# Patient Record
Sex: Male | Born: 1950 | Race: Asian | Hispanic: No | Marital: Married | Smoking: Current every day smoker
Health system: Southern US, Community
[De-identification: ages and names within clinical notes are randomized; demographics above are authoritative.]

## PROBLEM LIST (undated history)

## (undated) DIAGNOSIS — E785 Hyperlipidemia, unspecified: Secondary | ICD-10-CM

## (undated) DIAGNOSIS — I10 Essential (primary) hypertension: Secondary | ICD-10-CM

---

## 2010-02-05 ENCOUNTER — Encounter: Payer: Self-pay | Admitting: Gastroenterology

## 2010-03-01 ENCOUNTER — Encounter (INDEPENDENT_AMBULATORY_CARE_PROVIDER_SITE_OTHER): Payer: Self-pay | Admitting: *Deleted

## 2010-03-09 ENCOUNTER — Ambulatory Visit: Payer: Self-pay | Admitting: Gastroenterology

## 2010-03-19 ENCOUNTER — Ambulatory Visit: Payer: Self-pay | Admitting: Gastroenterology

## 2010-05-29 NOTE — Miscellaneous (Signed)
Summary: previsit prep/rm  Clinical Lists Changes  Medications: Added new medication of MOVIPREP 100 GM  SOLR (PEG-KCL-NACL-NASULF-NA ASC-C) As per prep instructions. - Signed Rx of MOVIPREP 100 GM  SOLR (PEG-KCL-NACL-NASULF-NA ASC-C) As per prep instructions.;  #1 x 0;  Signed;  Entered by: Sherren Kerns RN;  Authorized by: Mardella Layman MD [facility];  Method used: Electronically to [id number]*, [address] [place], [place], [place]  [address], Ph: [phone], Fax: [phone] Observations: Added new observation of ALLERGY REV: Done (03/09/2010 8:55) Added new observation of NKA: T (03/09/2010 8:55)    Prescriptions: MOVIPREP 100 GM  SOLR (PEG-KCL-NACL-NASULF-NA ASC-C) As per prep instructions.  #1 x 0   Entered by:   Sherren Kerns RN   Authorized by:   Mardella Layman MD [facility]   Signed by:   Sherren Kerns RN on 03/09/2010   Method used:   Electronically to        [id number]* (retail)       [address]       [place], [place]  [address]       Ph: [phone]       Fax: [phone]   RxID:   [id number]

## 2010-05-29 NOTE — Letter (Signed)
Summary: Moviprep Instructions  [facility]  [address]   [place], [place] [address]   Phone: [phone]  Fax: [phone]       [facility]    [birth year]    MRN: [id number]        Procedure Day /Date: Monday, 03-19-10     Arrival Time: 7:30 a.m.     Procedure Time: 8:30 a.m.     Location of Procedure:                    x   [facility] (4th Floor)                        PREPARATION FOR COLONOSCOPY WITH MOVIPREP   Starting 5 days prior to your procedure  03-14-10 do not eat nuts, seeds, popcorn, corn, beans, peas,  salads, or any raw vegetables.  Do not take any fiber supplements (e.g. Metamucil, Citrucel, and Benefiber).  THE DAY BEFORE YOUR PROCEDURE         DATE: 03-18-10   DAY: Sunday 1.  Drink clear liquids the entire day-NO SOLID FOOD  2.  Do not drink anything colored red or purple.  Avoid juices with pulp.  No orange juice.  3.  Drink at least 64 oz. (8 glasses) of fluid/clear liquids during the day to prevent dehydration and help the prep work efficiently.  CLEAR LIQUIDS INCLUDE: Water Jello Ice Popsicles Tea (sugar ok, no milk/cream) Powdered fruit flavored drinks Coffee (sugar ok, no milk/cream) Gatorade Juice: apple, white grape, white cranberry  Lemonade Clear bullion, consomm, broth Carbonated beverages (any kind) Strained chicken noodle soup Hard Candy                             4.  In the morning, mix first dose of MoviPrep solution:    Empty 1 Pouch A and 1 Pouch B into the disposable container    Add lukewarm drinking water to the top line of the container. Mix to dissolve    Refrigerate (mixed solution should be used within 24 hrs)  5.  Begin drinking the prep at 5:00 p.m. The MoviPrep container is divided by 4 marks.   Every 15 minutes drink the solution down to the next mark (approximately 8 oz) until the full liter is complete.   6.  Follow completed prep with 16 oz of clear liquid of your choice  (Nothing red or purple).  Continue to drink clear liquids until bedtime.  7.  Before going to bed, mix second dose of MoviPrep solution:    Empty 1 Pouch A and 1 Pouch B into the disposable container    Add lukewarm drinking water to the top line of the container. Mix to dissolve    Refrigerate  THE DAY OF YOUR PROCEDURE      DATE: 03-19-10  DAY: Monday  Beginning at 3:30 a.m. (5 hours before procedure):         1. Every 15 minutes, drink the solution down to the next mark (approx 8 oz) until the full liter is complete.  2. Follow completed prep with 16 oz. of clear liquid of your choice.    3. You may drink clear liquids until 6:30 a.m. (2 HOURS BEFORE PROCEDURE).   MEDICATION INSTRUCTIONS  Unless otherwise instructed, you should take regular prescription medications with a small sip of water   as early as possible the  morning of your procedure.         OTHER INSTRUCTIONS  You will need a responsible adult at least 62 years of age to accompany you and drive you home.   This person must remain in the waiting room during your procedure.  Wear loose fitting clothing that is easily removed.  Leave jewelry and other valuables at home.  However, you may wish to bring a book to read or  an iPod/MP3 player to listen to music as you wait for your procedure to start.  Remove all body piercing jewelry and leave at home.  Total time from sign-in until discharge is approximately 2-3 hours.  You should go home directly after your procedure and rest.  You can resume normal activities the  day after your procedure.  The day of your procedure you should not:   Drive   Make legal decisions   Operate machinery   Drink alcohol   Return to work  You will receive specific instructions about eating, activities and medications before you leave.    The above instructions have been reviewed and explained to me by   Sherren Kerns RN  March 09, 2010 9:15 AM    I fully  understand and can verbalize these instructions _____________________________ Date _________

## 2010-05-29 NOTE — Procedures (Signed)
Summary: Colonoscopy  Patient: [name] Note: All result statuses are Final unless otherwise noted.  Tests: (1) Colonoscopy (COL)   COL Colonoscopy           DONE     [facility]     [address]     [place], [place]  [address]           COLONOSCOPY PROCEDURE REPORT           PATIENT:  Frederick Andrews, Frederick Andrews  MR#:  [id number]     BIRTHDATE:  1951/01/07, 58 yrs. old  GENDER:  male     ENDOSCOPIST:  Vania Rea. Jarold Motto, MD, [facility]     REF. BY:  Derrek Gu, M.D.     PROCEDURE DATE:  03/19/2010     PROCEDURE:  Colonoscopy with snare polypectomy     ASA CLASS:  Class I     INDICATIONS:  Routine Risk Screening     MEDICATIONS:   Fentanyl 50 mcg, Versed 4 mg           DESCRIPTION OF PROCEDURE:   After the risks benefits and     alternatives of the procedure were thoroughly explained, informed     consent was obtained.  Digital rectal exam was performed and     revealed no abnormalities.   The LB CF-[id number] endoscope     was introduced through the anus and advanced to the cecum, which     was identified by both the appendix and ileocecal valve, without     limitations.  The quality of the prep was excellent, using     MoviPrep.  The instrument was then slowly withdrawn as the colon     was fully examined.     <<PROCEDUREIMAGES>>           FINDINGS:  A sessile polyp was found. 3 mm polyp cold snare     excised.  This was otherwise a normal examination of the colon.     Retroflexed views in the rectum revealed no abnormalities.    The     scope was then withdrawn from the patient and the procedure     completed.           COMPLICATIONS:  None     ENDOSCOPIC IMPRESSION:     1) Sessile polyp     2) Otherwise normal examination     PROBABLE ADENOMA.     RECOMMENDATIONS:     1) Repeat Colonoscopy in 5 years.     REPEAT EXAM:  No           ______________________________     Vania Rea. Jarold Motto, MD, [facility]           CC:           n.     eSIGNED:   Vania Rea. Patterson at  03/19/2010 09:02 AM           Sela Hua, [phone]  Note: An exclamation mark (!) indicates a result that was not dispersed into the flowsheet. Document Creation Date: 03/19/2010 9:02 AM _______________________________________________________________________  (1) Order result status: Final Collection or observation date-time: 03/19/2010 08:55 Requested date-time:  Receipt date-time:  Reported date-time:  Referring Physician:   Ordering Physician: Sheryn Bison [phone]) Specimen Source:  Source: Launa Grill Order Number: [id number] Lab site:   Appended Document: Colonoscopy     Procedures Next Due Date:    Colonoscopy: 02/2015

## 2010-05-29 NOTE — Letter (Signed)
Summary: Pre Visit Letter Revised  [facility]  [address] [place], [place] [address]   Phone: [phone]  Fax: [phone]        02/05/2010 MRN: [id number]  [facility] [address] [place], [place]  [address]             Procedure Date:  11-21 at 8:30am   Welcome to the Gastroenterology Division at [facility].    You are scheduled to see a nurse for your pre-procedure visit on 11-7 -11 at 1pm on the 3rd floor at [facility], [address]  We ask that you try to arrive at our office 15 minutes prior to your appointment time to allow for check-in.  Please take a minute to review the attached form.  If you answer "Yes" to one or more of the questions on the first page, we ask that you call the person listed at your earliest opportunity.  If you answer "No" to all of the questions, please complete the rest of the form and bring it to your appointment.    Your nurse visit will consist of discussing your medical and surgical history, your immediate family medical history, and your medications.   If you are unable to list all of your medications on the form, please bring the medication bottles to your appointment and we will list them.  We will need to be aware of both prescribed and over the counter drugs.  We will need to know exact dosage information as well.    Please be prepared to read and sign documents such as consent forms, a financial agreement, and acknowledgement forms.  If necessary, and with your consent, a friend or relative is welcome to sit-in on the nurse visit with you.  Please bring your insurance card so that we may make a copy of it.  If your insurance requires a referral to see a specialist, please bring your referral form from your primary care physician.  No co-pay is required for this nurse visit.     If you cannot keep your appointment, please call [phone] to cancel or reschedule prior to your appointment date.  This allows  [place] the opportunity to schedule an appointment for another patient in need of care.    Thank you for choosing [facility] for your medical needs.  We appreciate the opportunity to care for you.  Please visit [place] at our website  to learn more about our practice.  Sincerely, The Gastroenterology Division

## 2010-12-23 ENCOUNTER — Emergency Department (HOSPITAL_BASED_OUTPATIENT_CLINIC_OR_DEPARTMENT_OTHER)
Admission: EM | Admit: 2010-12-23 | Discharge: 2010-12-23 | Disposition: A | Discharge status: Home / Self Care | Payer: PRIVATE HEALTH INSURANCE | Attending: Emergency Medicine | Admitting: Emergency Medicine

## 2010-12-23 ENCOUNTER — Encounter: Payer: Self-pay | Admitting: *Deleted

## 2010-12-23 ENCOUNTER — Emergency Department (INDEPENDENT_AMBULATORY_CARE_PROVIDER_SITE_OTHER): Payer: PRIVATE HEALTH INSURANCE

## 2010-12-23 DIAGNOSIS — S91309A Unspecified open wound, unspecified foot, initial encounter: Secondary | ICD-10-CM | POA: Insufficient documentation

## 2010-12-23 DIAGNOSIS — S91311A Laceration without foreign body, right foot, initial encounter: Secondary | ICD-10-CM

## 2010-12-23 DIAGNOSIS — W268XXA Contact with other sharp object(s), not elsewhere classified, initial encounter: Secondary | ICD-10-CM | POA: Insufficient documentation

## 2010-12-23 DIAGNOSIS — Y92009 Unspecified place in unspecified non-institutional (private) residence as the place of occurrence of the external cause: Secondary | ICD-10-CM | POA: Insufficient documentation

## 2010-12-23 HISTORY — DX: Essential (primary) hypertension: I10

## 2010-12-23 MED ORDER — DOXYCYCLINE HYCLATE 100 MG PO CAPS: 100.0000 mg | ORAL_CAPSULE | Freq: Two times a day (BID) | ORAL | Status: DC

## 2010-12-23 MED ORDER — DOXYCYCLINE HYCLATE 100 MG PO CAPS: 100.0000 mg | ORAL_CAPSULE | Freq: Two times a day (BID) | ORAL | Status: AC

## 2010-12-23 MED ORDER — CEPHALEXIN 500 MG PO CAPS: 500.0000 mg | ORAL_CAPSULE | Freq: Four times a day (QID) | ORAL | Status: AC

## 2010-12-23 MED ORDER — CEPHALEXIN 250 MG PO CAPS
500.0000 mg | ORAL_CAPSULE | Freq: Once | ORAL | Status: AC
  Administered 2010-12-23: 500 mg via ORAL
  Filled 2010-12-23: qty 2

## 2010-12-23 MED ORDER — LIDOCAINE HCL 2 % IJ SOLN
INTRAMUSCULAR | Status: AC
  Administered 2010-12-23: 22:00:00
  Filled 2010-12-23: qty 1

## 2010-12-23 MED ORDER — DOXYCYCLINE HYCLATE 100 MG PO TABS
100.0000 mg | ORAL_TABLET | Freq: Once | ORAL | Status: AC
  Administered 2010-12-23: 100 mg via ORAL
  Filled 2010-12-23: qty 1

## 2010-12-23 MED ORDER — CEPHALEXIN 250 MG PO CAPS
ORAL_CAPSULE | ORAL | Status: AC
  Filled 2010-12-23: qty 1

## 2010-12-23 MED ORDER — HYDROCODONE-ACETAMINOPHEN 5-325 MG PO TABS: 1.0000 | ORAL_TABLET | ORAL | Status: AC | PRN

## 2010-12-23 MED ORDER — HYDROCODONE-ACETAMINOPHEN 5-325 MG PO TABS
1.0000 | ORAL_TABLET | Freq: Once | ORAL | Status: AC
  Administered 2010-12-23: 1 via ORAL
  Filled 2010-12-23: qty 1

## 2010-12-23 MED ORDER — CEPHALEXIN 500 MG PO CAPS: 500.0000 mg | ORAL_CAPSULE | Freq: Four times a day (QID) | ORAL | Status: DC

## 2010-12-23 NOTE — ED Notes (Signed)
Pt had glass from door to fall on foot. Has bled through several bandages and bandage was reimbursed by C. Deeann Cree RN.

## 2010-12-23 NOTE — ED Notes (Signed)
Wound not assessed at this time d/t dsg needs to remain for xr

## 2010-12-23 NOTE — ED Provider Notes (Signed)
History    Scribed for Carleene Cooper III, MD, the patient was seen in room MH11/MH11. This chart was scribed by Katha Cabal. This patient's care was started at 8:55PM.    CSN: [id number] Arrival date & time: 12/23/2010  7:08 PM  Chief Complaint  Patient presents with  . Extremity Laceration   The history is provided by a relative. A language interpreter was used.   Timonthy Hovater is a 60 y.o. male who presents to the Emergency Department complaining of right foot laceration with associated moderate bleeding.   The patient has a history of HTN.  Family adds that the patient has been nauseated and and his blood pressure was elevated when taken by an family member who is a Charity fundraiser.  Patient kicked a glass door because it was stuck and then the glass broke and fell on his foot.  Tetanus UTD.   History is provided by the family.  Daughter translated to patient in [place]    HPI ELEMENTS:  Location: right foot, dorsal overlaying the 4th and 5th metatarsal  bones  Onset: tonight Duration: bleeding persistent since onset Context:  as above  Associated symptoms: bleeding from right foot, nausea, elevated BP  PAST MEDICAL HISTORY:  Past Medical History  Diagnosis Date  . Hypertension     PAST SURGICAL HISTORY:  History reviewed. No pertinent past surgical history.  MEDICATIONS:  Previous Medications   ASPIRIN EC 81 MG TABLET    Take 81 mg by mouth daily.       ALLERGIES:  Allergies as of 12/23/2010  . (No Known Allergies)     FAMILY HISTORY:  History reviewed. No pertinent family history.  Brother died from Vitamin B1 shot.   SOCIAL HISTORY: History   Social History  . Marital Status: Legally Separated    Spouse Name: N/A    Number of Children: N/A  . Years of Education: N/A   Social History Main Topics  . Smoking status: Never Smoker   . Smokeless tobacco: None  . Alcohol Use: No  . Drug Use: No  . Sexually Active:    Other Topics Concern  . None   Social History  Narrative  . None    Review of Systems 10 Systems reviewed and are negative for acute change except as noted in the HPI.  Physical Exam  BP 171/71  Pulse 67  Temp(Src) 98.4 F (36.9 C) (Oral)  Resp 20  Ht 5\' 5"  (1.651 m)  Wt 122 lb (55.339 kg)  BMI 20.30 kg/m2  SpO2 98%  Physical Exam  Nursing note and vitals reviewed. Constitutional: He is oriented to person, place, and time. He appears well-developed and well-nourished.  HENT:  Head: Normocephalic and atraumatic.  Eyes: EOM are normal. Pupils are equal, round, and reactive to light.  Neck: Normal range of motion. Neck supple.  Cardiovascular: Normal rate and regular rhythm.   No murmur heard. Pulmonary/Chest: Effort normal and breath sounds normal. He has no wheezes.  Abdominal: Soft. Bowel sounds are normal. There is no tenderness.  Musculoskeletal: Normal range of motion.       No back deformity.  Pt is able to move toes.   4 cm angulated distally based flap that cuts through the muscles, tendons intact, venous bleeding from wound   Neurological: He is alert and oriented to person, place, and time.       No sensory of tendon loss in right foot  Skin: Skin is warm and dry.  Psychiatric: He has  a normal mood and affect. His behavior is normal.    ED Course  Procedures OTHER DATA REVIEWED: Nursing notes, vital signs, and past medical records reviewed.   DIAGNOSTIC STUDIES: Oxygen Saturation is 98% on room air, normal by my interpretation.     LABS / RADIOLOGY:  No results found for this or any previous visit.   Dg Foot Complete Right  12/23/2010  *RADIOLOGY REPORT*  Clinical Data: Laceration to right foot with glass  RIGHT FOOT COMPLETE - 3+ VIEW  Comparison: None.  Findings: There is extensive artifact due to placement of a bandage which obscures fine detail.  No radiopaque foreign body is identified.  There is suggestion of soft tissue lucency over the heel seen on the oblique view, which could indicate soft  tissue laceration.  No fracture or dislocation is identified.  IMPRESSION: Allowing for overlying bandage material, no radiopaque foreign body.  Possible laceration noted over the soft tissues of the heel.  Original Report Authenticated By: Harrel Lemon, M.D.    PROCEDURES: LACERATION REPAIR PROCEDURE NOTE The patient's identification was confirmed and consent was obtained. This procedure was performed by Carleene Cooper III, MD at 9:26 PM. Site: dorsal right foot overlaying the 4th and 5th metatarsal bones Sterile procedures observed Anesthetic used: 10 cc 2% Lidocaine  Suture type/size: 5.0 Vicryl in subcutaneous tissue and  Suture type/size 2: 11, 4.0 Prolene in the skin Length: 4 cm  # of Sutures: 4 in subcutaneous tissue # of Sutures 2: 11 in the skin Technique: Simple Interrupted  Antibx ointment applied Tetanus UTD or ordered Site anesthetized, irrigated with NS, explored without evidence of foreign body, wound well approximated, site covered with dry, sterile dressing.  Patient tolerated procedure well without complications. Instructions for care discussed verbally and patient provided with additional written instructions for homecare and f/u.     ED COURSE / COORDINATION OF CARE:  8:55PM PE complete.  Discussed laceration repair procedure with family.   10:15PM  Laceration repair complete.  Spoke with family about the need to elevate foot while resting and use of crutches while walking.  Advised family regarding wound care and importance of the wound remaining dry.  Also, told patient to decrease ASA regimen for the next couples days to prevent further bleeding.   Return in 2 days for wound recheck and dressing change.  Wrote note for work absent this week.  Family agreed.     Orders Placed This Encounter  Procedures  . DG Foot Complete Right  . Crutches    MDM:     I personally performed the services described in this documentation, which was scribed in my  presence. The recorded information has been reviewed and considered. No att. providers found  Carleene Cooper III, M.D.  Carleene Cooper III, MD 12/24/10 1225

## 2010-12-23 NOTE — ED Notes (Signed)
Wound care provided, drg. Applied.

## 2010-12-27 ENCOUNTER — Encounter (HOSPITAL_BASED_OUTPATIENT_CLINIC_OR_DEPARTMENT_OTHER): Payer: Self-pay | Admitting: *Deleted

## 2010-12-27 ENCOUNTER — Emergency Department (HOSPITAL_BASED_OUTPATIENT_CLINIC_OR_DEPARTMENT_OTHER)
Admission: EM | Admit: 2010-12-27 | Discharge: 2010-12-27 | Disposition: A | Discharge status: Home / Self Care | Payer: PRIVATE HEALTH INSURANCE | Attending: Emergency Medicine | Admitting: Emergency Medicine

## 2010-12-27 DIAGNOSIS — S91319A Laceration without foreign body, unspecified foot, initial encounter: Secondary | ICD-10-CM

## 2010-12-27 DIAGNOSIS — I10 Essential (primary) hypertension: Secondary | ICD-10-CM | POA: Insufficient documentation

## 2010-12-27 DIAGNOSIS — Z09 Encounter for follow-up examination after completed treatment for conditions other than malignant neoplasm: Secondary | ICD-10-CM | POA: Insufficient documentation

## 2010-12-27 NOTE — ED Notes (Signed)
Here for wound recheck right foot. Dsg intact on arrival from 3 days ago. Dsg removed without difficulty. Sutures intact. Foot is swollen. Some bruising noted. No complaints.

## 2010-12-27 NOTE — ED Provider Notes (Signed)
History    patient presents with his son for a wound check. The patient suffered a laceration to his right foot 5 days ago, and was seen in this ED, with suture repair at that point. He denies interval fevers, chills, nausea, vomiting, new erythema, new injuries, or any distal numbness weakness or tingling. He has not undressed the wound at home.  CSN: [id number] Arrival date & time: 12/27/2010 11:11 AM  Chief Complaint  Patient presents with  . Wound Check   HPI  Past Medical History  Diagnosis Date  . Hypertension     History reviewed. No pertinent past surgical history.  No family history on file.  History  Substance Use Topics  . Smoking status: Never Smoker   . Smokeless tobacco: Not on file  . Alcohol Use: No      Review of Systems  All other systems reviewed and are negative.    Physical Exam  BP 156/74  Pulse 63  Temp(Src) 97.7 F (36.5 C) (Oral)  Resp 20  SpO2 99%  Physical Exam  Constitutional: He appears well-developed. No distress.  HENT:  Head: Normocephalic and atraumatic.  Musculoskeletal:       The right foot has a laceration on the lateral forefoot, there are irregular contours with what appears to be a ladder and superficial sutures, mild amount of blood still extruding through the suture line, no surrounding erythema beyond what is expected for this type of injury. No pus. Pulses sensation and motor are all intact in the remainder of the extremity.    ED Course  Procedures  MDM Patient presenting for a wound check 5 days after the initial injury. The absence of fevers, chills, new erythema, pus, or new complaints his reassuring. The wound itself, is reasonably well-appearing, the sutures are not ready for removal. Patient was advised to return in 4 days for another wound check, and possible suture removal. He is also given explicit return precautions, to his son the interpreter.      Gerhard Munch, MD 12/27/10 1143

## 2010-12-27 NOTE — ED Notes (Signed)
I cleaned wounds with saline and hydrogen peroxide wipes, then I applied heavy bacitracin to wounds, then applied 4x4's and kerlix, tape to secure.

## 2012-09-28 ENCOUNTER — Ambulatory Visit: Payer: PRIVATE HEALTH INSURANCE | Admitting: Family Medicine

## 2012-11-17 ENCOUNTER — Ambulatory Visit: Payer: PRIVATE HEALTH INSURANCE | Admitting: Family Medicine

## 2012-12-29 ENCOUNTER — Ambulatory Visit: Payer: PRIVATE HEALTH INSURANCE | Admitting: Family Medicine

## 2014-10-28 ENCOUNTER — Other Ambulatory Visit: Payer: Self-pay | Admitting: Acute Care

## 2014-10-28 DIAGNOSIS — Z87891 Personal history of nicotine dependence: Secondary | ICD-10-CM

## 2014-11-07 ENCOUNTER — Telehealth: Payer: Self-pay | Admitting: Acute Care

## 2014-11-07 NOTE — Telephone Encounter (Signed)
I called and left a message reminding Chauncy PassyVivian Lee, the patient's daughter who is his interpreter, of the appointments on 11/09/14 for Lung Cancer Screening , and additionally to remember to bring the patient's Medicare Card to the appointment.

## 2014-11-09 ENCOUNTER — Encounter: Payer: Self-pay | Admitting: Acute Care

## 2014-11-09 ENCOUNTER — Ambulatory Visit (INDEPENDENT_AMBULATORY_CARE_PROVIDER_SITE_OTHER)
Admission: RE | Admit: 2014-11-09 | Discharge: 2014-11-09 | Disposition: A | Discharge status: Home / Self Care | Payer: Medicare Other | Source: Ambulatory Visit | Attending: Acute Care | Admitting: Acute Care

## 2014-11-09 ENCOUNTER — Ambulatory Visit (INDEPENDENT_AMBULATORY_CARE_PROVIDER_SITE_OTHER): Payer: Medicare Other | Admitting: Acute Care

## 2014-11-09 ENCOUNTER — Encounter (INDEPENDENT_AMBULATORY_CARE_PROVIDER_SITE_OTHER): Payer: Self-pay

## 2014-11-09 DIAGNOSIS — Z87891 Personal history of nicotine dependence: Secondary | ICD-10-CM | POA: Diagnosis not present

## 2014-11-09 IMAGING — CT CT CHEST LUNG CANCER SCREENING LOW DOSE W/O CM
2 of 6 series · 15 of 40 positions shown, 18 images · non-contrast
Comparison: None.

CLINICAL DATA: Forty pack-year smoking history.

EXAM:
CT CHEST WITHOUT CONTRAST
TECHNIQUE: Multidetector CT imaging of the chest was performed following the
standard protocol without IV contrast.

[Series 5: lungs thins for pacs · axial · 0.60mm/px · z∈[-278,+8]mm · 12 of 321 slices shown, 15 images]
[im 18/321  mediastinal]
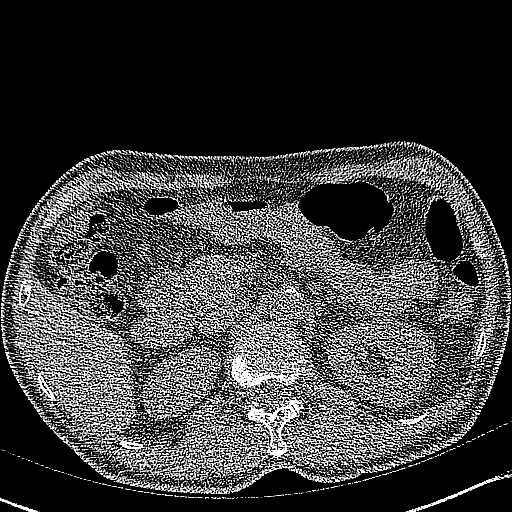
[im 18/321  lung]
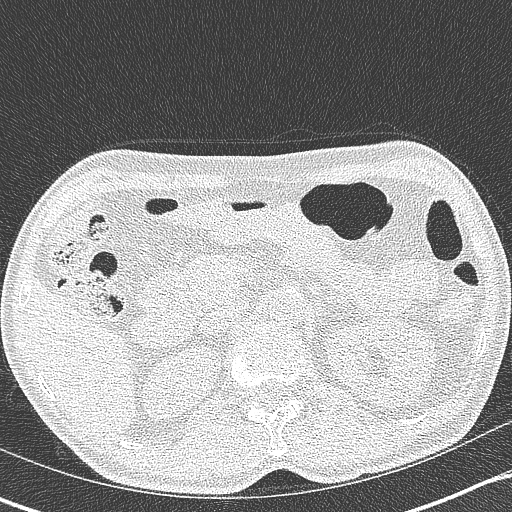
[im 54/321  lung]
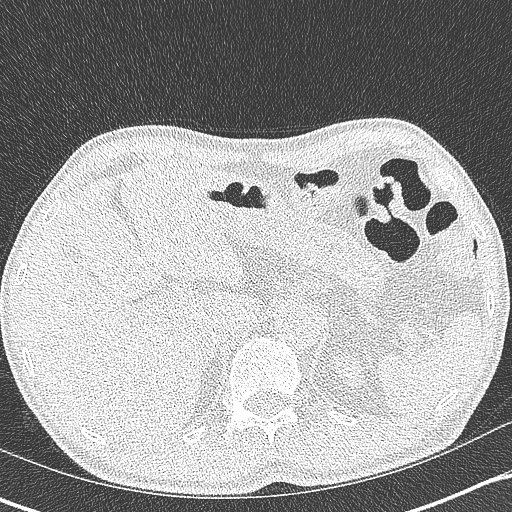
[im 72/321  lung]
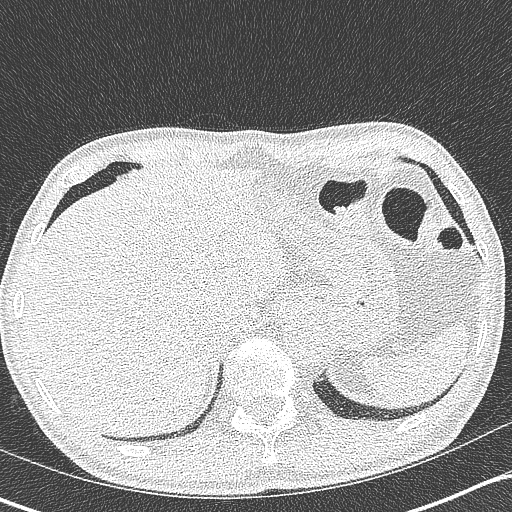
[im 89/321  lung]
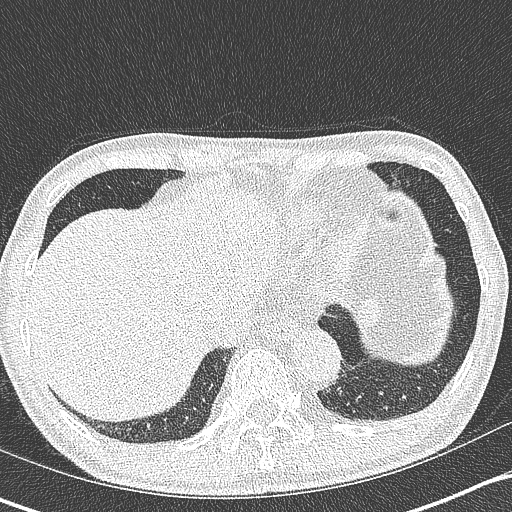
[im 125/321  mediastinal]
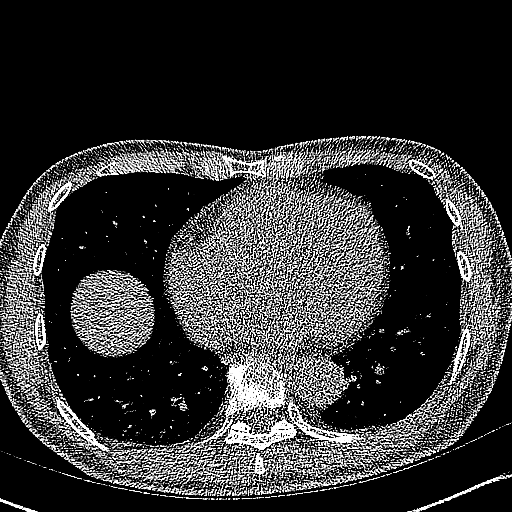
[im 125/321  lung]
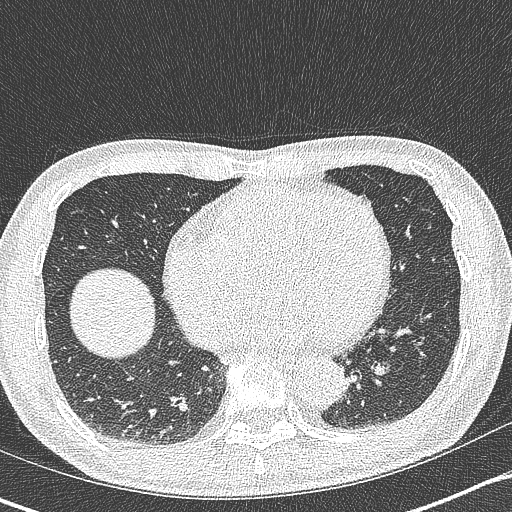
[im 143/321  lung]
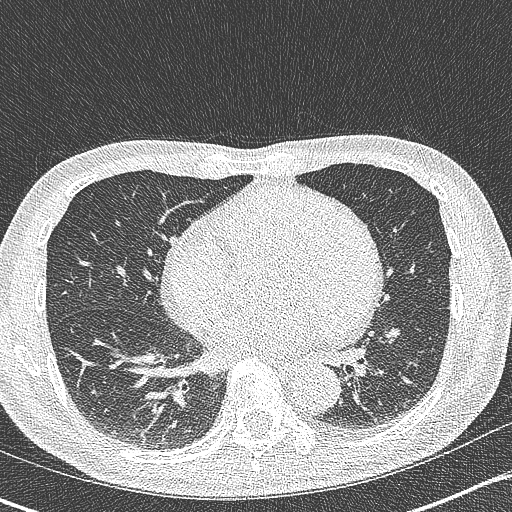
[im 178/321  lung]
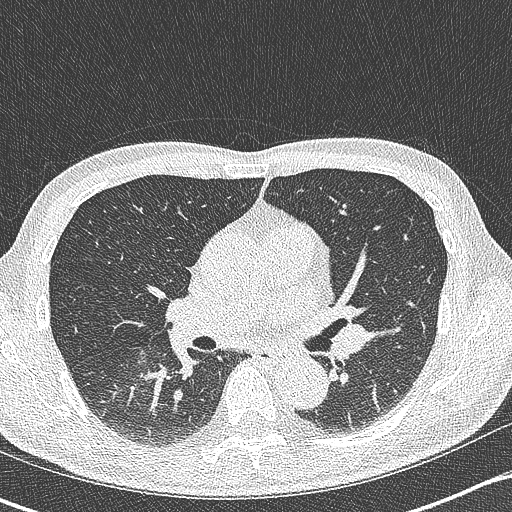
[im 196/321  lung]
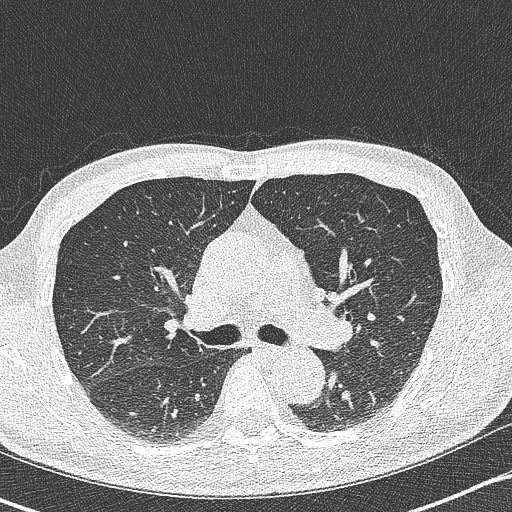
[im 232/321  mediastinal]
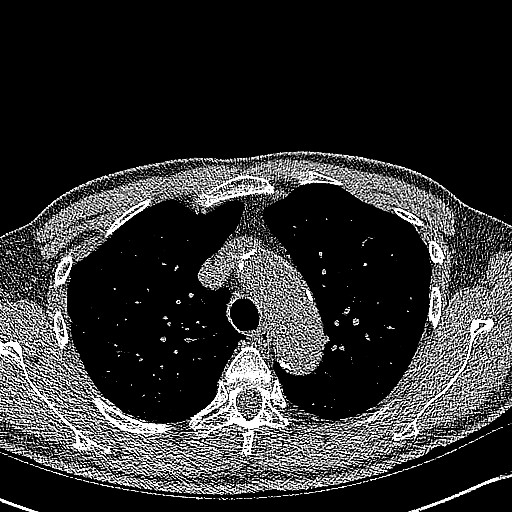
[im 232/321  lung]
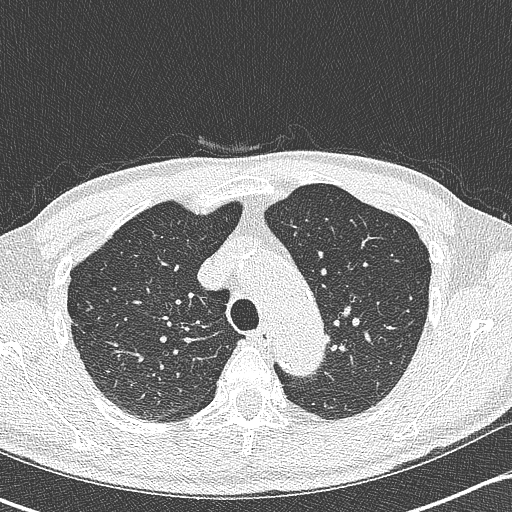
[im 249/321  lung]
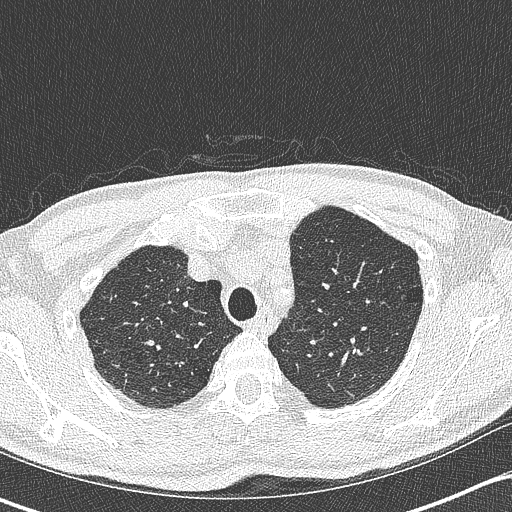
[im 267/321  lung]
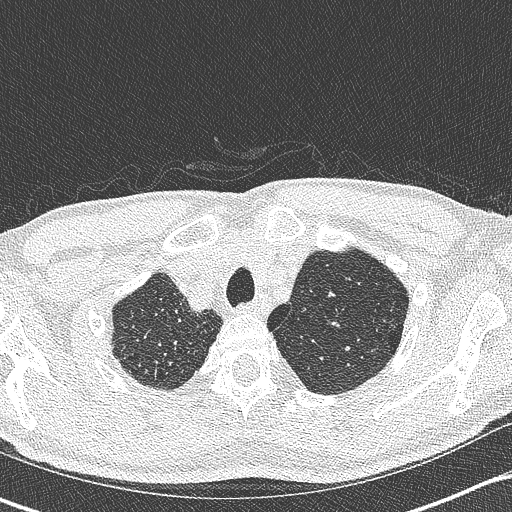
[im 303/321  lung]
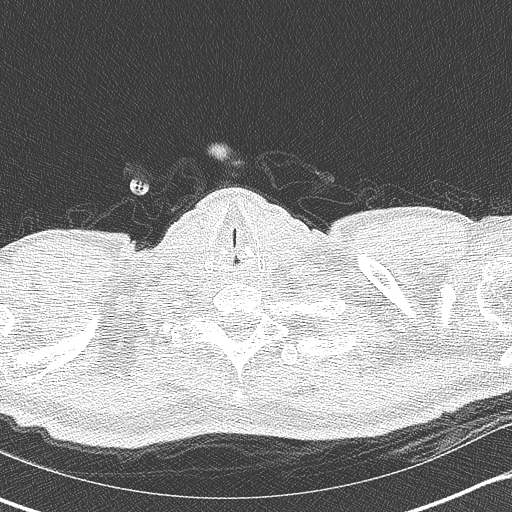

[Series 602: cor · coronal · 0.63mm/px · 3 of 109 slices shown]
[im 22/109  lung]
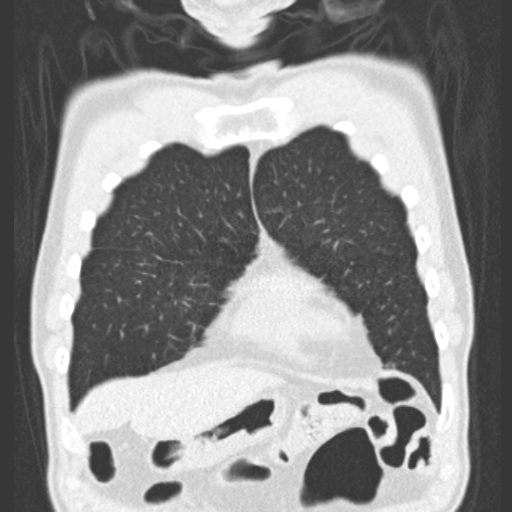
[im 44/109  lung]
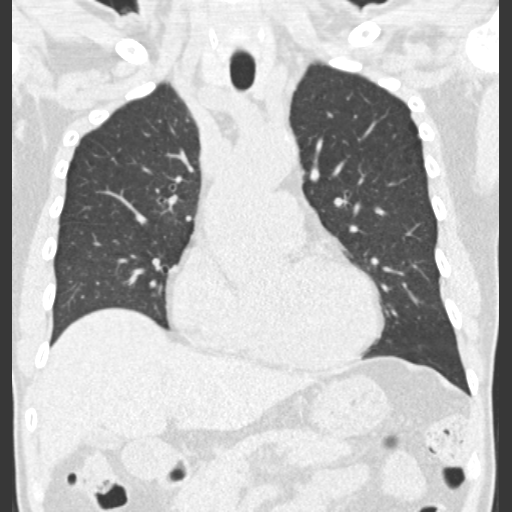
[im 65/109  lung]
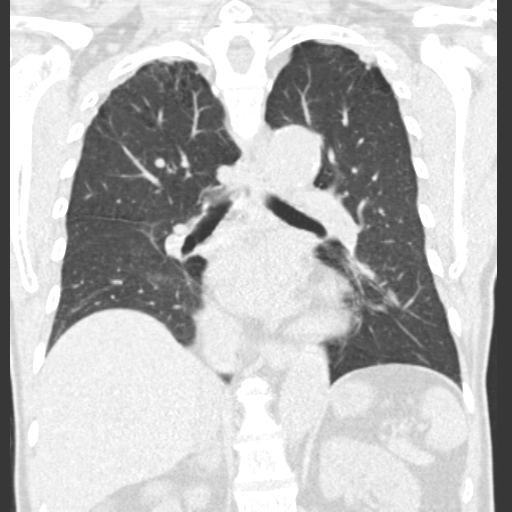

[15 of 40 positions shown; findings below may reference images not displayed]

FINDINGS: Mediastinum/Nodes: advanced aortic and branch vessel
atherosclerosis. Tortuous descending thoracic aorta. Mild
cardiomegaly. LAD coronary artery atherosclerosis. No mediastinal or
definite hilar adenopathy, given limitations of unenhanced CT.

Lungs/Pleura: No pleural fluid. Calcified granuloma in the left
upper lobe. Mild centrilobular emphysema. No suspicious pulmonary
nodule.

Upper abdomen: Normal imaged portions of the liver, spleen, stomach,
pancreas, adrenal glands, kidneys, and gallbladder.

Musculoskeletal: Mild lower thoracic spondylosis. No acute osseous
abnormality.
IMPRESSION: 1. Mild centrilobular emphysema, without suspicious pulmonary
nodule. Lung-RADS Category 1, negative. Continue annual screening
with low-dose chest CT without contrast in 12 months.
2.  Atherosclerosis, including within the coronary arteries.

## 2014-11-09 NOTE — Progress Notes (Signed)
Shared Decision Making Visit Lung Cancer Screening Program [phone]([id number])   Eligibility:  Age 89 y.o.  Pack Years Smoking History Calculation :44 pack years (# packs/per year x # years smoked)  Recent History of coughing up blood  no  Unexplained weight loss? no ( >Than 15 pounds within the last 6 months )  Prior History Lung / other cancer no (Diagnosis within the last 5 years already requiring surveillance chest CT Scans).  Smoking Status Current Smoker   Quit Date: NA  Visit Components:  Discussion included one or more decision making aids. yes  Discussion included risk/benefits of screening. yes  Discussion included potential follow up diagnostic testing for abnormal scans. yes  Discussion included meaning and risk of over diagnosis. yes  Discussion included meaning and risk of False Positives. yes  Discussion included meaning of total radiation exposure. yes  Counseling Included:  Importance of adherence to annual lung cancer LDCT screening. yes  Impact of comorbidities on ability to participate in the program. yes  Ability and willingness to under diagnostic treatment. yes  Smoking Cessation Counseling:  Current Smokers:   Discussed importance of smoking cessation. yes  Information about tobacco cessation classes and interventions provided to patient. yes  Patient provided with "ticket" for LDCT Scan. yes  Symptomatic Patient. no  Counseling  Diagnosis Code: Tobacco Use Z72.0  Asymptomatic Patient yes  Counseling (Intermediate counseling: > three minutes counseling) [id number]  Former Smokers:   Discussed the importance of maintaining cigarette abstinence. NA  Diagnosis Code: Personal History of Nicotine Dependence. U98.119Z87.891  Information about tobacco cessation classes and interventions provided to patient. Yes  Patient provided with "ticket" for LDCT Scan. yes  Written Order for Lung Cancer Screening with LDCT placed in Epic. Yes (CT Chest Lung  Cancer Screening Low Dose W/O CM) [id number] Z12.2-Screening of respiratory organs Z87.891-Personal history of nicotine dependence   I spent 15 minutes of face to face time with [name] and his daughter Frederick Andrews. She found out about us through the lung cancer screening website. This is a self referral. [name] does not speak English, so his daughter was here as an Equities traderinterpreter.We viewed a power point discussing the risks and benefits of the Lung Cancer Screening Program.We stopped after each slide to allow Frederick Andrews to interpret the information to [name]. He asked questions which Frederick Andrews then asked me, and we provided the answer to him through Frederick Andrews of my response. We addressed all risks and benefits of the program as stated above. We had dialogue, and allowed time to ask and answer questions.I told [name] through his daughter, that the single most powerful action he can take to decrease his risk of developing lung cancer was to quit smoking. He verbalized understanding to his daughter and to me. We discussed setting small realistic goals , and that decreasing the amount he smokes is a move in the right direction. He verbalized understanding. He is actively trying to quit smoking. He does not want any nicotine replacement medications. I gave him the " Be stronger than your excuses" card and reminded him to let me know if there was anything I could do to help him meet his goal of quitting. He verbalized understanding to his daughter, who communicated this to me. Frederick Andrews is taking Frederick Andrews to the [place] CT scan for his LDCT. She has the address and verbalized understanding of the time and location of the scan. I also spoke with [name] and his daughter about the importance  of having regular check ups and establishing himself with a primary care physician, as he does not see a PCP at this time. Both he and Frederick Andrews verbalized understanding , and I referred them to the [facility] group for  a PCP.         Frederick Ngo, NP

## 2014-11-10 ENCOUNTER — Telehealth: Payer: Self-pay | Admitting: Acute Care

## 2014-11-10 NOTE — Telephone Encounter (Signed)
I called and spoke with Chauncy PassyVivian Lee, Mr. Cathlyn ParsonsDoak's daughter. Her father is non-English speaking and she acts as his interpreter. I explained that her father's LDCT results resulted in a Lung RADS 1 category: No nodules and definitely benign nodules. I explained that the recommendation was for a repeat annual scan in 12 months. She verbalized understanding and is going to share this information with her father. I told her to call me back if he has any further questions.I also told her that if her father's health changes, and he has unexplained weight loss or starts to cough up blood to seek medical care immediately. She verbalized understanding.She has my contact information.

## 2014-11-19 ENCOUNTER — Emergency Department (HOSPITAL_BASED_OUTPATIENT_CLINIC_OR_DEPARTMENT_OTHER)
Admission: EM | Admit: 2014-11-19 | Discharge: 2014-11-19 | Disposition: A | Discharge status: Home / Self Care | Payer: Medicare Other | Attending: Emergency Medicine | Admitting: Emergency Medicine

## 2014-11-19 ENCOUNTER — Encounter (HOSPITAL_BASED_OUTPATIENT_CLINIC_OR_DEPARTMENT_OTHER): Payer: Self-pay | Admitting: *Deleted

## 2014-11-19 DIAGNOSIS — I10 Essential (primary) hypertension: Secondary | ICD-10-CM | POA: Insufficient documentation

## 2014-11-19 DIAGNOSIS — Z720 Tobacco use: Secondary | ICD-10-CM | POA: Diagnosis not present

## 2014-11-19 DIAGNOSIS — G4452 New daily persistent headache (NDPH): Secondary | ICD-10-CM

## 2014-11-19 DIAGNOSIS — Z7982 Long term (current) use of aspirin: Secondary | ICD-10-CM | POA: Insufficient documentation

## 2014-11-19 DIAGNOSIS — R51 Headache: Secondary | ICD-10-CM | POA: Insufficient documentation

## 2014-11-19 MED ORDER — HYDROCODONE-ACETAMINOPHEN 5-325 MG PO TABS: 1.0000 | ORAL_TABLET | Freq: Four times a day (QID) | ORAL | Status: DC | PRN

## 2014-11-19 MED ORDER — HYDROCODONE-ACETAMINOPHEN 5-325 MG PO TABS
2.0000 | ORAL_TABLET | Freq: Once | ORAL | Status: AC
  Administered 2014-11-19: 2 via ORAL
  Filled 2014-11-19: qty 2

## 2014-11-19 NOTE — ED Provider Notes (Signed)
CSN: [id number]     Arrival date & time 11/19/14  1235 History   First MD Initiated Contact with Patient 11/19/14 1342     Chief Complaint  Patient presents with  . Headache    Patient speaks little [place].  professional interpreteroffered to patient. He declined. We used his adult daughter who is fluent in [place] and [place] as interpreter instead at patient's preference (Consider location/radiation/quality/duration/timing/severity/associated sxs/prior Treatment) HPI Complains ofheadache occipital in location and nonradiating throbbing in nature gradual onset 3 days ago. He vehemently denies vision change or blurred vision. Pain is worse with lying supine improved with sitting up. No focal numbness or weakness. Seen at urgent care center prior to coming here today. Sent here for further evaluation. He's been treated with Tylenol with partial relief of pain. No other associated symptoms. No fever no head trauma no other associated symptoms Past Medical History  Diagnosis Date  . Hypertension    History reviewed. No pertinent past surgical history. History reviewed. No pertinent family history. History  Substance Use Topics  . Smoking status: Current Every Day Smoker -- 1.00 packs/day for 40 years    Types: Cigarettes  . Smokeless tobacco: Not on file     Comment: Working on decreasing the number of cigarettes per day.  . Alcohol Use: No  no illicit drug use  Review of Systems  Constitutional: Negative.   Respiratory: Negative.   Cardiovascular: Negative.   Gastrointestinal: Negative.   Musculoskeletal: Negative.   Skin: Negative.   Neurological: Positive for headaches.  Psychiatric/Behavioral: Negative.   All other systems reviewed and are negative.     Allergies  Review of patient's allergies indicates no known allergies.  Home Medications   Prior to Admission medications   Medication Sig Start Date End Date Taking? Authorizing Provider  aspirin EC 81 MG tablet  Take 81 mg by mouth daily.      Historical Provider, MD   BP 157/70 mmHg  Pulse 69  Temp(Src) 98.4 F (36.9 C) (Oral)  Resp 18  Ht 5\' 3"  (1.6 m)  Wt 123 lb (55.792 kg)  BMI 21.79 kg/m2  SpO2 100% Physical Exam  Constitutional: He is oriented to person, place, and time. He appears well-developed and well-nourished. No distress.  HENT:  Head: Normocephalic and atraumatic.  Right Ear: External ear normal.  Left Ear: External ear normal.  Bilateral tympanic membranes normal  Eyes: Conjunctivae are normal. Pupils are equal, round, and reactive to light.  Fundi benign  Neck: Neck supple. No tracheal deviation present. No thyromegaly present.  No signs of meningitis  Cardiovascular: Normal rate and regular rhythm.   No murmur heard. Pulmonary/Chest: Effort normal and breath sounds normal.  Abdominal: Soft. Bowel sounds are normal. He exhibits no distension. There is no tenderness.  Musculoskeletal: Normal range of motion. He exhibits no edema or tenderness.  Neurological: He is alert and oriented to person, place, and time. No cranial nerve deficit. Coordination normal.  DTRs symmetric bilaterally at knee jerk ankle jerk and biceps toes downgoing bilaterally gait normal Romberg normal pronator drift normal  Skin: Skin is warm and dry. No rash noted.  Psychiatric: He has a normal mood and affect.  Nursing note and vitals reviewed.   ED Course  Procedures (including critical care time) Labs Review Labs Reviewed - No data to display  Imaging Review No results found.   EKG Interpretation None    consultation for 5 minutes on smoking cessation  MDM  I don't feel that brain  imaging is needed emergently.headache is felt to be nonspecific presently Plan prescription Norco.blood pressure recheck 3 weeks. Referral [facility].  Dx #1 headache #2 tobacco abuse #3 hypertension Final diagnoses:  None        Doug Sou, MD 11/19/14 1436

## 2014-11-19 NOTE — ED Notes (Signed)
Pt c/o h/a  , blurred vision  x 17 hrs , [facility]

## 2014-11-19 NOTE — Discharge Instructions (Signed)
Headaches, Frequently Asked Questions Call the [facility] and wellness Center in 2 days to arrange to get a primary care physician. Ask your primary care physician to help you to stop smoking. He should also be followed for your blood pressure. Get your blood pressure check within the next 3 weeks. Today's was elevated at 156/70. Take the pain medicine prescribed for bad pain or Tylenol for mild pain. Don't take Tylenol together with the pain medicine prescribed as the combination can be dangerous. MIGRAINE HEADACHES Q: What is migraine? What causes it? How can I treat it? A: Generally, migraine headaches begin as a dull ache. Then they develop into a constant, throbbing, and pulsating pain. You may experience pain at the temples. You may experience pain at the front or back of one or both sides of the head. The pain is usually accompanied by a combination of:  Nausea.  Vomiting.  Sensitivity to light and noise. Some people (about 15%) experience an aura (see below) before an attack. The cause of migraine is believed to be chemical reactions in the brain. Treatment for migraine may include over-the-counter or prescription medications. It may also include self-help techniques. These include relaxation training and biofeedback.  Q: What is an aura? A: About 15% of people with migraine get an "aura". This is a sign of neurological symptoms that occur before a migraine headache. You may see wavy or jagged lines, dots, or flashing lights. You might experience tunnel vision or blind spots in one or both eyes. The aura can include visual or auditory hallucinations (something imagined). It may include disruptions in smell (such as strange odors), taste or touch. Other symptoms include:  Numbness.  A "pins and needles" sensation.  Difficulty in recalling or speaking the correct word. These neurological events may last as long as 60 minutes. These symptoms will fade as the headache begins. Q: What is a  trigger? A: Certain physical or environmental factors can lead to or "trigger" a migraine. These include:  Foods.  Hormonal changes.  Weather.  Stress. It is important to remember that triggers are different for everyone. To help prevent migraine attacks, you need to figure out which triggers affect you. Keep a headache diary. This is a good way to track triggers. The diary will help you talk to your healthcare professional about your condition. Q: Does weather affect migraines? A: Bright sunshine, hot, humid conditions, and drastic changes in barometric pressure may lead to, or "trigger," a migraine attack in some people. But studies have shown that weather does not act as a trigger for everyone with migraines. Q: What is the link between migraine and hormones? A: Hormones start and regulate many of your body's functions. Hormones keep your body in balance within a constantly changing environment. The levels of hormones in your body are unbalanced at times. Examples are during menstruation, pregnancy, or menopause. That can lead to a migraine attack. In fact, about three quarters of all women with migraine report that their attacks are related to the menstrual cycle.  Q: Is there an increased risk of stroke for migraine sufferers? A: The likelihood of a migraine attack causing a stroke is very remote. That is not to say that migraine sufferers cannot have a stroke associated with their migraines. In persons under age 12, the most common associated factor for stroke is migraine headache. But over the course of a person's normal life span, the occurrence of migraine headache may actually be associated with a reduced risk of  dying from cerebrovascular disease due to stroke.  Q: What are acute medications for migraine? A: Acute medications are used to treat the pain of the headache after it has started. Examples over-the-counter medications, NSAIDs, ergots, and triptans.  Q: What are the  triptans? A: Triptans are the newest class of abortive medications. They are specifically targeted to treat migraine. Triptans are vasoconstrictors. They moderate some chemical reactions in the brain. The triptans work on receptors in your brain. Triptans help to restore the balance of a neurotransmitter called serotonin. Fluctuations in levels of serotonin are thought to be a main cause of migraine.  Q: Are over-the-counter medications for migraine effective? A: Over-the-counter, or "OTC," medications may be effective in relieving mild to moderate pain and associated symptoms of migraine. But you should see your caregiver before beginning any treatment regimen for migraine.  Q: What are preventive medications for migraine? A: Preventive medications for migraine are sometimes referred to as "prophylactic" treatments. They are used to reduce the frequency, severity, and length of migraine attacks. Examples of preventive medications include antiepileptic medications, antidepressants, beta-blockers, calcium channel blockers, and NSAIDs (nonsteroidal anti-inflammatory drugs). Q: Why are anticonvulsants used to treat migraine? A: During the past few years, there has been an increased interest in antiepileptic drugs for the prevention of migraine. They are sometimes referred to as "anticonvulsants". Both epilepsy and migraine may be caused by similar reactions in the brain.  Q: Why are antidepressants used to treat migraine? A: Antidepressants are typically used to treat people with depression. They may reduce migraine frequency by regulating chemical levels, such as serotonin, in the brain.  Q: What alternative therapies are used to treat migraine? A: The term "alternative therapies" is often used to describe treatments considered outside the scope of conventional Western medicine. Examples of alternative therapy include acupuncture, acupressure, and yoga. Another common alternative treatment is herbal  therapy. Some herbs are believed to relieve headache pain. Always discuss alternative therapies with your caregiver before proceeding. Some herbal products contain arsenic and other toxins. TENSION HEADACHES Q: What is a tension-type headache? What causes it? How can I treat it? A: Tension-type headaches occur randomly. They are often the result of temporary stress, anxiety, fatigue, or anger. Symptoms include soreness in your temples, a tightening band-like sensation around your head (a "vice-like" ache). Symptoms can also include a pulling feeling, pressure sensations, and contracting head and neck muscles. The headache begins in your forehead, temples, or the back of your head and neck. Treatment for tension-type headache may include over-the-counter or prescription medications. Treatment may also include self-help techniques such as relaxation training and biofeedback. CLUSTER HEADACHES Q: What is a cluster headache? What causes it? How can I treat it? A: Cluster headache gets its name because the attacks come in groups. The pain arrives with little, if any, warning. It is usually on one side of the head. A tearing or bloodshot eye and a runny nose on the same side of the headache may also accompany the pain. Cluster headaches are believed to be caused by chemical reactions in the brain. They have been described as the most severe and intense of any headache type. Treatment for cluster headache includes prescription medication and oxygen. SINUS HEADACHES Q: What is a sinus headache? What causes it? How can I treat it? A: When a cavity in the bones of the face and skull (a sinus) becomes inflamed, the inflammation will cause localized pain. This condition is usually the result of an allergic reaction, a  tumor, or an infection. If your headache is caused by a sinus blockage, such as an infection, you will probably have a fever. An x-ray will confirm a sinus blockage. Your caregiver's treatment might  include antibiotics for the infection, as well as antihistamines or decongestants.  REBOUND HEADACHES Q: What is a rebound headache? What causes it? How can I treat it? A: A pattern of taking acute headache medications too often can lead to a condition known as "rebound headache." A pattern of taking too much headache medication includes taking it more than 2 days per week or in excessive amounts. That means more than the label or a caregiver advises. With rebound headaches, your medications not only stop relieving pain, they actually begin to cause headaches. Doctors treat rebound headache by tapering the medication that is being overused. Sometimes your caregiver will gradually substitute a different type of treatment or medication. Stopping may be a challenge. Regularly overusing a medication increases the potential for serious side effects. Consult a caregiver if you regularly use headache medications more than 2 days per week or more than the label advises. ADDITIONAL QUESTIONS AND ANSWERS Q: What is biofeedback? A: Biofeedback is a self-help treatment. Biofeedback uses special equipment to monitor your body's involuntary physical responses. Biofeedback monitors:  Breathing.  Pulse.  Heart rate.  Temperature.  Muscle tension.  Brain activity. Biofeedback helps you refine and perfect your relaxation exercises. You learn to control the physical responses that are related to stress. Once the technique has been mastered, you do not need the equipment any more. Q: Are headaches hereditary? A: Four out of five (80%) of people that suffer report a family history of migraine. Scientists are not sure if this is genetic or a family predisposition. Despite the uncertainty, a child has a 50% chance of having migraine if one parent suffers. The child has a 75% chance if both parents suffer.  Q: Can children get headaches? A: By the time they reach high school, most young people have experienced some  type of headache. Many safe and effective approaches or medications can prevent a headache from occurring or stop it after it has begun.  Q: What type of doctor should I see to diagnose and treat my headache? A: Start with your primary caregiver. Discuss his or her experience and approach to headaches. Discuss methods of classification, diagnosis, and treatment. Your caregiver may decide to recommend you to a headache specialist, depending upon your symptoms or other physical conditions. Having diabetes, allergies, etc., may require a more comprehensive and inclusive approach to your headache. The National Headache Foundation will provide, upon request, a list of [facility] physician members in your state. Document Released: 07/06/2003 Document Revised: 07/08/2011 Document Reviewed: 12/14/2007 [facility] Patient Information 2015 [place], [place]. This information is not intended to replace advice given to you by your health care provider. Make sure you discuss any questions you have with your health care provider.

## 2015-03-07 ENCOUNTER — Encounter: Payer: Self-pay | Admitting: Internal Medicine

## 2015-07-18 ENCOUNTER — Other Ambulatory Visit: Payer: Self-pay | Admitting: Acute Care

## 2015-07-18 DIAGNOSIS — F17210 Nicotine dependence, cigarettes, uncomplicated: Secondary | ICD-10-CM

## 2015-11-29 ENCOUNTER — Telehealth: Payer: Self-pay | Admitting: Acute Care

## 2015-11-29 NOTE — Telephone Encounter (Signed)
Frederick Andrews I have tried to contact this patient on 06/16, 06/19, 07/05, and 11/10/2015 to get the 1 year follow up CT scheduled for this patient. I even called his son Bethann Berkshire on 11/01/15 and he stated they were on vacation and to call back on 11/10/15. When I called back on 11/10/15 I had to leave a message and to this day no one has called me back to schedule the patient's CT. Do you want me to cancel the CT order?

## 2015-11-30 ENCOUNTER — Telehealth: Payer: Self-pay | Admitting: Acute Care

## 2015-11-30 NOTE — Telephone Encounter (Signed)
I called in an attempt to schedule this patient for his annual low dose CT scan. The office has made Multiple attempts ( 4 ) to contact the family regarding follow up scanning, without success. ( See notation below from [employer]. [place]). I have left a message on the voice mail of the only number we have for the patient requesting his daughter call us to schedule Frederick Andrews follow up scan. I also explained to her that if they no longer want to participate in the program, which requires annual scanning to maintain eligibility, to please call and let us know of this decision also.We will await a call from the family.m Of note this patient's scan was read as a Lung RADS 1 last July 17 th..   Documentation of Follow Up calls. Frederick Andrews I have tried to contact this patient on 06/16, 06/19, 07/05, and 11/10/2015 to get the 1 year follow up CT scheduled for this patient. I even called his son Bethann Berkshire on 11/01/15 and he stated they were on vacation and to call back on 11/10/15. When I called back on 11/10/15 I had to leave a message and to this day no one has called me back to schedule the patient's CT. Do you want me to cancel the CT order?

## 2015-12-19 ENCOUNTER — Ambulatory Visit: Payer: Medicare Other

## 2015-12-25 ENCOUNTER — Ambulatory Visit: Payer: Medicare Other

## 2015-12-27 ENCOUNTER — Ambulatory Visit: Payer: Medicare Other

## 2016-05-30 DIAGNOSIS — Z720 Tobacco use: Secondary | ICD-10-CM | POA: Diagnosis present

## 2016-05-30 DIAGNOSIS — I10 Essential (primary) hypertension: Secondary | ICD-10-CM | POA: Diagnosis present

## 2016-05-30 DIAGNOSIS — J984 Other disorders of lung: Secondary | ICD-10-CM

## 2016-05-30 DIAGNOSIS — E785 Hyperlipidemia, unspecified: Secondary | ICD-10-CM | POA: Diagnosis present

## 2019-06-21 ENCOUNTER — Ambulatory Visit: Payer: Medicare Other

## 2019-06-30 ENCOUNTER — Other Ambulatory Visit: Payer: Self-pay | Admitting: Physician Assistant

## 2019-06-30 DIAGNOSIS — Z0000 Encounter for general adult medical examination without abnormal findings: Secondary | ICD-10-CM

## 2019-06-30 DIAGNOSIS — Z136 Encounter for screening for cardiovascular disorders: Secondary | ICD-10-CM

## 2019-06-30 DIAGNOSIS — Z87891 Personal history of nicotine dependence: Secondary | ICD-10-CM

## 2019-08-30 ENCOUNTER — Encounter: Payer: Self-pay | Admitting: Physician Assistant

## 2020-06-09 LAB — COLOGUARD: COLOGUARD: NEGATIVE

## 2021-04-27 DIAGNOSIS — G8929 Other chronic pain: Secondary | ICD-10-CM | POA: Insufficient documentation

## 2022-11-22 ENCOUNTER — Other Ambulatory Visit: Payer: Self-pay

## 2022-11-22 ENCOUNTER — Emergency Department (HOSPITAL_COMMUNITY): Payer: 59

## 2022-11-22 ENCOUNTER — Emergency Department (HOSPITAL_COMMUNITY)
Admission: EM | Admit: 2022-11-22 | Discharge: 2022-11-22 | Disposition: A | Discharge status: Home / Self Care | Payer: 59 | Source: Home / Self Care | Attending: Emergency Medicine | Admitting: Emergency Medicine

## 2022-11-22 ENCOUNTER — Encounter (HOSPITAL_COMMUNITY): Payer: Self-pay | Admitting: *Deleted

## 2022-11-22 DIAGNOSIS — R000 Tachycardia, unspecified: Secondary | ICD-10-CM | POA: Insufficient documentation

## 2022-11-22 DIAGNOSIS — M25512 Pain in left shoulder: Secondary | ICD-10-CM | POA: Insufficient documentation

## 2022-11-22 DIAGNOSIS — Z1152 Encounter for screening for COVID-19: Secondary | ICD-10-CM | POA: Insufficient documentation

## 2022-11-22 DIAGNOSIS — R7881 Bacteremia: Secondary | ICD-10-CM | POA: Diagnosis not present

## 2022-11-22 DIAGNOSIS — M25511 Pain in right shoulder: Secondary | ICD-10-CM | POA: Insufficient documentation

## 2022-11-22 DIAGNOSIS — Z7982 Long term (current) use of aspirin: Secondary | ICD-10-CM | POA: Insufficient documentation

## 2022-11-22 DIAGNOSIS — I10 Essential (primary) hypertension: Secondary | ICD-10-CM | POA: Insufficient documentation

## 2022-11-22 DIAGNOSIS — A4151 Sepsis due to Escherichia coli [E. coli]: Secondary | ICD-10-CM | POA: Diagnosis not present

## 2022-11-22 HISTORY — DX: Hyperlipidemia, unspecified: E78.5

## 2022-11-22 LAB — COMPREHENSIVE METABOLIC PANEL
ALT: 32 U/L (ref 0–44)
AST: 28 U/L (ref 15–41)
Albumin: 3.3 g/dL — ABNORMAL LOW (ref 3.5–5.0)
Alkaline Phosphatase: 162 U/L — ABNORMAL HIGH (ref 38–126)
Anion gap: 16 — ABNORMAL HIGH (ref 5–15)
BUN: 36 mg/dL — ABNORMAL HIGH (ref 8–23)
CO2: 17 mmol/L — ABNORMAL LOW (ref 22–32)
Calcium: 9 mg/dL (ref 8.9–10.3)
Chloride: 102 mmol/L (ref 98–111)
Creatinine, Ser: 1.13 mg/dL (ref 0.61–1.24)
GFR, Estimated: >60 mL/min (ref >60)
Glucose, Bld: 103 mg/dL — ABNORMAL HIGH (ref 70–99)
Potassium: 4.1 mmol/L (ref 3.5–5.1)
Sodium: 135 mmol/L (ref 135–145)
Total Bilirubin: 1.8 mg/dL — ABNORMAL HIGH (ref 0.3–1.2)
Total Protein: 8.1 g/dL (ref 6.5–8.1)

## 2022-11-22 LAB — CBC WITH DIFFERENTIAL/PLATELET
Abs Immature Granulocytes: 0.2 10*3/uL — ABNORMAL HIGH (ref 0.00–0.07)
Basophils Absolute: 0.1 10*3/uL (ref 0.0–0.1)
Basophils Relative: 1 %
Eosinophils Absolute: 0 10*3/uL (ref 0.0–0.5)
Eosinophils Relative: 0 %
HCT: 41.8 % (ref 39.0–52.0)
Hemoglobin: 13.8 g/dL (ref 13.0–17.0)
Immature Granulocytes: 2 %
Lymphocytes Relative: 9 %
Lymphs Abs: 0.8 10*3/uL (ref 0.7–4.0)
MCH: 28.6 pg (ref 26.0–34.0)
MCHC: 33 g/dL (ref 30.0–36.0)
MCV: 86.7 fL (ref 80.0–100.0)
Monocytes Absolute: 0.7 10*3/uL (ref 0.1–1.0)
Monocytes Relative: 7 %
Neutro Abs: 7.9 10*3/uL — ABNORMAL HIGH (ref 1.7–7.7)
Neutrophils Relative %: 81 %
Platelets: 122 10*3/uL — ABNORMAL LOW (ref 150–400)
RBC: 4.82 MIL/uL (ref 4.22–5.81)
RDW: 13.9 % (ref 11.5–15.5)
WBC: 9.6 10*3/uL (ref 4.0–10.5)
nRBC: 0.2 % (ref 0.0–0.2)

## 2022-11-22 LAB — URINALYSIS, W/ REFLEX TO CULTURE (INFECTION SUSPECTED)
Bacteria, UA: NONE SEEN
Bilirubin Urine: NEGATIVE
Glucose, UA: NEGATIVE mg/dL
Ketones, ur: 20 mg/dL — AB
Leukocytes,Ua: NEGATIVE
Nitrite: NEGATIVE
Protein, ur: 30 mg/dL — AB
Specific Gravity, Urine: 1.02 (ref 1.005–1.030)
pH: 5 (ref 5.0–8.0)

## 2022-11-22 LAB — CULTURE, BLOOD (ROUTINE X 2): Special Requests: ADEQUATE

## 2022-11-22 LAB — I-STAT CG4 LACTIC ACID, ED
Lactic Acid, Venous: 1.8 mmol/L (ref 0.5–1.9)
Lactic Acid, Venous: 0.9 mmol/L (ref 0.5–1.9)

## 2022-11-22 LAB — SARS CORONAVIRUS 2 BY RT PCR: SARS Coronavirus 2 by RT PCR: NEGATIVE

## 2022-11-22 MED ORDER — ACETAMINOPHEN 500 MG PO TABS
1000.0000 mg | ORAL_TABLET | Freq: Once | ORAL | Status: AC
  Administered 2022-11-22: 1000 mg via ORAL
  Filled 2022-11-22: qty 2

## 2022-11-22 NOTE — ED Provider Notes (Signed)
I provided a substantive portion of the care of this patient.  I personally made/approved the management plan for this patient and take responsibility for the patient management.      72 year old patient presents with myalgias subjective fevers for 24 hours.  He has had some headache and some neck pain but denies any photophobia.  No urinary symptoms or abdominal pain.  On exam, patient alert and oriented.  Questionable neck stiffness.  Will likely need to have LP.  Will reassess after labs and x-rays result   Lorre Nick, MD 11/22/22 1329

## 2022-11-22 NOTE — Discharge Instructions (Signed)
Please follow-up with your primary care provider in the next 2 days to be reevaluated.  I have also attached a primary care provider for you if you do not have 1.  Today your labs and imaging are reassuring and you most likely have a muscle strain causing your pain.  Please continue take Tylenol every 6 hours as needed for pain and muscle relaxers that have been prescribed to you.  If symptoms change or worsen please return to ER.

## 2022-11-22 NOTE — ED Provider Notes (Signed)
[place] EMERGENCY DEPARTMENT AT [facility] Provider Note   CSN: [id number] Arrival date & time: 11/22/22  1113     History  Chief Complaint  Patient presents with   Shoulder Pain    Frederick Andrews is a 72 y.o. male history of hypertension, hyperlipidemia presented with neck pain for the past week.  History was obtained through son and daughter.  Son states that patient was lifting objects last Sunday in which he hurt his right shoulder and has been seen for this and given muscle relaxers.  Son and daughter state however over the past day patient has become more confused and in which he is not making sense.  Both state that patient fell this morning and landed on his left shoulder and now the patient's left shoulder is hurting.  Patient is stated that his neck has been hurting over the past day as well and that he has been having fevers but has not taken a temperature.  Patient and children denied chest pain, shortness of breath, LOC, blood thinners, abdominal pain, nausea vomiting, shortness of breath, change in sensation/motor skills  Home Medications Prior to Admission medications   Medication Sig Start Date End Date Taking? Authorizing Provider  aspirin EC 81 MG tablet Take 81 mg by mouth daily.      [provider]  HYDROcodone-acetaminophen (NORCO) 5-325 MG per tablet Take 1-2 tablets by mouth every 6 (six) hours as needed for severe pain. 11/19/14   Doug Sou, MD      Allergies    Patient has no known allergies.    Review of Systems   Review of Systems See HPI Physical Exam Updated Vital Signs BP 133/69   Pulse 84   Temp 97.8 F (36.6 C) (Oral)   Resp 18   Ht 5\' 3"  (1.6 m)   Wt 55.8 kg   SpO2 95%   BMI 21.79 kg/m  Physical Exam Constitutional:      Appearance: He is ill-appearing.  Neck:     Comments: Decreased range of motion Unable to touch chin to chest due to pain Positive Kernig's sign Cardiovascular:     Rate and Rhythm: Regular  rhythm. Tachycardia present.     Heart sounds: Normal heart sounds.  Pulmonary:     Effort: Pulmonary effort is normal. No respiratory distress.     Breath sounds: Normal breath sounds.  Abdominal:     Palpations: Abdomen is soft.     Tenderness: There is no abdominal tenderness. There is no guarding or rebound.  Musculoskeletal:     Comments: Full active range of motion bilateral shoulders  Skin:    General: Skin is warm and dry.     Capillary Refill: Capillary refill takes less than 2 seconds.     Findings: No rash.  Neurological:     Mental Status: He is alert and oriented to person, place, and time.     Comments: Vision grossly intact Cranial nerves III through XII intact Unable to assess gait     ED Results / Procedures / Treatments   Labs (all labs ordered are listed, but only abnormal results are displayed) Labs Reviewed  COMPREHENSIVE METABOLIC PANEL - Abnormal; Notable for the following components:      Result Value   CO2 17 (*)    Glucose, Bld 103 (*)    BUN 36 (*)    Albumin 3.3 (*)    Alkaline Phosphatase 162 (*)    Total Bilirubin 1.8 (*)  Anion gap 16 (*)    All other components within normal limits  CBC WITH DIFFERENTIAL/PLATELET - Abnormal; Notable for the following components:   Platelets 122 (*)    Neutro Abs 7.9 (*)    Abs Immature Granulocytes 0.20 (*)    All other components within normal limits  URINALYSIS, W/ REFLEX TO CULTURE (INFECTION SUSPECTED) - Abnormal; Notable for the following components:   Color, Urine AMBER (*)    APPearance HAZY (*)    Hgb urine dipstick SMALL (*)    Ketones, ur 20 (*)    Protein, ur 30 (*)    All other components within normal limits  SARS CORONAVIRUS 2 BY RT PCR  CULTURE, BLOOD (ROUTINE X 2)  CULTURE, BLOOD (ROUTINE X 2)  URINE CULTURE  I-STAT CG4 LACTIC ACID, ED  I-STAT CG4 LACTIC ACID, ED    EKG None  Radiology CT Head Wo Contrast  Result Date: 11/22/2022 CLINICAL DATA:  falls; Neck pain, chronic  falls, neck pain. EXAM: CT HEAD WITHOUT CONTRAST CT CERVICAL SPINE WITHOUT CONTRAST TECHNIQUE: Multidetector CT imaging of the head and cervical spine was performed following the standard protocol without intravenous contrast. Multiplanar CT image reconstructions of the cervical spine were also generated. RADIATION DOSE REDUCTION: This exam was performed according to the departmental dose-optimization program which includes automated exposure control, adjustment of the mA and/or kV according to patient size and/or use of iterative reconstruction technique. COMPARISON:  None Available. FINDINGS: CT HEAD FINDINGS Brain: No acute intracranial hemorrhage. Gray-white differentiation is preserved. No hydrocephalus or extra-axial collection. No mass effect or midline shift. Vascular: No hyperdense vessel or unexpected calcification. Skull: No calvarial fracture or suspicious bone lesion. Skull base is unremarkable. Sinuses/Orbits: No acute finding. Other: None. CT CERVICAL SPINE FINDINGS Alignment: 3 mm degenerative anterolisthesis of C4 on C5 with focal kyphosis at this level. No traumatic malalignment. Skull base and vertebrae: No acute fracture. Normal craniocervical junction. No suspicious bone lesions. Soft tissues and spinal canal: No prevertebral fluid or swelling. No visible canal hematoma. Disc levels: Multilevel cervical spondylosis, worst at C5-6, where there is mild spinal canal stenosis. Upper chest: Emphysema and scarring in the lung apices. Other: Atherosclerotic calcifications of the carotid bulbs. IMPRESSION: 1. No acute intracranial abnormality. 2. No acute cervical spine fracture or traumatic malalignment. 3. Multilevel cervical spondylosis, worst at C5-6, where there is mild spinal canal stenosis. Electronically Signed   By: Orvan Falconer M.D.   On: 11/22/2022 15:06   CT Cervical Spine Wo Contrast  Result Date: 11/22/2022 CLINICAL DATA:  falls; Neck pain, chronic falls, neck pain. EXAM: CT HEAD  WITHOUT CONTRAST CT CERVICAL SPINE WITHOUT CONTRAST TECHNIQUE: Multidetector CT imaging of the head and cervical spine was performed following the standard protocol without intravenous contrast. Multiplanar CT image reconstructions of the cervical spine were also generated. RADIATION DOSE REDUCTION: This exam was performed according to the departmental dose-optimization program which includes automated exposure control, adjustment of the mA and/or kV according to patient size and/or use of iterative reconstruction technique. COMPARISON:  None Available. FINDINGS: CT HEAD FINDINGS Brain: No acute intracranial hemorrhage. Gray-white differentiation is preserved. No hydrocephalus or extra-axial collection. No mass effect or midline shift. Vascular: No hyperdense vessel or unexpected calcification. Skull: No calvarial fracture or suspicious bone lesion. Skull base is unremarkable. Sinuses/Orbits: No acute finding. Other: None. CT CERVICAL SPINE FINDINGS Alignment: 3 mm degenerative anterolisthesis of C4 on C5 with focal kyphosis at this level. No traumatic malalignment. Skull base and vertebrae: No acute fracture.  Normal craniocervical junction. No suspicious bone lesions. Soft tissues and spinal canal: No prevertebral fluid or swelling. No visible canal hematoma. Disc levels: Multilevel cervical spondylosis, worst at C5-6, where there is mild spinal canal stenosis. Upper chest: Emphysema and scarring in the lung apices. Other: Atherosclerotic calcifications of the carotid bulbs. IMPRESSION: 1. No acute intracranial abnormality. 2. No acute cervical spine fracture or traumatic malalignment. 3. Multilevel cervical spondylosis, worst at C5-6, where there is mild spinal canal stenosis. Electronically Signed   By: Orvan Falconer M.D.   On: 11/22/2022 15:06   DG Chest Port 1 View  Result Date: 11/22/2022 CLINICAL DATA:  Fever. EXAM: PORTABLE CHEST 1 VIEW COMPARISON:  None Available. FINDINGS: Mild cardiomegaly. Minimal  bilateral midlung opacities are noted suggesting focal atelectasis or less likely infiltrates. Bony thorax is unremarkable. IMPRESSION: Minimal bilateral midlung opacities are noted suggesting focal atelectasis or less likely infiltrates. Electronically Signed   By: Lupita Raider M.D.   On: 11/22/2022 14:13   DG Shoulder Right  Result Date: 11/22/2022 CLINICAL DATA:  Pain.  Fall. EXAM: RIGHT SHOULDER - 2+ VIEW COMPARISON:  None Available. FINDINGS: Three views of the right shoulder. Mild acromioclavicular joint space narrowing and peripheral osteophytosis. The glenohumeral joint space is maintained. No acute fracture or dislocation. Mild-to-moderate degenerative disc changes are noted within the thoracic spine. IMPRESSION: Mild acromioclavicular osteoarthritis. Electronically Signed   By: Neita Garnet M.D.   On: 11/22/2022 13:43   DG Shoulder Left  Result Date: 11/22/2022 CLINICAL DATA:  Pain.  Fall. EXAM: LEFT SHOULDER - 2+ VIEW COMPARISON:  None Available. FINDINGS: Frontal and transscapular Y-views of the left shoulder. Mild glenohumeral joint space narrowing. Minimal inferior glenoid degenerative osteophytosis. Mild acromioclavicular subchondral cystic change and peripheral osteophytosis. No acute fracture is seen. No dislocation. Mild-to-moderate atherosclerotic calcifications within the aortic arch. IMPRESSION: Mild glenohumeral and acromioclavicular osteoarthritis. Electronically Signed   By: Neita Garnet M.D.   On: 11/22/2022 13:42    Procedures Procedures    Medications Ordered in ED Medications  acetaminophen (TYLENOL) tablet 1,000 mg (1,000 mg Oral Given 11/22/22 1322)    ED Course/ Medical Decision Making/ A&P                             Medical Decision Making Amount and/or Complexity of Data Reviewed Labs: ordered. Radiology: ordered.  Risk OTC drugs.   Dorrance [age] y.o. presented today for AMS. Working DDx that I considered at this time includes, but not limited to,  CVA, ICH, intracranial mass, critical dehydration, heptatic dysfunction, uremia, hypercarbia, intoxication, endocrine abnormality, toxidrome, meningitis, MSK.  Review of prior external notes: 11/20/2022 office visit  Unique Tests and My Interpretation:  CT Head wo Contrast: No acute changes CT cervical spine Wo contrast: No acute changes Right shoulder x-ray: Arthritis noted Left shoulder x-ray: Arthritis noted CBC: Unremarkable CMP: Unremarkable COVID: Negative UA: Unremarkable Lactic acid: 1.8, 0.9 CXR: Atelectasis EKG: Rate, rhythm, axis, intervals all examined and without medically relevant abnormality. ST segments without concerns for elevations  Discussion with Independent Historian:  Son and daughter  Discussion of Management of Tests: None  Risk: Low: based on diagnostic testing/clinical impression and treatment plan  Risk Stratification Score: None  Staffed with Freida Busman, MD  R/o DDx: CVA, ICH, intracranial mass, critical dehydration, heptatic dysfunction, uremia, hypercarbia, intoxication, endocrine abnormality, toxidrome, meningitis: These diagnoses are not consistent with patient's history/presentation/physical exam/imaging or lab findings  Plan: On exam patient was tachycardic  and does appear ill.  Triage note stated that patient was having shoulder pain however when I went in the room son and daughter stated that they were concerned about patient acting altered.  Patient was neurovascular tact in both upper extremities and did have right trapezius tenderness to palpation however patient was unwilling to move his neck and with the history of fevers at home along with acting altered according to his children there is concern for possible meningitis.  Patient did have positive Kernig sign as well.  Patient be given Tylenol as his rectal temp was 100.1 F which is up from 97.7 previously.  Patient was tachycardic at 102.  Patient's labs came back reassuring.  Patient's imaging  was also reassuring.  Pending patient's urine patient will be discharged with outpatient follow-up as his workup has been reassuring and his urine is unremarkable so we do not suspect any life-threatening diagnoses.  On recheck patient was walking around without issue and stated he felt much better after the Tylenol.  Patient's labs and urine are within the hip and reassuring at this time patient most likely has MSK pain causing his neck/shoulder pain.  Patient was encouraged to use Tylenol every 6 hours as needed for pain and to follow-up with his primary care provider and to continue using the muscle relaxers that have been prescribed to him.  Patient was given return precautions. Patient stable for discharge at this time.  Patient verbalized understanding of plan.         Final Clinical Impression(s) / ED Diagnoses Final diagnoses:  Acute pain of both shoulders    Rx / DC Orders ED Discharge Orders     None         Remi Deter 11/22/22 1607    Lorre Nick, MD 11/24/22 [phone]

## 2022-11-22 NOTE — ED Triage Notes (Signed)
Left shoulder pain, seen at UC was given muscle relaxer's. Pt continues to have pain in shoulder. More noted with movement.

## 2022-11-23 ENCOUNTER — Inpatient Hospital Stay (HOSPITAL_COMMUNITY): Payer: 59

## 2022-11-23 ENCOUNTER — Encounter (HOSPITAL_COMMUNITY): Payer: Self-pay

## 2022-11-23 ENCOUNTER — Inpatient Hospital Stay (HOSPITAL_COMMUNITY)
Admission: EM | Admit: 2022-11-23 | Discharge: 2022-11-29 | DRG: 853 | Disposition: A | Discharge status: Home Health | Payer: 59 | Attending: Internal Medicine | Admitting: Internal Medicine

## 2022-11-23 DIAGNOSIS — K5900 Constipation, unspecified: Secondary | ICD-10-CM | POA: Diagnosis not present

## 2022-11-23 DIAGNOSIS — E8721 Acute metabolic acidosis: Secondary | ICD-10-CM | POA: Diagnosis present

## 2022-11-23 DIAGNOSIS — R7881 Bacteremia: Secondary | ICD-10-CM

## 2022-11-23 DIAGNOSIS — Z8349 Family history of other endocrine, nutritional and metabolic diseases: Secondary | ICD-10-CM

## 2022-11-23 DIAGNOSIS — Z1629 Resistance to other single specified antibiotic: Secondary | ICD-10-CM | POA: Diagnosis present

## 2022-11-23 DIAGNOSIS — I2510 Atherosclerotic heart disease of native coronary artery without angina pectoris: Secondary | ICD-10-CM | POA: Diagnosis present

## 2022-11-23 DIAGNOSIS — D6959 Other secondary thrombocytopenia: Secondary | ICD-10-CM | POA: Diagnosis present

## 2022-11-23 DIAGNOSIS — B9620 Unspecified Escherichia coli [E. coli] as the cause of diseases classified elsewhere: Secondary | ICD-10-CM | POA: Diagnosis present

## 2022-11-23 DIAGNOSIS — E162 Hypoglycemia, unspecified: Secondary | ICD-10-CM | POA: Diagnosis present

## 2022-11-23 DIAGNOSIS — I088 Other rheumatic multiple valve diseases: Secondary | ICD-10-CM | POA: Diagnosis present

## 2022-11-23 DIAGNOSIS — I10 Essential (primary) hypertension: Secondary | ICD-10-CM | POA: Diagnosis present

## 2022-11-23 DIAGNOSIS — I480 Paroxysmal atrial fibrillation: Secondary | ICD-10-CM | POA: Diagnosis not present

## 2022-11-23 DIAGNOSIS — F17210 Nicotine dependence, cigarettes, uncomplicated: Secondary | ICD-10-CM | POA: Diagnosis present

## 2022-11-23 DIAGNOSIS — Z8249 Family history of ischemic heart disease and other diseases of the circulatory system: Secondary | ICD-10-CM

## 2022-11-23 DIAGNOSIS — E785 Hyperlipidemia, unspecified: Secondary | ICD-10-CM | POA: Diagnosis present

## 2022-11-23 DIAGNOSIS — M4622 Osteomyelitis of vertebra, cervical region: Secondary | ICD-10-CM | POA: Diagnosis present

## 2022-11-23 DIAGNOSIS — Z1611 Resistance to penicillins: Secondary | ICD-10-CM | POA: Diagnosis present

## 2022-11-23 DIAGNOSIS — Z1152 Encounter for screening for COVID-19: Secondary | ICD-10-CM

## 2022-11-23 DIAGNOSIS — G061 Intraspinal abscess and granuloma: Secondary | ICD-10-CM | POA: Diagnosis present

## 2022-11-23 DIAGNOSIS — W19XXXA Unspecified fall, initial encounter: Secondary | ICD-10-CM | POA: Diagnosis present

## 2022-11-23 DIAGNOSIS — N390 Urinary tract infection, site not specified: Secondary | ICD-10-CM | POA: Diagnosis present

## 2022-11-23 DIAGNOSIS — Z720 Tobacco use: Secondary | ICD-10-CM | POA: Diagnosis present

## 2022-11-23 DIAGNOSIS — Z79899 Other long term (current) drug therapy: Secondary | ICD-10-CM

## 2022-11-23 DIAGNOSIS — A4151 Sepsis due to Escherichia coli [E. coli]: Secondary | ICD-10-CM | POA: Diagnosis present

## 2022-11-23 DIAGNOSIS — I700 Atherosclerosis of aorta: Secondary | ICD-10-CM | POA: Diagnosis present

## 2022-11-23 DIAGNOSIS — S42295A Other nondisplaced fracture of upper end of left humerus, initial encounter for closed fracture: Secondary | ICD-10-CM | POA: Diagnosis present

## 2022-11-23 DIAGNOSIS — M4802 Spinal stenosis, cervical region: Secondary | ICD-10-CM | POA: Diagnosis present

## 2022-11-23 DIAGNOSIS — M19012 Primary osteoarthritis, left shoulder: Secondary | ICD-10-CM | POA: Diagnosis present

## 2022-11-23 DIAGNOSIS — G062 Extradural and subdural abscess, unspecified: Secondary | ICD-10-CM | POA: Diagnosis present

## 2022-11-23 DIAGNOSIS — M00812 Arthritis due to other bacteria, left shoulder: Secondary | ICD-10-CM | POA: Diagnosis not present

## 2022-11-23 DIAGNOSIS — Z9181 History of falling: Secondary | ICD-10-CM | POA: Diagnosis not present

## 2022-11-23 DIAGNOSIS — N139 Obstructive and reflux uropathy, unspecified: Secondary | ICD-10-CM

## 2022-11-23 DIAGNOSIS — D649 Anemia, unspecified: Secondary | ICD-10-CM | POA: Diagnosis present

## 2022-11-23 DIAGNOSIS — R6520 Severe sepsis without septic shock: Secondary | ICD-10-CM | POA: Diagnosis present

## 2022-11-23 DIAGNOSIS — J984 Other disorders of lung: Secondary | ICD-10-CM

## 2022-11-23 DIAGNOSIS — Z7982 Long term (current) use of aspirin: Secondary | ICD-10-CM

## 2022-11-23 DIAGNOSIS — M47812 Spondylosis without myelopathy or radiculopathy, cervical region: Secondary | ICD-10-CM | POA: Diagnosis present

## 2022-11-23 DIAGNOSIS — M4642 Discitis, unspecified, cervical region: Secondary | ICD-10-CM | POA: Diagnosis present

## 2022-11-23 DIAGNOSIS — L0211 Cutaneous abscess of neck: Secondary | ICD-10-CM | POA: Diagnosis not present

## 2022-11-23 DIAGNOSIS — M00811 Arthritis due to other bacteria, right shoulder: Secondary | ICD-10-CM | POA: Diagnosis not present

## 2022-11-23 LAB — PROTIME-INR
INR: 1 (ref 0.8–1.2)
Prothrombin Time: 13.8 seconds (ref 11.4–15.2)

## 2022-11-23 LAB — BASIC METABOLIC PANEL
Anion gap: 14 (ref 5–15)
BUN: 41 mg/dL — ABNORMAL HIGH (ref 8–23)
CO2: 16 mmol/L — ABNORMAL LOW (ref 22–32)
Calcium: 8 mg/dL — ABNORMAL LOW (ref 8.9–10.3)
Chloride: 104 mmol/L (ref 98–111)
Creatinine, Ser: 1.03 mg/dL (ref 0.61–1.24)
GFR, Estimated: >60 mL/min (ref >60)
Glucose, Bld: 71 mg/dL (ref 70–99)
Potassium: 4 mmol/L (ref 3.5–5.1)
Sodium: 134 mmol/L — ABNORMAL LOW (ref 135–145)

## 2022-11-23 LAB — ECHOCARDIOGRAM COMPLETE
Area-P 1/2: 3.42 cm2
Height: 63 in
P 1/2 time: 416 msec
S' Lateral: 3.2 cm
Weight: 1940.05 oz

## 2022-11-23 LAB — CBC WITH DIFFERENTIAL/PLATELET
Abs Immature Granulocytes: 0.11 10*3/uL — ABNORMAL HIGH (ref 0.00–0.07)
Basophils Absolute: 0 10*3/uL (ref 0.0–0.1)
Basophils Relative: 0 %
Eosinophils Absolute: 0.1 10*3/uL (ref 0.0–0.5)
Eosinophils Relative: 2 %
HCT: 38.6 % — ABNORMAL LOW (ref 39.0–52.0)
Hemoglobin: 12.8 g/dL — ABNORMAL LOW (ref 13.0–17.0)
Immature Granulocytes: 1 %
Lymphocytes Relative: 12 %
Lymphs Abs: 1 10*3/uL (ref 0.7–4.0)
MCH: 28.9 pg (ref 26.0–34.0)
MCHC: 33.2 g/dL (ref 30.0–36.0)
MCV: 87.1 fL (ref 80.0–100.0)
Monocytes Absolute: 0.7 10*3/uL (ref 0.1–1.0)
Monocytes Relative: 9 %
Neutro Abs: 6.7 10*3/uL (ref 1.7–7.7)
Neutrophils Relative %: 76 %
Platelets: 122 10*3/uL — ABNORMAL LOW (ref 150–400)
RBC: 4.43 MIL/uL (ref 4.22–5.81)
RDW: 13.9 % (ref 11.5–15.5)
WBC: 8.7 10*3/uL (ref 4.0–10.5)
nRBC: 0 % (ref 0.0–0.2)

## 2022-11-23 LAB — HEPATIC FUNCTION PANEL
ALT: 29 U/L (ref 0–44)
AST: 25 U/L (ref 15–41)
Albumin: 2.6 g/dL — ABNORMAL LOW (ref 3.5–5.0)
Alkaline Phosphatase: 157 U/L — ABNORMAL HIGH (ref 38–126)
Bilirubin, Direct: 0.6 mg/dL — ABNORMAL HIGH (ref 0.0–0.2)
Indirect Bilirubin: 0.9 mg/dL (ref 0.3–0.9)
Total Bilirubin: 1.5 mg/dL — ABNORMAL HIGH (ref 0.3–1.2)
Total Protein: 6.6 g/dL (ref 6.5–8.1)

## 2022-11-23 LAB — APTT: aPTT: 33 seconds (ref 24–36)

## 2022-11-23 LAB — MAGNESIUM: Magnesium: 2.3 mg/dL (ref 1.7–2.4)

## 2022-11-23 LAB — PHOSPHORUS: Phosphorus: 4.1 mg/dL (ref 2.5–4.6)

## 2022-11-23 MED ORDER — ONDANSETRON HCL 4 MG/2ML IJ SOLN: 4.0000 mg | Freq: Four times a day (QID) | INTRAMUSCULAR | Status: DC | PRN

## 2022-11-23 MED ORDER — ONDANSETRON HCL 4 MG PO TABS: 4.0000 mg | ORAL_TABLET | Freq: Four times a day (QID) | ORAL | Status: DC | PRN

## 2022-11-23 MED ORDER — OXYCODONE HCL 5 MG PO TABS: 5.0000 mg | ORAL_TABLET | Freq: Four times a day (QID) | ORAL | Status: DC | PRN

## 2022-11-23 MED ORDER — ACETAMINOPHEN 500 MG PO TABS
1000.0000 mg | ORAL_TABLET | Freq: Once | ORAL | Status: AC
  Administered 2022-11-23: 1000 mg via ORAL
  Filled 2022-11-23: qty 2

## 2022-11-23 MED ORDER — SODIUM CHLORIDE 0.9 % IV BOLUS
1000.0000 mL | Freq: Once | INTRAVENOUS | Status: AC
  Administered 2022-11-23: 1000 mL via INTRAVENOUS

## 2022-11-23 MED ORDER — LABETALOL HCL 5 MG/ML IV SOLN
10.0000 mg | INTRAVENOUS | Status: DC | PRN
  Administered 2022-11-24: 10 mg via INTRAVENOUS
  Filled 2022-11-23: qty 4

## 2022-11-23 MED ORDER — CEFTRIAXONE SODIUM 2 G IJ SOLR
2.0000 g | INTRAMUSCULAR | Status: DC
  Administered 2022-11-23 – 2022-11-29 (×7): 2 g via INTRAVENOUS
  Filled 2022-11-23 (×7): qty 20

## 2022-11-23 MED ORDER — PHENOL 1.4 % MT LIQD
1.0000 | OROMUCOSAL | Status: DC | PRN
  Administered 2022-11-23: 1 via OROMUCOSAL
  Filled 2022-11-23 (×3): qty 177

## 2022-11-23 MED ORDER — ALUM & MAG HYDROXIDE-SIMETH 200-200-20 MG/5ML PO SUSP: 30.0000 mL | ORAL | Status: DC | PRN

## 2022-11-23 MED ORDER — IPRATROPIUM-ALBUTEROL 0.5-2.5 (3) MG/3ML IN SOLN
3.0000 mL | Freq: Four times a day (QID) | RESPIRATORY_TRACT | Status: DC
  Administered 2022-11-23: 3 mL via RESPIRATORY_TRACT
  Filled 2022-11-23: qty 3

## 2022-11-23 MED ORDER — OXYCODONE HCL 5 MG PO TABS
2.5000 mg | ORAL_TABLET | ORAL | Status: DC | PRN
  Administered 2022-11-23 – 2022-11-27 (×8): 2.5 mg via ORAL
  Filled 2022-11-23 (×8): qty 1

## 2022-11-23 MED ORDER — IPRATROPIUM-ALBUTEROL 0.5-2.5 (3) MG/3ML IN SOLN
3.0000 mL | Freq: Two times a day (BID) | RESPIRATORY_TRACT | Status: DC
  Administered 2022-11-23: 3 mL via RESPIRATORY_TRACT
  Filled 2022-11-23: qty 3

## 2022-11-23 MED ORDER — SODIUM CHLORIDE 0.9 % IV SOLN: INTRAVENOUS | Status: AC

## 2022-11-23 MED ORDER — NICOTINE 14 MG/24HR TD PT24: 14.0000 mg | MEDICATED_PATCH | Freq: Every day | TRANSDERMAL | Status: DC | PRN

## 2022-11-23 MED ORDER — SODIUM CHLORIDE (PF) 0.9 % IJ SOLN
INTRAMUSCULAR | Status: AC
  Filled 2022-11-23: qty 50

## 2022-11-23 MED ORDER — LIDOCAINE VISCOUS HCL 2 % MT SOLN
15.0000 mL | OROMUCOSAL | Status: DC | PRN
  Filled 2022-11-23: qty 15

## 2022-11-23 MED ORDER — ACETAMINOPHEN 325 MG PO TABS
650.0000 mg | ORAL_TABLET | Freq: Four times a day (QID) | ORAL | Status: DC | PRN
  Administered 2022-11-23 – 2022-11-25 (×7): 650 mg via ORAL
  Filled 2022-11-23 (×9): qty 2

## 2022-11-23 MED ORDER — IOHEXOL 350 MG/ML SOLN
100.0000 mL | Freq: Once | INTRAVENOUS | Status: AC | PRN
  Administered 2022-11-23: 80 mL via INTRAVENOUS

## 2022-11-23 MED ORDER — LOSARTAN POTASSIUM 50 MG PO TABS
50.0000 mg | ORAL_TABLET | Freq: Every day | ORAL | Status: DC
  Administered 2022-11-23 – 2022-11-29 (×7): 50 mg via ORAL
  Filled 2022-11-23 (×7): qty 1

## 2022-11-23 MED ORDER — ACETAMINOPHEN 650 MG RE SUPP: 650.0000 mg | Freq: Four times a day (QID) | RECTAL | Status: DC | PRN

## 2022-11-23 MED ORDER — SODIUM CHLORIDE 0.9 % IV SOLN: 2.0000 g | Freq: Once | INTRAVENOUS | Status: DC

## 2022-11-23 MED ORDER — ENOXAPARIN SODIUM 40 MG/0.4ML IJ SOSY
40.0000 mg | PREFILLED_SYRINGE | INTRAMUSCULAR | Status: DC
  Administered 2022-11-23: 40 mg via SUBCUTANEOUS
  Filled 2022-11-23: qty 0.4

## 2022-11-23 MED ORDER — ASPIRIN 81 MG PO TBEC
81.0000 mg | DELAYED_RELEASE_TABLET | Freq: Every day | ORAL | Status: DC
  Administered 2022-11-23 – 2022-11-24 (×2): 81 mg via ORAL
  Filled 2022-11-23 (×2): qty 1

## 2022-11-23 MED ORDER — KETOROLAC TROMETHAMINE 15 MG/ML IJ SOLN
15.0000 mg | Freq: Once | INTRAMUSCULAR | Status: AC
  Administered 2022-11-23: 15 mg via INTRAVENOUS
  Filled 2022-11-23: qty 1

## 2022-11-23 NOTE — H&P (Signed)
History and Physical    Patient: Frederick Andrews MVH:[id number] DOB: [birth year] DOA: 11/23/2022 DOS: the patient was seen and examined on 11/23/2022 PCP: Patient, No Pcp Per  Patient coming from: Home  Chief Complaint:  Chief Complaint  Patient presents with   positive blood cultures   HPI: Frederick Andrews is a 72 y.o. male with medical history significant of hyperlipidemia, hypertension, tobacco use, chronic lung disease, osteoarthritis of the knee who is returning to the emergency department due to positive blood cultures.  He was seen yesterday due to left shoulder pain after having a fall and landing on it.  No prodromal symptoms prior to fall.  He has also been having right shoulder pain and was seen the urgent care and given muscle relaxants.  Pain radiates to his neck as well.  They have also noticed that his appetite was decreased, he has felt weak, malaised and has been running low-grade temperatures. No abdominal pain, nausea, emesis, diarrhea, constipation, melena or hematochezia.  No flank pain, dysuria, frequency or hematuria. He has been having sore throat, but denied fever, chills, rhinorrhea, cough, wheezing or hemoptysis.  No chest pain, palpitations, diaphoresis, PND, orthopnea or pitting edema of the lower extremities. No polyuria, polydipsia, polyphagia or blurred vision.   Lab work: Urine analysis was Hospital doctor with small hemoglobin, ketones 20 and protein 30 mg/dL.  Urinalysis otherwise unremarkable.  CBC showed a white count of 9.6 with 81% neutrophils, Monnin 13.8 g/dL platelets 841.  CMP showed a CO2 of 17 mmol/L with an anion gap of 16.  The rest of the electrolytes were normal.  Glucose 103, BUN 36, creatinine 1.13 and total bilirubin 1.9 mg/dL.  Total protein 3.3 g/dL and alkaline phosphatase 162 units/L and albumin 3.3 g/dL.  Imaging: CT head without contrast with no acute intracranial normality.  CT cervical spine with multiuse level cervical spondylosis, worse at C5-6 where there is  mild spinal canal stenosis.  There is no acute cervical spine fracture or traumatic malalignment.  ED Course: Initial vital signs were temperature 98.5 F, pulse 75, respirations 16, BP 142/76 mmHg and O2 sat 97% on room air.  Patient was given acetaminophen 1000 mg p.o. x 1 and was started on ceftriaxone 2 g IVPB daily.  I added Toradol 15 mg IVP and NS bolus 1000 mL x 1.   Review of Systems: As mentioned in the history of present illness. All other systems reviewed and are negative. Past Medical History:  Diagnosis Date   Hyperlipemia    Hypertension    History reviewed. No pertinent surgical history. Social History:  reports that he has been smoking cigarettes. He has a 40 pack-year smoking history. He does not have any smokeless tobacco history on file. He reports that he does not drink alcohol and does not use drugs.  No Known Allergies  History reviewed. No pertinent family history.  Prior to Admission medications   Medication Sig Start Date End Date Taking? Authorizing Provider  aspirin EC 81 MG tablet Take 81 mg by mouth daily.      [provider]  HYDROcodone-acetaminophen (NORCO) 5-325 MG per tablet Take 1-2 tablets by mouth every 6 (six) hours as needed for severe pain. 11/19/14   Doug Sou, MD    Physical Exam: Vitals:   11/23/22 0858 11/23/22 0910  BP: (!) 142/76   Pulse: (!) 105   Resp: 16   Temp: 98.5 F (36.9 C)   TempSrc: Oral   SpO2: 97%   Weight:  55 kg  Height:  5\' 3"  (1.6 m)   Physical Exam Vitals and nursing note reviewed.  Constitutional:      General: He is awake. He is not in acute distress.    Appearance: Normal appearance. He is ill-appearing. He is not toxic-appearing.  HENT:     Head: Normocephalic.     Nose: No rhinorrhea.  Eyes:     General: No scleral icterus.    Pupils: Pupils are equal, round, and reactive to light.  Neck:     Vascular: No JVD.  Cardiovascular:     Rate and Rhythm: Normal rate and regular rhythm.      Heart sounds: S1 normal and S2 normal.  Pulmonary:     Effort: Pulmonary effort is normal.     Breath sounds: Wheezing present.  Abdominal:     General: Bowel sounds are normal. There is no distension.     Palpations: Abdomen is soft.     Tenderness: There is no abdominal tenderness.  Musculoskeletal:     Cervical back: Neck supple.     Right lower leg: No edema.     Left lower leg: No edema.  Skin:    General: Skin is warm and dry.  Neurological:     General: No focal deficit present.     Mental Status: He is alert and oriented to person, place, and time.  Psychiatric:        Mood and Affect: Mood normal.        Behavior: Behavior normal. Behavior is cooperative.    Data Reviewed:  Results are pending, will review when available.  Assessment and Plan: Principal Problem:   Bacteremia due to Escherichia coli Admit to telemetry/inpatient. Continue IV fluids. Continue ceftriaxone 2 g IVPB daily. Follow-up blood culture and sensitivity. Follow-up CBC and chemistry in the morning. Given bacteremia/smoking history will obtain CT abdomen  Active Problems:   Coronary artery calcification Check echocardiogram.    Normocytic anemia  Monitor hematocrit and hemoglobin. Check anemia panel.    Hyperlipidemia   Aortic atherosclerosis (HCC) Hold atorvastatin given abnormal LFTs.    Essential hypertension Continue losartan 100 mg p.o. twice daily. Labetalol 10 mg every 2 hours as needed.    Chronic lung disease With acute dyspnea. Will obtain CTA of the chest    Tobacco use Tobacco cessation. Nicotine replacement therapy as needed.     Advance Care Planning:   Code Status: Full Code   Consults:   Family Communication: His son and daughter were at bedside.  Severity of Illness: The appropriate patient status for this patient is INPATIENT. Inpatient status is judged to be reasonable and necessary in order to provide the required intensity of service to ensure the  patient's safety. The patient's presenting symptoms, physical exam findings, and initial radiographic and laboratory data in the context of their chronic comorbidities is felt to place them at high risk for further clinical deterioration. Furthermore, it is not anticipated that the patient will be medically stable for discharge from the hospital within 2 midnights of admission.   * I certify that at the point of admission it is my clinical judgment that the patient will require inpatient hospital care spanning beyond 2 midnights from the point of admission due to high intensity of service, high risk for further deterioration and high frequency of surveillance required.*  Author: Bobette Mo, MD 11/23/2022 10:37 AM  For on call review [email or web address].   This document was prepared using Conservation officer, historic buildings and may contain  some unintended transcription errors.

## 2022-11-23 NOTE — ED Triage Notes (Signed)
Pt arrived POV with son and daughter at bedside who reports they received a call this morning from the hospital to come back for positive blood cultures. Per daughter  states she is concerned as he is not eating and drinking since yesterday. Patent was seen yesterday and evaluated/discharged. Denies any cp,sob,dizziness or any other symptoms. Endorses "throat pain" and dry mouth. Patient airway

## 2022-11-23 NOTE — ED Notes (Signed)
ED TO INPATIENT HANDOFF REPORT  Name/Age/Gender Frederick Andrews [age] y.o. male  Code Status    Code Status Orders  (From admission, onward)           Start     Ordered   11/23/22 1216  Full code  Continuous       Question:  By:  Answer:  Consent: discussion documented in EHR   11/23/22 1216           Code Status History     This patient has a current code status but no historical code status.       Home/SNF/Other Home  Chief Complaint Bacteremia due to Escherichia coli [R78.81, B96.20]  Level of Care/Admitting Diagnosis ED Disposition     ED Disposition  Admit   Condition  --   Comment  Hospital Area: [facility] [[id number]]  Level of Care: Telemetry [5]  Admit to tele based on following criteria: Monitor QTC interval  Admit to tele based on following criteria: Monitor for Ischemic changes  May admit patient to [facility] or [facility] if equivalent level of care is available:: No  Covid Evaluation: Asymptomatic - no recent exposure (last 10 days) testing not required  Diagnosis: Bacteremia due to Escherichia coli [960454]  Admitting Physician: Bobette Mo [[id number]]  Attending Physician: Bobette Mo [[id number]]  Certification:: I certify this patient will need inpatient services for at least 2 midnights  Estimated Length of Stay: 2          Medical History Past Medical History:  Diagnosis Date   Hyperlipemia    Hypertension     Allergies No Known Allergies  IV Location/Drains/Wounds Patient Lines/Drains/Airways Status     Active Line/Drains/Airways     Name Placement date Placement time Site Days   Peripheral IV 11/23/22 22 G 1" Posterior;Right Hand 11/23/22  1042  Hand  less than 1            Labs/Imaging Results for orders placed or performed during the hospital encounter of 11/22/22 (from the past 48 hour(s))  Culture, blood (routine x 2)     Status: None (Preliminary result)   Collection Time:  11/22/22  1:08 PM   Specimen: BLOOD RIGHT FOREARM  Result Value Ref Range   Specimen Description      BLOOD RIGHT FOREARM Performed at [facility], [address] [place], [place] [address]    Special Requests      BOTTLES DRAWN AEROBIC AND ANAEROBIC Blood Culture adequate volume Performed at [facility], [address] [place], [place] [address]    Culture  Setup Time      GRAM NEGATIVE RODS AEROBIC BOTTLE ONLY CRITICAL RESULT CALLED TO, READ BACK BY AND VERIFIED WITH: RN Rush Farmer ON [id number] @0723  BY SM Performed at [facility], [address], [place], [place] [address]    Culture GRAM NEGATIVE RODS    Report Status PENDING   Blood Culture ID Panel (Reflexed)     Status: Abnormal   Collection Time: 11/22/22  1:08 PM  Result Value Ref Range   Enterococcus faecalis NOT DETECTED NOT DETECTED   Enterococcus Faecium NOT DETECTED NOT DETECTED   Listeria monocytogenes NOT DETECTED NOT DETECTED   Staphylococcus species NOT DETECTED NOT DETECTED   Staphylococcus aureus (BCID) NOT DETECTED NOT DETECTED   Staphylococcus epidermidis NOT DETECTED NOT DETECTED   Staphylococcus lugdunensis NOT DETECTED NOT DETECTED   Streptococcus species NOT DETECTED NOT DETECTED  Streptococcus agalactiae NOT DETECTED NOT DETECTED   Streptococcus pneumoniae NOT DETECTED NOT DETECTED   Streptococcus pyogenes NOT DETECTED NOT DETECTED   A.calcoaceticus-baumannii NOT DETECTED NOT DETECTED   Bacteroides fragilis NOT DETECTED NOT DETECTED   Enterobacterales DETECTED (A) NOT DETECTED    Comment: Enterobacterales represent a large order of gram negative bacteria, not a single organism. CRITICAL RESULT CALLED TO, READ BACK BY AND VERIFIED WITH: RN Rush Farmer ON [id number] @0723  BY SM    Enterobacter cloacae complex NOT DETECTED NOT DETECTED   Escherichia coli DETECTED (A) NOT DETECTED    Comment: CRITICAL RESULT CALLED TO, READ BACK BY AND VERIFIED WITH: RN Rush Farmer ON [phone]  @0723  BY SM    Klebsiella aerogenes NOT DETECTED NOT DETECTED   Klebsiella oxytoca NOT DETECTED NOT DETECTED   Klebsiella pneumoniae NOT DETECTED NOT DETECTED   Proteus species NOT DETECTED NOT DETECTED   Salmonella species NOT DETECTED NOT DETECTED   Serratia marcescens NOT DETECTED NOT DETECTED   Haemophilus influenzae NOT DETECTED NOT DETECTED   Neisseria meningitidis NOT DETECTED NOT DETECTED   Pseudomonas aeruginosa NOT DETECTED NOT DETECTED   Stenotrophomonas maltophilia NOT DETECTED NOT DETECTED   Candida albicans NOT DETECTED NOT DETECTED   Candida auris NOT DETECTED NOT DETECTED   Candida glabrata NOT DETECTED NOT DETECTED   Candida krusei NOT DETECTED NOT DETECTED   Candida parapsilosis NOT DETECTED NOT DETECTED   Candida tropicalis NOT DETECTED NOT DETECTED   Cryptococcus neoformans/gattii NOT DETECTED NOT DETECTED   CTX-M ESBL NOT DETECTED NOT DETECTED   Carbapenem resistance IMP NOT DETECTED NOT DETECTED   Carbapenem resistance KPC NOT DETECTED NOT DETECTED   Carbapenem resistance NDM NOT DETECTED NOT DETECTED   Carbapenem resist OXA 48 LIKE NOT DETECTED NOT DETECTED   Carbapenem resistance VIM NOT DETECTED NOT DETECTED    Comment: Performed at [facility], [address], [place], [place] [address]  Comprehensive metabolic panel     Status: Abnormal   Collection Time: 11/22/22  1:12 PM  Result Value Ref Range   Sodium 135 135 - 145 mmol/L   Potassium 4.1 3.5 - 5.1 mmol/L   Chloride 102 98 - 111 mmol/L   CO2 17 (L) 22 - 32 mmol/L   Glucose, Bld 103 (H) 70 - 99 mg/dL    Comment: Glucose reference range applies only to samples taken after fasting for at least 8 hours.   BUN 36 (H) 8 - 23 mg/dL   Creatinine, Ser 9.81 0.61 - 1.24 mg/dL   Calcium 9.0 8.9 - 19.1 mg/dL   Total Protein 8.1 6.5 - 8.1 g/dL   Albumin 3.3 (L) 3.5 - 5.0 g/dL   AST 28 15 - 41 U/L   ALT 32 0 - 44 U/L   Alkaline Phosphatase 162 (H) 38 - 126 U/L   Total Bilirubin 1.8 (H) 0.3 - 1.2 mg/dL    GFR, Estimated >47 >82 mL/min    Comment: (NOTE) Calculated using the CKD-EPI Creatinine Equation (2021)    Anion gap 16 (H) 5 - 15    Comment: Performed at [facility], [address], [place], [place] [address]  CBC with Differential     Status: Abnormal   Collection Time: 11/22/22  1:12 PM  Result Value Ref Range   WBC 9.6 4.0 - 10.5 K/uL   RBC 4.82 4.22 - 5.81 MIL/uL   Hemoglobin 13.8 13.0 - 17.0 g/dL   HCT 30.8 65.7 - 84.6 %   MCV 86.7  80.0 - 100.0 fL   MCH 28.6 26.0 - 34.0 pg   MCHC 33.0 30.0 - 36.0 g/dL   RDW 33.2 95.1 - 88.4 %   Platelets 122 (L) 150 - 400 K/uL   nRBC 0.2 0.0 - 0.2 %   Neutrophils Relative % 81 %   Neutro Abs 7.9 (H) 1.7 - 7.7 K/uL   Lymphocytes Relative 9 %   Lymphs Abs 0.8 0.7 - 4.0 K/uL   Monocytes Relative 7 %   Monocytes Absolute 0.7 0.1 - 1.0 K/uL   Eosinophils Relative 0 %   Eosinophils Absolute 0.0 0.0 - 0.5 K/uL   Basophils Relative 1 %   Basophils Absolute 0.1 0.0 - 0.1 K/uL   Immature Granulocytes 2 %   Abs Immature Granulocytes 0.20 (H) 0.00 - 0.07 K/uL    Comment: Performed at [facility] [address] [place], [place] [address]  Urinalysis, w/ Reflex to Culture (Infection Suspected) -Urine, Clean Catch     Status: Abnormal   Collection Time: 11/22/22  1:26 PM  Result Value Ref Range   Specimen Source URINE, CLEAN CATCH    Color, Urine AMBER (A) YELLOW    Comment: BIOCHEMICALS MAY BE AFFECTED BY COLOR   APPearance HAZY (A) CLEAR   Specific Gravity, Urine 1.020 1.005 - 1.030   pH 5.0 5.0 - 8.0   Glucose, UA NEGATIVE NEGATIVE mg/dL   Hgb urine dipstick SMALL (A) NEGATIVE   Bilirubin Urine NEGATIVE NEGATIVE   Ketones, ur 20 (A) NEGATIVE mg/dL   Protein, ur 30 (A) NEGATIVE mg/dL   Nitrite NEGATIVE NEGATIVE   Leukocytes,Ua NEGATIVE NEGATIVE   RBC / HPF 11-20 0 - 5 RBC/hpf   WBC, UA 11-20 0 - 5 WBC/hpf    Comment:        Reflex urine culture not performed if WBC <=10, OR if Squamous  epithelial cells >5. If Squamous epithelial cells >5 suggest recollection.    Bacteria, UA NONE SEEN NONE SEEN   Squamous Epithelial / HPF 0-5 0 - 5 /HPF   Mucus PRESENT    Hyaline Casts, UA PRESENT     Comment: Performed at [facility], [address], [place], [place] [address]  I-Stat Lactic Acid     Status: None   Collection Time: 11/22/22  1:35 PM  Result Value Ref Range   Lactic Acid, Venous 1.8 0.5 - 1.9 mmol/L  SARS Coronavirus 2 by RT PCR (hospital order, performed in [facility] hospital lab) *cepheid single result test* Anterior Nasal Swab     Status: None   Collection Time: 11/22/22  1:40 PM   Specimen: Anterior Nasal Swab  Result Value Ref Range   SARS Coronavirus 2 by RT PCR NEGATIVE NEGATIVE    Comment: (NOTE) SARS-CoV-2 target nucleic acids are NOT DETECTED.  The SARS-CoV-2 RNA is generally detectable in upper and lower respiratory specimens during the acute phase of infection. The lowest concentration of SARS-CoV-2 viral copies this assay can detect is 250 copies / mL. A negative result does not preclude SARS-CoV-2 infection and should not be used as the sole basis for treatment or other patient management decisions.  A negative result may occur with improper specimen collection / handling, submission of specimen other than nasopharyngeal swab, presence of viral mutation(s) within the areas targeted by this assay, and inadequate number of viral copies (<250 copies / mL). A negative result must be combined with clinical observations, patient history, and epidemiological information.  Fact Sheet for  Patients:   RoadLapTop.co.za  Fact Sheet for Healthcare Providers: http://kim-miller.com/  This test is not yet approved or  cleared by the [place] FDA and has been authorized for detection and/or diagnosis of SARS-CoV-2 by FDA under an Emergency Use Authorization (EUA).  This EUA will remain in  effect (meaning this test can be used) for the duration of the COVID-19 declaration under Section 564(b)(1) of the Act, 21 U.S.C. section 360bbb-3(b)(1), unless the authorization is terminated or revoked sooner.  Performed at [facility], [address] [place], [place] [address]   I-Stat Lactic Acid     Status: None   Collection Time: 11/22/22  3:04 PM  Result Value Ref Range   Lactic Acid, Venous 0.9 0.5 - 1.9 mmol/L   CT Head Wo Contrast  Result Date: 11/22/2022 CLINICAL DATA:  falls; Neck pain, chronic falls, neck pain. EXAM: CT HEAD WITHOUT CONTRAST CT CERVICAL SPINE WITHOUT CONTRAST TECHNIQUE: Multidetector CT imaging of the head and cervical spine was performed following the standard protocol without intravenous contrast. Multiplanar CT image reconstructions of the cervical spine were also generated. RADIATION DOSE REDUCTION: This exam was performed according to the departmental dose-optimization program which includes automated exposure control, adjustment of the mA and/or kV according to patient size and/or use of iterative reconstruction technique. COMPARISON:  None Available. FINDINGS: CT HEAD FINDINGS Brain: No acute intracranial hemorrhage. Gray-white differentiation is preserved. No hydrocephalus or extra-axial collection. No mass effect or midline shift. Vascular: No hyperdense vessel or unexpected calcification. Skull: No calvarial fracture or suspicious bone lesion. Skull base is unremarkable. Sinuses/Orbits: No acute finding. Other: None. CT CERVICAL SPINE FINDINGS Alignment: 3 mm degenerative anterolisthesis of C4 on C5 with focal kyphosis at this level. No traumatic malalignment. Skull base and vertebrae: No acute fracture. Normal craniocervical junction. No suspicious bone lesions. Soft tissues and spinal canal: No prevertebral fluid or swelling. No visible canal hematoma. Disc levels: Multilevel cervical spondylosis, worst at C5-6, where there is mild spinal  canal stenosis. Upper chest: Emphysema and scarring in the lung apices. Other: Atherosclerotic calcifications of the carotid bulbs. IMPRESSION: 1. No acute intracranial abnormality. 2. No acute cervical spine fracture or traumatic malalignment. 3. Multilevel cervical spondylosis, worst at C5-6, where there is mild spinal canal stenosis. Electronically Signed   By: Orvan Falconer M.D.   On: 11/22/2022 15:06   CT Cervical Spine Wo Contrast  Result Date: 11/22/2022 CLINICAL DATA:  falls; Neck pain, chronic falls, neck pain. EXAM: CT HEAD WITHOUT CONTRAST CT CERVICAL SPINE WITHOUT CONTRAST TECHNIQUE: Multidetector CT imaging of the head and cervical spine was performed following the standard protocol without intravenous contrast. Multiplanar CT image reconstructions of the cervical spine were also generated. RADIATION DOSE REDUCTION: This exam was performed according to the departmental dose-optimization program which includes automated exposure control, adjustment of the mA and/or kV according to patient size and/or use of iterative reconstruction technique. COMPARISON:  None Available. FINDINGS: CT HEAD FINDINGS Brain: No acute intracranial hemorrhage. Gray-white differentiation is preserved. No hydrocephalus or extra-axial collection. No mass effect or midline shift. Vascular: No hyperdense vessel or unexpected calcification. Skull: No calvarial fracture or suspicious bone lesion. Skull base is unremarkable. Sinuses/Orbits: No acute finding. Other: None. CT CERVICAL SPINE FINDINGS Alignment: 3 mm degenerative anterolisthesis of C4 on C5 with focal kyphosis at this level. No traumatic malalignment. Skull base and vertebrae: No acute fracture. Normal craniocervical junction. No suspicious bone lesions. Soft tissues and spinal canal: No prevertebral fluid or swelling. No visible canal  hematoma. Disc levels: Multilevel cervical spondylosis, worst at C5-6, where there is mild spinal canal stenosis. Upper chest:  Emphysema and scarring in the lung apices. Other: Atherosclerotic calcifications of the carotid bulbs. IMPRESSION: 1. No acute intracranial abnormality. 2. No acute cervical spine fracture or traumatic malalignment. 3. Multilevel cervical spondylosis, worst at C5-6, where there is mild spinal canal stenosis. Electronically Signed   By: Orvan Falconer M.D.   On: 11/22/2022 15:06   DG Chest Port 1 View  Result Date: 11/22/2022 CLINICAL DATA:  Fever. EXAM: PORTABLE CHEST 1 VIEW COMPARISON:  None Available. FINDINGS: Mild cardiomegaly. Minimal bilateral midlung opacities are noted suggesting focal atelectasis or less likely infiltrates. Bony thorax is unremarkable. IMPRESSION: Minimal bilateral midlung opacities are noted suggesting focal atelectasis or less likely infiltrates. Electronically Signed   By: Lupita Raider M.D.   On: 11/22/2022 14:13   DG Shoulder Right  Result Date: 11/22/2022 CLINICAL DATA:  Pain.  Fall. EXAM: RIGHT SHOULDER - 2+ VIEW COMPARISON:  None Available. FINDINGS: Three views of the right shoulder. Mild acromioclavicular joint space narrowing and peripheral osteophytosis. The glenohumeral joint space is maintained. No acute fracture or dislocation. Mild-to-moderate degenerative disc changes are noted within the thoracic spine. IMPRESSION: Mild acromioclavicular osteoarthritis. Electronically Signed   By: Neita Garnet M.D.   On: 11/22/2022 13:43   DG Shoulder Left  Result Date: 11/22/2022 CLINICAL DATA:  Pain.  Fall. EXAM: LEFT SHOULDER - 2+ VIEW COMPARISON:  None Available. FINDINGS: Frontal and transscapular Y-views of the left shoulder. Mild glenohumeral joint space narrowing. Minimal inferior glenoid degenerative osteophytosis. Mild acromioclavicular subchondral cystic change and peripheral osteophytosis. No acute fracture is seen. No dislocation. Mild-to-moderate atherosclerotic calcifications within the aortic arch. IMPRESSION: Mild glenohumeral and acromioclavicular  osteoarthritis. Electronically Signed   By: Neita Garnet M.D.   On: 11/22/2022 13:42    Pending Labs Unresulted Labs (From admission, onward)     Start     Ordered   11/24/22 0500  CBC  Tomorrow morning,   R        11/23/22 1216   11/24/22 0500  Comprehensive metabolic panel  Tomorrow morning,   R        11/23/22 1216   11/23/22 1217  Hepatic function panel  Once,   R        11/23/22 1216   11/23/22 1217  Basic metabolic panel  ONCE - STAT,   STAT        11/23/22 1216   11/23/22 1217  APTT  Once,   R        11/23/22 1216   11/23/22 1216  Protime-INR  Once,   R        11/23/22 1216   11/23/22 1216  CBC with Differential/Platelet  Once,   R        11/23/22 1216   11/23/22 1216  Magnesium  Once,   R        11/23/22 1216   11/23/22 1216  Phosphorus  Once,   R        11/23/22 1216            Vitals/Pain Today's Vitals   11/23/22 1130 11/23/22 1145 11/23/22 1304 11/23/22 1325  BP: (!) 157/79     Pulse: 73 76    Resp: (!) 28 (!) 23    Temp:   97.7 F (36.5 C)   TempSrc:   Oral   SpO2: 98% 95%    Weight:  Height:      PainSc:    10-Worst pain ever    Isolation Precautions No active isolations  Medications Medications  cefTRIAXone (ROCEPHIN) 2 g in sodium chloride 0.9 % 100 mL IVPB (0 g Intravenous Stopped 11/23/22 1112)  enoxaparin (LOVENOX) injection 40 mg (has no administration in time range)  acetaminophen (TYLENOL) tablet 650 mg (has no administration in time range)    Or  acetaminophen (TYLENOL) suppository 650 mg (has no administration in time range)  0.9 %  sodium chloride infusion ( Intravenous New Bag/Given 11/23/22 1338)  ondansetron (ZOFRAN) tablet 4 mg (has no administration in time range)    Or  ondansetron (ZOFRAN) injection 4 mg (has no administration in time range)  oxyCODONE (Oxy IR/ROXICODONE) immediate release tablet 2.5 mg (has no administration in time range)  ipratropium-albuterol (DUONEB) 0.5-2.5 (3) MG/3ML nebulizer solution 3 mL (has no  administration in time range)  acetaminophen (TYLENOL) tablet 1,000 mg (1,000 mg Oral Given 11/23/22 1043)  sodium chloride 0.9 % bolus 1,000 mL (1,000 mLs Intravenous New Bag/Given 11/23/22 1338)  ketorolac (TORADOL) 15 MG/ML injection 15 mg (15 mg Intravenous Given 11/23/22 1337)    Mobility walks with person assist

## 2022-11-23 NOTE — ED Provider Notes (Signed)
[place] EMERGENCY DEPARTMENT AT [facility] Provider Note   CSN: [id number] Arrival date & time: 11/23/22  4098     History  Chief Complaint  Patient presents with   positive blood cultures    Frederick Andrews is a 72 y.o. male history of hypertension, hyperlipidemia presented for positive blood culture.  Patient was seen yesterday by this provider for musculoskeletal neck pain with reassuring workup but did report feeling warm at home and according to son daughter acting altered and so blood culture was ordered.  Since discharge patient states that his neck pain is gone better after taking Tylenol at home but received a call this morning they need to come to the ER for positive blood culture.  Son and daughter are present to assist in history.  Patient, family members deny chest pain, shortness of breath, nausea/vomiting, rashes  Home Medications Prior to Admission medications   Medication Sig Start Date End Date Taking? Authorizing Provider  aspirin EC 81 MG tablet Take 81 mg by mouth daily.      [provider]  HYDROcodone-acetaminophen (NORCO) 5-325 MG per tablet Take 1-2 tablets by mouth every 6 (six) hours as needed for severe pain. 11/19/14   Doug Sou, MD      Allergies    Patient has no known allergies.    Review of Systems   Review of Systems See HPI Physical Exam Updated Vital Signs BP (!) 158/88 (BP Location: Right Arm)   Pulse 87   Temp 98.5 F (36.9 C) (Oral)   Resp 16   Ht 5\' 3"  (1.6 m)   Wt 55 kg   SpO2 96%   BMI 21.48 kg/m  Physical Exam Vitals reviewed.  Constitutional:      General: He is not in acute distress. HENT:     Head: Normocephalic and atraumatic.     Mouth/Throat:     Mouth: Mucous membranes are dry.  Eyes:     Extraocular Movements: Extraocular movements intact.     Conjunctiva/sclera: Conjunctivae normal.     Pupils: Pupils are equal, round, and reactive to light.  Cardiovascular:     Rate and Rhythm:  Regular rhythm. Tachycardia present.     Pulses: Normal pulses.     Heart sounds: Normal heart sounds.     Comments: 2+ bilateral radial/dorsalis pedis pulses with increased rate Pulmonary:     Effort: Pulmonary effort is normal. No respiratory distress.     Breath sounds: Normal breath sounds.  Abdominal:     Palpations: Abdomen is soft.     Tenderness: There is no abdominal tenderness. There is no guarding or rebound.  Musculoskeletal:        General: Normal range of motion.     Cervical back: Normal range of motion and neck supple.     Comments: 5 out of 5 bilateral grip/leg extension strength  Skin:    General: Skin is warm and dry.     Capillary Refill: Capillary refill takes less than 2 seconds.  Neurological:     General: No focal deficit present.     Mental Status: He is alert and oriented to person, place, and time.     Comments: Sensation intact in all 4 limbs  Psychiatric:        Mood and Affect: Mood normal.     ED Results / Procedures / Treatments   Labs (all labs ordered are listed, but only abnormal results are displayed) Labs Reviewed - No data to display  EKG None  Radiology CT Head Wo Contrast  Result Date: 11/22/2022 CLINICAL DATA:  falls; Neck pain, chronic falls, neck pain. EXAM: CT HEAD WITHOUT CONTRAST CT CERVICAL SPINE WITHOUT CONTRAST TECHNIQUE: Multidetector CT imaging of the head and cervical spine was performed following the standard protocol without intravenous contrast. Multiplanar CT image reconstructions of the cervical spine were also generated. RADIATION DOSE REDUCTION: This exam was performed according to the departmental dose-optimization program which includes automated exposure control, adjustment of the mA and/or kV according to patient size and/or use of iterative reconstruction technique. COMPARISON:  None Available. FINDINGS: CT HEAD FINDINGS Brain: No acute intracranial hemorrhage. Gray-white differentiation is preserved. No  hydrocephalus or extra-axial collection. No mass effect or midline shift. Vascular: No hyperdense vessel or unexpected calcification. Skull: No calvarial fracture or suspicious bone lesion. Skull base is unremarkable. Sinuses/Orbits: No acute finding. Other: None. CT CERVICAL SPINE FINDINGS Alignment: 3 mm degenerative anterolisthesis of C4 on C5 with focal kyphosis at this level. No traumatic malalignment. Skull base and vertebrae: No acute fracture. Normal craniocervical junction. No suspicious bone lesions. Soft tissues and spinal canal: No prevertebral fluid or swelling. No visible canal hematoma. Disc levels: Multilevel cervical spondylosis, worst at C5-6, where there is mild spinal canal stenosis. Upper chest: Emphysema and scarring in the lung apices. Other: Atherosclerotic calcifications of the carotid bulbs. IMPRESSION: 1. No acute intracranial abnormality. 2. No acute cervical spine fracture or traumatic malalignment. 3. Multilevel cervical spondylosis, worst at C5-6, where there is mild spinal canal stenosis. Electronically Signed   By: Orvan Falconer M.D.   On: 11/22/2022 15:06   CT Cervical Spine Wo Contrast  Result Date: 11/22/2022 CLINICAL DATA:  falls; Neck pain, chronic falls, neck pain. EXAM: CT HEAD WITHOUT CONTRAST CT CERVICAL SPINE WITHOUT CONTRAST TECHNIQUE: Multidetector CT imaging of the head and cervical spine was performed following the standard protocol without intravenous contrast. Multiplanar CT image reconstructions of the cervical spine were also generated. RADIATION DOSE REDUCTION: This exam was performed according to the departmental dose-optimization program which includes automated exposure control, adjustment of the mA and/or kV according to patient size and/or use of iterative reconstruction technique. COMPARISON:  None Available. FINDINGS: CT HEAD FINDINGS Brain: No acute intracranial hemorrhage. Gray-white differentiation is preserved. No hydrocephalus or extra-axial  collection. No mass effect or midline shift. Vascular: No hyperdense vessel or unexpected calcification. Skull: No calvarial fracture or suspicious bone lesion. Skull base is unremarkable. Sinuses/Orbits: No acute finding. Other: None. CT CERVICAL SPINE FINDINGS Alignment: 3 mm degenerative anterolisthesis of C4 on C5 with focal kyphosis at this level. No traumatic malalignment. Skull base and vertebrae: No acute fracture. Normal craniocervical junction. No suspicious bone lesions. Soft tissues and spinal canal: No prevertebral fluid or swelling. No visible canal hematoma. Disc levels: Multilevel cervical spondylosis, worst at C5-6, where there is mild spinal canal stenosis. Upper chest: Emphysema and scarring in the lung apices. Other: Atherosclerotic calcifications of the carotid bulbs. IMPRESSION: 1. No acute intracranial abnormality. 2. No acute cervical spine fracture or traumatic malalignment. 3. Multilevel cervical spondylosis, worst at C5-6, where there is mild spinal canal stenosis. Electronically Signed   By: Orvan Falconer M.D.   On: 11/22/2022 15:06   DG Chest Port 1 View  Result Date: 11/22/2022 CLINICAL DATA:  Fever. EXAM: PORTABLE CHEST 1 VIEW COMPARISON:  None Available. FINDINGS: Mild cardiomegaly. Minimal bilateral midlung opacities are noted suggesting focal atelectasis or less likely infiltrates. Bony thorax is unremarkable. IMPRESSION: Minimal bilateral midlung opacities are noted suggesting focal  atelectasis or less likely infiltrates. Electronically Signed   By: Lupita Raider M.D.   On: 11/22/2022 14:13   DG Shoulder Right  Result Date: 11/22/2022 CLINICAL DATA:  Pain.  Fall. EXAM: RIGHT SHOULDER - 2+ VIEW COMPARISON:  None Available. FINDINGS: Three views of the right shoulder. Mild acromioclavicular joint space narrowing and peripheral osteophytosis. The glenohumeral joint space is maintained. No acute fracture or dislocation. Mild-to-moderate degenerative disc changes are noted  within the thoracic spine. IMPRESSION: Mild acromioclavicular osteoarthritis. Electronically Signed   By: Neita Garnet M.D.   On: 11/22/2022 13:43   DG Shoulder Left  Result Date: 11/22/2022 CLINICAL DATA:  Pain.  Fall. EXAM: LEFT SHOULDER - 2+ VIEW COMPARISON:  None Available. FINDINGS: Frontal and transscapular Y-views of the left shoulder. Mild glenohumeral joint space narrowing. Minimal inferior glenoid degenerative osteophytosis. Mild acromioclavicular subchondral cystic change and peripheral osteophytosis. No acute fracture is seen. No dislocation. Mild-to-moderate atherosclerotic calcifications within the aortic arch. IMPRESSION: Mild glenohumeral and acromioclavicular osteoarthritis. Electronically Signed   By: Neita Garnet M.D.   On: 11/22/2022 13:42    Procedures Procedures    Medications Ordered in ED Medications  cefTRIAXone (ROCEPHIN) 2 g in sodium chloride 0.9 % 100 mL IVPB (2 g Intravenous New Bag/Given 11/23/22 1042)  acetaminophen (TYLENOL) tablet 1,000 mg (1,000 mg Oral Given 11/23/22 1043)    ED Course/ Medical Decision Making/ A&P                             Medical Decision Making Risk OTC drugs. Decision regarding hospitalization.   Damonte [age] y.o. presented today for positive blood cultures. Working DDx that I considered at this time includes, but not limited to, bacteremia, sepsis.  R/o DDx: Sepsis: These are considered less likely due to history of present illness and physical exam findings  Review of prior external notes: 11/22/2022 ED provider  Unique Tests and My Interpretation:  Blood cultures: E. coli positive  Discussion with Independent Historian:  Son and daughter  Discussion of Management of Tests:  Robb Matar, MD hospitalist  Risk: High: hospitalization or escalation of hospital-level care  Risk Stratification Score: None  Plan: On exam patient was in no acute distress but was tachycardic 105.  Patient's exam shows dry mouth but family  states he has not been drinking water recently explain his symptoms.  Patient's blood cultures do show E. coli and after speaking with the pharmacist 2 g Rocephin was ordered via IV.  Anticipate admission for bacteremia and so hospitalist was consulted.  Patient stable for admission.  Spoke to the hospitalist and patient was accepted for admission.  Patient stable for admission at this time.         Final Clinical Impression(s) / ED Diagnoses Final diagnoses:  Bacteremia    Rx / DC Orders ED Discharge Orders     None         Remi Deter 11/23/22 1058    Bethann Berkshire, MD 11/25/22 1201

## 2022-11-23 NOTE — ED Notes (Addendum)
Lab called and said patient has a positive blood culture. Gram negative rod. E coli. 1 bottle positive. Dr. Estell Harpin in ED notified. Recommended to call patient back and tell them to come back to ED for evaluation and further testing. Patient called. No answer. Voicemail left. Got in contact with patients son, instructed him to please take patient to ED per Dr's orders. Son stated he understands and will take patient to ED.

## 2022-11-24 ENCOUNTER — Inpatient Hospital Stay (HOSPITAL_COMMUNITY): Payer: 59

## 2022-11-24 DIAGNOSIS — B9620 Unspecified Escherichia coli [E. coli] as the cause of diseases classified elsewhere: Secondary | ICD-10-CM | POA: Diagnosis not present

## 2022-11-24 DIAGNOSIS — R7881 Bacteremia: Secondary | ICD-10-CM

## 2022-11-24 LAB — COMPREHENSIVE METABOLIC PANEL WITH GFR
ALT: 28 U/L (ref 0–44)
AST: 27 U/L (ref 15–41)
Albumin: 2.4 g/dL — ABNORMAL LOW (ref 3.5–5.0)
Alkaline Phosphatase: 184 U/L — ABNORMAL HIGH (ref 38–126)
Anion gap: 14 (ref 5–15)
BUN: 31 mg/dL — ABNORMAL HIGH (ref 8–23)
CO2: 14 mmol/L — ABNORMAL LOW (ref 22–32)
Calcium: 7.8 mg/dL — ABNORMAL LOW (ref 8.9–10.3)
Chloride: 109 mmol/L (ref 98–111)
Creatinine, Ser: 0.95 mg/dL (ref 0.61–1.24)
GFR, Estimated: >60 mL/min (ref >60)
Glucose, Bld: 69 mg/dL — ABNORMAL LOW (ref 70–99)
Potassium: 3.8 mmol/L (ref 3.5–5.1)
Sodium: 137 mmol/L (ref 135–145)
Total Bilirubin: 1.6 mg/dL — ABNORMAL HIGH (ref 0.3–1.2)
Total Protein: 6 g/dL — ABNORMAL LOW (ref 6.5–8.1)

## 2022-11-24 LAB — IRON AND TIBC
Iron: 18 ug/dL — ABNORMAL LOW (ref 45–182)
Saturation Ratios: 8 % — ABNORMAL LOW (ref 17.9–39.5)
TIBC: 227 ug/dL — ABNORMAL LOW (ref 250–450)
UIBC: 209 ug/dL

## 2022-11-24 LAB — CBC
HCT: 34.8 % — ABNORMAL LOW (ref 39.0–52.0)
Hemoglobin: 11.4 g/dL — ABNORMAL LOW (ref 13.0–17.0)
MCH: 28.7 pg (ref 26.0–34.0)
MCHC: 32.8 g/dL (ref 30.0–36.0)
MCV: 87.7 fL (ref 80.0–100.0)
Platelets: 122 10*3/uL — ABNORMAL LOW (ref 150–400)
RBC: 3.97 MIL/uL — ABNORMAL LOW (ref 4.22–5.81)
RDW: 14.2 % (ref 11.5–15.5)
WBC: 9.4 10*3/uL (ref 4.0–10.5)
nRBC: 0 % (ref 0.0–0.2)

## 2022-11-24 LAB — FOLATE: Folate: 6.8 ng/mL (ref >5.9)

## 2022-11-24 LAB — RETICULOCYTES
Immature Retic Fract: 9.2 % (ref 2.3–15.9)
RBC.: 3.97 MIL/uL — ABNORMAL LOW (ref 4.22–5.81)
Retic Count, Absolute: 16.8 10*3/uL — ABNORMAL LOW (ref 19.0–186.0)
Retic Ct Pct: 0.4 % (ref 0.4–3.1)

## 2022-11-24 LAB — VITAMIN B12: Vitamin B-12: 1366 pg/mL — ABNORMAL HIGH (ref 180–914)

## 2022-11-24 LAB — FERRITIN: Ferritin: 407 ng/mL — ABNORMAL HIGH (ref 24–336)

## 2022-11-24 MED ORDER — LIDOCAINE 5 % EX PTCH
1.0000 | MEDICATED_PATCH | CUTANEOUS | Status: DC
  Administered 2022-11-24 – 2022-11-29 (×6): 1 via TRANSDERMAL
  Filled 2022-11-24 (×6): qty 1

## 2022-11-24 MED ORDER — VANCOMYCIN HCL 1250 MG/250ML IV SOLN
1250.0000 mg | Freq: Once | INTRAVENOUS | Status: AC
  Administered 2022-11-24: 1250 mg via INTRAVENOUS
  Filled 2022-11-24: qty 250

## 2022-11-24 MED ORDER — SODIUM BICARBONATE 8.4 % IV SOLN
INTRAVENOUS | Status: DC
  Filled 2022-11-24 (×2): qty 150

## 2022-11-24 MED ORDER — GADOBUTROL 1 MMOL/ML IV SOLN
5.0000 mL | Freq: Once | INTRAVENOUS | Status: AC | PRN
  Administered 2022-11-24: 5 mL via INTRAVENOUS

## 2022-11-24 MED ORDER — IPRATROPIUM-ALBUTEROL 0.5-2.5 (3) MG/3ML IN SOLN: 3.0000 mL | Freq: Four times a day (QID) | RESPIRATORY_TRACT | Status: DC | PRN

## 2022-11-24 MED ORDER — VANCOMYCIN HCL IN DEXTROSE 1-5 GM/200ML-% IV SOLN: 1000.0000 mg | INTRAVENOUS | Status: DC

## 2022-11-24 MED ORDER — METHOCARBAMOL 500 MG PO TABS
500.0000 mg | ORAL_TABLET | Freq: Four times a day (QID) | ORAL | Status: DC | PRN
  Administered 2022-11-24 – 2022-11-29 (×11): 500 mg via ORAL
  Filled 2022-11-24 (×11): qty 1

## 2022-11-24 MED ORDER — HYDRALAZINE HCL 25 MG PO TABS
25.0000 mg | ORAL_TABLET | Freq: Four times a day (QID) | ORAL | Status: DC | PRN
  Filled 2022-11-24: qty 1

## 2022-11-24 MED ORDER — KETOROLAC TROMETHAMINE 15 MG/ML IJ SOLN
15.0000 mg | Freq: Four times a day (QID) | INTRAMUSCULAR | Status: AC | PRN
  Administered 2022-11-24 – 2022-11-29 (×13): 15 mg via INTRAVENOUS
  Filled 2022-11-24 (×13): qty 1

## 2022-11-24 MED ORDER — SODIUM CHLORIDE 0.9 % IV SOLN: INTRAVENOUS | Status: DC

## 2022-11-24 NOTE — Consult Note (Signed)
History: 72 y.o. male with medical history significant of hyperlipidemia, hypertension, tobacco use presented with positive blood cultures.  Patient had presented to the ED 1 day prior to presentation after left shoulder pain after having a fall.  Blood cultures were obtained because of complaints of low-grade temperatures.  Subsequently, blood cultures turned positive for gram-negative rods and he was recalled to the ED.  On presentation, WBC of 9.6.  CTA chest showed no PE.  CT abdomen and pelvis with contrast showed mild urinary bladder distention.  On 11/22/2022, patient had CT of the head without contrast which showed no acute intracranial abnormity.  CT cervical spine showed no acute cervical spine fracture or traumatic malalignment but showed multilevel cervical spondylosis, worse at C5-C6 with mild spinal canal stenosis.  He was started on IV antibiotics and fluids.  MRI scans of the left and right shoulders demonstrated significant rotator cuff tears with glenohumeral arthritis.  There were small effusions and given the history of anemia question of septic joint was raised.  As a result Ortho consultation was requested.   Patient's son is available at bedside for translation.  Review of systems: Chronic bilateral shoulder pain significant increase in pain status post recent fall.  He denies any loss of consciousness, nausea, vomiting, loss of appetite.  Past Medical History:  Diagnosis Date   Hyperlipemia    Hypertension     No Known Allergies  No current facility-administered medications on file prior to encounter.   Current Outpatient Medications on File Prior to Encounter  Medication Sig Dispense Refill   albuterol (VENTOLIN HFA) 108 (90 Base) MCG/ACT inhaler Inhale 2 puffs into the lungs every 4 (four) hours as needed for wheezing.     aspirin EC 81 MG tablet Take 81 mg by mouth daily.       atorvastatin (LIPITOR) 20 MG tablet Take 20 mg by mouth daily.     cyclobenzaprine  (FEXMID) 7.5 MG tablet Take 7.5 mg by mouth 3 (three) times daily as needed for muscle spasms.     losartan (COZAAR) 50 MG tablet Take 50 mg by mouth daily.     HYDROcodone-acetaminophen (NORCO) 5-325 MG per tablet Take 1-2 tablets by mouth every 6 (six) hours as needed for severe pain. (Patient not taking: Reported on 11/23/2022) 10 tablet 0    Physical Exam: Vitals:   11/24/22 0424 11/24/22 1357  BP: (!) 186/79 (!) 177/76  Pulse: 90 78  Resp: 20 17  Temp: 98.6 F (37 C) 98.2 F (36.8 C)  SpO2: 98% 95%   Body mass index is 21.48 kg/m. He is alert and oriented x 3. No shortness of breath or chest pain at present. Abdomen is soft and nontender.  No incontinence of bowel or bladder, no rebound tenderness. Intact peripheral pulses bilaterally.  Compartments are soft and nontender. Neuro exam: 5/5 motor strength in the upper and lower extremity bilaterally.  Intact sensation light touch throughout the upper and lower extremity.  Negative Babinski test, Hoffman test, and no clonus. Musculoskeletal exam: Mild pain with both active and passive range of motion of the shoulders.  No effusion or subluxation/instability on exam.  Significant posterior axial neck pain with palpation and range of motion.  Patient states the neck pain is more severe than his chronic shoulder pain.  He is able to push himself in in bed using his wrist.  No elbow or wrist pain.  No ecchymosis or bruising noted over the shoulders.   Image: MR CERVICAL  SPINE W WO CONTRAST  Result Date: 11/24/2022 CLINICAL DATA:  Cervical radiculopathy. Infection suspected. Severe neck and shoulder pain. Bacteremia. EXAM: MRI CERVICAL SPINE WITHOUT AND WITH CONTRAST TECHNIQUE: Multiplanar and multiecho pulse sequences of the cervical spine, to include the craniocervical junction and cervicothoracic junction, were obtained without and with intravenous contrast. CONTRAST:  5mL GADAVIST GADOBUTROL 1 MMOL/ML IV SOLN COMPARISON:  CT of the  cervical spine 11/22/2022 FINDINGS: Alignment: 3 mm anterolisthesis is present at C3-4. 2 mm anterolisthesis is present at C4-5. Straightening of the normal cervical lordosis is present. Vertebrae: Signal and enhancement is present diffusely in the C4 and C5 vertebral bodies. Abnormal enhancement extends into the posterior elements both levels. Cord: Cord is compressed the C4-5 level less than 6 mm. Increased T2 signal is present within the cord at C4 and C5. Extensive dural enhancement extends C2 through T1-2 posteriorly. This is most prominent from C2-3 through C7. A right ventral epidural abscess measures 6 x 2 x 12 mm posterior to the C5 vertebral body. A left ventral epidural abscess measures 4 x 2 x 10 mm posterior to the C5 vertebral body. Cord size and signal is normal above the C3 vertebral level and below the C6 vertebral level. Posterior Fossa, vertebral arteries, paraspinal tissues: Prevertebral edema and enhancement extends from the C1-2 level through C6. No abnormal fluid or enhancement extends into the mediastinum. The craniocervical junction is normal. Vertebral arteries are patent bilaterally. Disc levels: C2-3: Abnormal dural enhancement is present without focal stenosis. C3-4: More prominent circumferential dural enhancement is present. Uncovertebral spurring contributes to mild right foraminal stenosis. C4-5: Extensive epidural enhancement compresses the spinal cord to less than 6 mm. Enhancing soft tissue extends into the foramina bilaterally. Severe left foraminal stenosis is secondary to uncovertebral and facet hypertrophy. C5-6: Circumferential epidural enhancement is present. Moderate foraminal stenosis is present bilaterally. C6-7: Abnormal dural and epidural enhancement is present without significant central canal stenosis. Abnormal enhancement extends into the foramina. C7-T1: Mild posterior dural enhancement is present. No focal disc disease or stenosis is present. IMPRESSION: 1.  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 2. Extensive dural enhancement extends from the C1-2 level through T1-2 posteriorly. This is most prominent from C2-3 through C7. 3. Increased T2 signal is present within the cervical spinal cord at C4 and C5 consistent with spinal cord edema. 4. Prevertebral edema and enhancement extends from the C1-2 level through C6. No extension into the mediastinum. 5. Severe left foraminal stenosis at C4-5. 6. Moderate foraminal stenosis bilaterally at C5-6. 7. Mild right foraminal stenosis at C3-4. 8. Mild posterior dural enhancement at C7-T1 without significant central canal stenosis. 9. Abnormal enhancement extends into the foramina bilaterally at C4-5, C5-6, and C6-7. Critical Value/emergent results were called by telephone at the time of interpretation on 11/24/2022 at 2:36 pm to provider [facility] , who verbally acknowledged these results. Electronically Signed   By: Marin Roberts M.D.   On: 11/24/2022 14:49   MR SHOULDER RIGHT W WO CONTRAST  Result Date: 11/24/2022 CLINICAL DATA:  Right shoulder pain.  Bacteremia EXAM: MRI OF THE RIGHT SHOULDER WITHOUT AND WITH CONTRAST TECHNIQUE: Multiplanar, multisequence MR imaging of the right shoulder was performed before and after the administration of intravenous contrast. CONTRAST:  5mL GADAVIST GADOBUTROL 1 MMOL/ML IV SOLN COMPARISON:  X-ray 11/22/2022 FINDINGS: Technical Note: Despite efforts by the technologist  and patient, motion artifact is present on today's exam and could not be eliminated. This reduces exam sensitivity and specificity. Rotator cuff: Full thickness, near-complete tear of the distal supraspinatus tendon with retraction to the level of the humeral head apex. Partial-thickness tearing involving the cranial aspect of the distal subscapularis tendon. Intact infraspinatus and teres minor tendons.  Muscles: Preserved bulk and signal intensity of the rotator cuff musculature without edema, atrophy, or fatty infiltration. Biceps long head: Intra-articular biceps tendinosis. Biceps tendon is appropriately positioned within the bicipital groove. Acromioclavicular Joint: Minimal degenerative changes of the AC joint. Small volume subacromial-subdeltoid bursal fluid communicating with the glenohumeral joint. Glenohumeral Joint: Small glenohumeral joint effusion with mild enhancing synovitis. Mild diffuse chondral thinning of the glenohumeral joint. Labrum:  Appears degenerated, although suboptimally assessed. Bones: No acute fracture. No dislocation. Reactive subcortical cystic changes within the posterior aspect of the greater tuberosity. No erosion. No marrow replacing bone lesion. Other: None. IMPRESSION: RIGHT SHOULDER: 1. Small glenohumeral joint effusion with relatively mild synovitis. This is favored to be reactive although septic arthritis is not entirely excluded in the setting of bacteremia. 2. Full-thickness, near-complete tear of the distal supraspinatus tendon with retraction to the level of the humeral head apex. 3. Partial-thickness tearing of the distal subscapularis tendon. 4. Intra-articular biceps tendinosis. Electronically Signed   By: Duanne Guess D.O.   On: 11/24/2022 14:06   MR Shoulder Left W Wo Contrast  Result Date: 11/24/2022 CLINICAL DATA:  Shoulder pain, chronic, no prior imaging. Bacteremia EXAM: MRI OF THE LEFT SHOULDER WITHOUT AND WITH CONTRAST TECHNIQUE: Multiplanar, multisequence MR imaging of the left shoulder was performed before and after the administration of intravenous contrast. CONTRAST:  5mL GADAVIST GADOBUTROL 1 MMOL/ML IV SOLN COMPARISON:  X-ray 11/22/2022 FINDINGS: Technical Note: Despite efforts by the technologist and patient, motion artifact is present on today's exam and could not be eliminated. This reduces exam sensitivity and specificity. Rotator cuff:  Full-thickness, near complete tear of the subscapularis tendon with minimal retraction. Severe supraspinatus and infraspinatus tendinosis. High-grade partial-thickness tear along the anterior leading edge of the distal supraspinatus tendon. Intact teres minor. Muscles: Preserved bulk and signal intensity of the rotator cuff musculature without edema, atrophy, or fatty infiltration. Biceps long head: Long head biceps tendon is medially dislocated out of the bicipital groove. Acromioclavicular Joint: Moderate degenerative changes of the AC joint. Trace subacromial-subdeltoid bursal fluid. Glenohumeral Joint: Small glenohumeral joint effusion with relatively mild synovitis. No cartilage defect. Labrum:  Suboptimally assessed. Bones: No acute fracture. No dislocation. Degenerative subchondral marrow edema centered at the [facility] joint with small subchondral cystic changes. No erosion. No marrow replacing bone lesion. Other: None. IMPRESSION: LEFT SHOULDER: 1. Small glenohumeral joint effusion with relatively mild synovitis. This is favored to be reactive although septic arthritis is not entirely excluded in the setting of bacteremia. 2. Full-thickness, near-complete tear of the subscapularis tendon with minimal retraction. 3. Severe supraspinatus and infraspinatus tendinosis with high-grade partial-thickness tear along the anterior leading edge of the distal supraspinatus tendon. 4. Medial dislocation of the long head biceps tendon out of the bicipital groove. 5. Moderate AC joint osteoarthritis. Electronically Signed   By: Duanne Guess D.O.   On: 11/24/2022 14:01   ECHOCARDIOGRAM COMPLETE  Result Date: 11/23/2022    ECHOCARDIOGRAM REPORT   Patient Name:   [facility]  Date of Exam: 11/23/2022 Medical Rec #:  [id number]  Height:       63.0 in Accession #:    [id number] Weight:  121.3 lb Date of Birth:  [birth year]  BSA:          1.563 m Patient Age:    30 years   BP:           157/69 mmHg Patient Gender: M           HR:           82 bpm. Exam Location:  Inpatient Procedure: 2D Echo, Color Doppler and Cardiac Doppler Indications:    Bacteremia  History:        Patient has no prior history of Echocardiogram examinations.                 Risk Factors:Hypertension and Dyslipidemia.  Sonographer:    Irving Burton Senior RDCS Referring Phys: [phone] DAVID MANUEL ORTIZ  Sonographer Comments: Suboptimal apical window due to lung interference. IMPRESSIONS  1. Left ventricular ejection fraction, by estimation, is 55 to 60%. The left ventricle has normal function. The left ventricle has no regional wall motion abnormalities. Left ventricular diastolic parameters were normal.  2. Right ventricular systolic function is normal. The right ventricular size is normal. There is mildly elevated pulmonary artery systolic pressure. The estimated right ventricular systolic pressure is 38.7 mmHg.  3. Left atrial size was mildly dilated.  4. The mitral valve is normal in structure. Mild mitral valve regurgitation. No evidence of mitral stenosis.  5. The aortic valve is tricuspid. There is mild thickening of the aortic valve. Aortic valve regurgitation is moderate. No aortic stenosis is present.  6. 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  7. The inferior vena cava is normal in size with <50% respiratory variability, suggesting right atrial pressure of 8 mmHg. Comparison(s): Regurgitant valve lesions raise concern for endocarditis in setting of bacteremia, consider TEE if clinically indicated. FINDINGS  Left Ventricle: Left ventricular ejection fraction, by estimation, is 55 to 60%. The left ventricle has normal function. The left ventricle has no regional wall motion abnormalities. The left ventricular internal cavity size was normal in size. There is  borderline left ventricular hypertrophy. Left ventricular diastolic parameters were normal. Right Ventricle: The right ventricular size is normal. No increase in right ventricular wall thickness. Right  ventricular systolic function is normal. There is mildly elevated pulmonary artery systolic pressure. The tricuspid regurgitant velocity is 2.77  m/s, and with an assumed right atrial pressure of 8 mmHg, the estimated right ventricular systolic pressure is 38.7 mmHg. Left Atrium: Left atrial size was mildly dilated. Right Atrium: Right atrial size was normal in size. Pericardium: There is no evidence of pericardial effusion. Mitral Valve: The mitral valve is normal in structure. Mild mitral valve regurgitation. No evidence of mitral valve stenosis. Tricuspid Valve: The tricuspid valve is normal in structure. Tricuspid valve regurgitation is mild . No evidence of tricuspid stenosis. Aortic Valve: The aortic valve is tricuspid. There is mild thickening of the aortic valve. Aortic valve regurgitation is moderate. Aortic regurgitation PHT measures 416 msec. No aortic stenosis is present. Pulmonic Valve: The pulmonic valve was not well visualized. 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 No evidence of pulmonic stenosis. Aorta: The aortic root and ascending aorta are structurally normal, with no evidence of dilitation. Venous: The inferior vena cava is normal in size with less than 50% respiratory variability, suggesting right atrial pressure of 8 mmHg. IAS/Shunts: The atrial septum is grossly normal.  LEFT VENTRICLE PLAX 2D LVIDd:         4.90 cm   Diastology LVIDs:  3.20 cm   LV e' medial:    10.40 cm/s LV PW:         1.00 cm   LV E/e' medial:  8.0 LV IVS:        1.00 cm   LV e' lateral:   11.70 cm/s LVOT diam:     2.10 cm   LV E/e' lateral: 7.1 LV SV:         73 LV SV Index:   47 LVOT Area:     3.46 cm  RIGHT VENTRICLE RV S prime:     9.90 cm/s TAPSE (M-mode): 2.1 cm LEFT ATRIUM             Index        RIGHT ATRIUM           Index LA diam:        3.70 cm 2.37 cm/m   RA Area:     16.10 cm LA Vol (A2C):   57.8 ml 36.98 ml/m  RA Volume:   35.40 ml  22.65 ml/m LA Vol (A4C):   48.4 ml 30.96 ml/m LA Biplane  Vol: 53.9 ml 34.48 ml/m  AORTIC VALVE LVOT Vmax:   97.30 cm/s LVOT Vmean:  72.600 cm/s LVOT VTI:    0.212 m AI PHT:      416 msec  AORTA Ao Root diam: 3.60 cm Ao Asc diam:  3.60 cm MITRAL VALVE               TRICUSPID VALVE MV Area (PHT): 3.42 cm    TR Peak grad:   30.7 mmHg MV Decel Time: 222 msec    TR Vmax:        277.00 cm/s MV E velocity: 83.60 cm/s MV A velocity: 78.00 cm/s  SHUNTS MV E/A ratio:  1.07        Systemic VTI:  0.21 m                            Systemic Diam: 2.10 cm Weston Brass MD Electronically signed by Weston Brass MD Signature Date/Time: 11/23/2022/5:24:19 PM    Final    CT ABDOMEN PELVIS W CONTRAST  Result Date: 11/23/2022 CLINICAL DATA:  Suspected pulmonary embolism. EXAM: CT ANGIOGRAPHY CHEST CT ABDOMEN AND PELVIS WITH CONTRAST TECHNIQUE: Multidetector CT imaging of the chest was performed using the standard protocol during bolus administration of intravenous contrast. Multiplanar CT image reconstructions and MIPs were obtained to evaluate the vascular anatomy. Multidetector CT imaging of the abdomen and pelvis was performed using the standard protocol during bolus administration of intravenous contrast. RADIATION DOSE REDUCTION: This exam was performed according to the departmental dose-optimization program which includes automated exposure control, adjustment of the mA and/or kV according to patient size and/or use of iterative reconstruction technique. CONTRAST:  80mL OMNIPAQUE IOHEXOL 350 MG/ML SOLN COMPARISON:  November 09, 2014. FINDINGS: CTA CHEST FINDINGS Cardiovascular: Satisfactory opacification of the pulmonary arteries to the segmental level. No evidence of pulmonary embolism. Normal heart size. No pericardial effusion. Coronary artery calcifications are noted. Mediastinum/Nodes: No enlarged mediastinal, hilar, or axillary lymph nodes. Thyroid gland, trachea, and esophagus demonstrate no significant findings. Lungs/Pleura: No pneumothorax or pleural effusion is noted.  Mild emphysematous disease is noted. Minimal right posterior basilar subsegmental atelectasis is noted. Musculoskeletal: No chest wall abnormality. No acute or significant osseous findings. Review of the MIP images confirms the above findings. CT ABDOMEN and PELVIS FINDINGS Hepatobiliary: No focal liver abnormality  is seen. No gallstones, gallbladder wall thickening, or biliary dilatation. Pancreas: Unremarkable. No pancreatic ductal dilatation or surrounding inflammatory changes. Spleen: Normal in size without focal abnormality. Adrenals/Urinary Tract: Adrenal glands appear normal. Left renal cysts are noted for which no further follow-up is required. No hydronephrosis or renal obstruction is noted. Mild urinary bladder distention is noted. Stomach/Bowel: Stomach is within normal limits. Appendix appears normal. No evidence of bowel wall thickening, distention, or inflammatory changes. Vascular/Lymphatic: Aortic atherosclerosis. No enlarged abdominal or pelvic lymph nodes. Reproductive: Prostate is unremarkable. Other: No abdominal wall hernia or abnormality. No abdominopelvic ascites. Musculoskeletal: No acute or significant osseous findings. Review of the MIP images confirms the above findings. IMPRESSION: No definite evidence of pulmonary embolus. Mild urinary bladder distention is noted. Coronary artery calcifications are noted. Aortic Atherosclerosis (ICD10-I70.0) and Emphysema (ICD10-J43.9). Electronically Signed   By: Lupita Raider M.D.   On: 11/23/2022 15:47   CT Angio Chest Pulmonary Embolism (PE) W or WO Contrast  Result Date: 11/23/2022 CLINICAL DATA:  Suspected pulmonary embolism. EXAM: CT ANGIOGRAPHY CHEST CT ABDOMEN AND PELVIS WITH CONTRAST TECHNIQUE: Multidetector CT imaging of the chest was performed using the standard protocol during bolus administration of intravenous contrast. Multiplanar CT image reconstructions and MIPs were obtained to evaluate the vascular anatomy. Multidetector CT  imaging of the abdomen and pelvis was performed using the standard protocol during bolus administration of intravenous contrast. RADIATION DOSE REDUCTION: This exam was performed according to the departmental dose-optimization program which includes automated exposure control, adjustment of the mA and/or kV according to patient size and/or use of iterative reconstruction technique. CONTRAST:  80mL OMNIPAQUE IOHEXOL 350 MG/ML SOLN COMPARISON:  November 09, 2014. FINDINGS: CTA CHEST FINDINGS Cardiovascular: Satisfactory opacification of the pulmonary arteries to the segmental level. No evidence of pulmonary embolism. Normal heart size. No pericardial effusion. Coronary artery calcifications are noted. Mediastinum/Nodes: No enlarged mediastinal, hilar, or axillary lymph nodes. Thyroid gland, trachea, and esophagus demonstrate no significant findings. Lungs/Pleura: No pneumothorax or pleural effusion is noted. Mild emphysematous disease is noted. Minimal right posterior basilar subsegmental atelectasis is noted. Musculoskeletal: No chest wall abnormality. No acute or significant osseous findings. Review of the MIP images confirms the above findings. CT ABDOMEN and PELVIS FINDINGS Hepatobiliary: No focal liver abnormality is seen. No gallstones, gallbladder wall thickening, or biliary dilatation. Pancreas: Unremarkable. No pancreatic ductal dilatation or surrounding inflammatory changes. Spleen: Normal in size without focal abnormality. Adrenals/Urinary Tract: Adrenal glands appear normal. Left renal cysts are noted for which no further follow-up is required. No hydronephrosis or renal obstruction is noted. Mild urinary bladder distention is noted. Stomach/Bowel: Stomach is within normal limits. Appendix appears normal. No evidence of bowel wall thickening, distention, or inflammatory changes. Vascular/Lymphatic: Aortic atherosclerosis. No enlarged abdominal or pelvic lymph nodes. Reproductive: Prostate is unremarkable.  Other: No abdominal wall hernia or abnormality. No abdominopelvic ascites. Musculoskeletal: No acute or significant osseous findings. Review of the MIP images confirms the above findings. IMPRESSION: No definite evidence of pulmonary embolus. Mild urinary bladder distention is noted. Coronary artery calcifications are noted. Aortic Atherosclerosis (ICD10-I70.0) and Emphysema (ICD10-J43.9). Electronically Signed   By: Lupita Raider M.D.   On: 11/23/2022 15:47   CT Head Wo Contrast  Result Date: 11/22/2022 CLINICAL DATA:  falls; Neck pain, chronic falls, neck pain. EXAM: CT HEAD WITHOUT CONTRAST CT CERVICAL SPINE WITHOUT CONTRAST TECHNIQUE: Multidetector CT imaging of the head and cervical spine was performed following the standard protocol without intravenous contrast. Multiplanar CT image reconstructions of the  cervical spine were also generated. RADIATION DOSE REDUCTION: This exam was performed according to the departmental dose-optimization program which includes automated exposure control, adjustment of the mA and/or kV according to patient size and/or use of iterative reconstruction technique. COMPARISON:  None Available. FINDINGS: CT HEAD FINDINGS Brain: No acute intracranial hemorrhage. Gray-white differentiation is preserved. No hydrocephalus or extra-axial collection. No mass effect or midline shift. Vascular: No hyperdense vessel or unexpected calcification. Skull: No calvarial fracture or suspicious bone lesion. Skull base is unremarkable. Sinuses/Orbits: No acute finding. Other: None. CT CERVICAL SPINE FINDINGS Alignment: 3 mm degenerative anterolisthesis of C4 on C5 with focal kyphosis at this level. No traumatic malalignment. Skull base and vertebrae: No acute fracture. Normal craniocervical junction. No suspicious bone lesions. Soft tissues and spinal canal: No prevertebral fluid or swelling. No visible canal hematoma. Disc levels: Multilevel cervical spondylosis, worst at C5-6, where there is  mild spinal canal stenosis. Upper chest: Emphysema and scarring in the lung apices. Other: Atherosclerotic calcifications of the carotid bulbs. IMPRESSION: 1. No acute intracranial abnormality. 2. No acute cervical spine fracture or traumatic malalignment. 3. Multilevel cervical spondylosis, worst at C5-6, where there is mild spinal canal stenosis. Electronically Signed   By: Orvan Falconer M.D.   On: 11/22/2022 15:06   CT Cervical Spine Wo Contrast  Result Date: 11/22/2022 CLINICAL DATA:  falls; Neck pain, chronic falls, neck pain. EXAM: CT HEAD WITHOUT CONTRAST CT CERVICAL SPINE WITHOUT CONTRAST TECHNIQUE: Multidetector CT imaging of the head and cervical spine was performed following the standard protocol without intravenous contrast. Multiplanar CT image reconstructions of the cervical spine were also generated. RADIATION DOSE REDUCTION: This exam was performed according to the departmental dose-optimization program which includes automated exposure control, adjustment of the mA and/or kV according to patient size and/or use of iterative reconstruction technique. COMPARISON:  None Available. FINDINGS: CT HEAD FINDINGS Brain: No acute intracranial hemorrhage. Gray-white differentiation is preserved. No hydrocephalus or extra-axial collection. No mass effect or midline shift. Vascular: No hyperdense vessel or unexpected calcification. Skull: No calvarial fracture or suspicious bone lesion. Skull base is unremarkable. Sinuses/Orbits: No acute finding. Other: None. CT CERVICAL SPINE FINDINGS Alignment: 3 mm degenerative anterolisthesis of C4 on C5 with focal kyphosis at this level. No traumatic malalignment. Skull base and vertebrae: No acute fracture. Normal craniocervical junction. No suspicious bone lesions. Soft tissues and spinal canal: No prevertebral fluid or swelling. No visible canal hematoma. Disc levels: Multilevel cervical spondylosis, worst at C5-6, where there is mild spinal canal stenosis. Upper  chest: Emphysema and scarring in the lung apices. Other: Atherosclerotic calcifications of the carotid bulbs. IMPRESSION: 1. No acute intracranial abnormality. 2. No acute cervical spine fracture or traumatic malalignment. 3. Multilevel cervical spondylosis, worst at C5-6, where there is mild spinal canal stenosis. Electronically Signed   By: Orvan Falconer M.D.   On: 11/22/2022 15:06   DG Chest Port 1 View  Result Date: 11/22/2022 CLINICAL DATA:  Fever. EXAM: PORTABLE CHEST 1 VIEW COMPARISON:  None Available. FINDINGS: Mild cardiomegaly. Minimal bilateral midlung opacities are noted suggesting focal atelectasis or less likely infiltrates. Bony thorax is unremarkable. IMPRESSION: Minimal bilateral midlung opacities are noted suggesting focal atelectasis or less likely infiltrates. Electronically Signed   By: Lupita Raider M.D.   On: 11/22/2022 14:13   DG Shoulder Right  Result Date: 11/22/2022 CLINICAL DATA:  Pain.  Fall. EXAM: RIGHT SHOULDER - 2+ VIEW COMPARISON:  None Available. FINDINGS: Three views of the right shoulder. Mild acromioclavicular joint space narrowing  and peripheral osteophytosis. The glenohumeral joint space is maintained. No acute fracture or dislocation. Mild-to-moderate degenerative disc changes are noted within the thoracic spine. IMPRESSION: Mild acromioclavicular osteoarthritis. Electronically Signed   By: Neita Garnet M.D.   On: 11/22/2022 13:43   DG Shoulder Left  Result Date: 11/22/2022 CLINICAL DATA:  Pain.  Fall. EXAM: LEFT SHOULDER - 2+ VIEW COMPARISON:  None Available. FINDINGS: Frontal and transscapular Y-views of the left shoulder. Mild glenohumeral joint space narrowing. Minimal inferior glenoid degenerative osteophytosis. Mild acromioclavicular subchondral cystic change and peripheral osteophytosis. No acute fracture is seen. No dislocation. Mild-to-moderate atherosclerotic calcifications within the aortic arch. IMPRESSION: Mild glenohumeral and acromioclavicular  osteoarthritis. Electronically Signed   By: Neita Garnet M.D.   On: 11/22/2022 13:42    A/P:  Ather is a very pleasant 72 year old gentleman who does not speak [place] who presents with E. coli bacteremia and complaints of severe neck pain and mild bilateral shoulder pain right worse than the left.  His clinical exam demonstrates no signs or symptoms of septic arthritis of the shoulder.  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  There is small joint effusions and mild synovitis.  No signs of abscess or osteomyelitis.  Given the fact the patient is currently on Lovenox and his clinical exam or imaging studies are not  consistent with a septic process I do not think aspiration is required.  Will defer management of the epidural abscess to the neurosurgical team.  Will reevaluate his clinical exam.  If this changes and recommendations may alter.  If there is any questions or concerns please not hesitate to contact me.

## 2022-11-24 NOTE — Progress Notes (Addendum)
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  I also reviewed the MRI of shoulders which could not rule out septic arthritis along with other abnormal findings.  I have called and spoken with Dr. Elsner/neurosurgery who will review the films and get back to me.  I have made the patient n.p.o. for now.  I have added vancomycin: Patient is already on Rocephin.  I will order repeat blood cultures.  I have also requested ID consultation and have sent a secure chat message to Dr. Renold Don.  I have also spoken to Dr. Lita Mains: Requested consult.  He recommended that epidural abscess be addressed first by neurosurgery; orthopedics will see the patient in consultation.  I have called Dr. Gary Fleet office and given the patient information to the office.  I have called and spoken to son/Johnny on phone and notified him of the results of the MRI and plan for now.  2D echo had shown valvular regurgitation.  I discussed with Dr. Skains/cardiology who reviewed the echo and suggested that TEE might be helpful.  Cardiology was trying to schedule the patient for TEE.  I have notified cardiology that we need to probably hold off on the plans for TEE till we get clearance from neurosurgery.  I have subsequently spoken to Dr. Danielle Dess again on phone who recommended that patient be transferred to [facility].  He will see the patient once patient gets there.  I have discontinued subcutaneous Lovenox for DVT prophylaxis and will also discontinue aspirin 81 mg.

## 2022-11-24 NOTE — Plan of Care (Signed)

## 2022-11-24 NOTE — Consult Note (Signed)
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 male.  HPI: Patient is a 72 year old [place] male who apparently has had progressive neck and shoulder and arm pain for several days time.  A few days ago he underwent a CT of the cervical spine which did not reveal any abnormality however because the patient had continued pain cultures were done at that time and he was called back because the cultures did turn positive he was on some oral antibiotics is positive for E. coli he has been started on broad-spectrum antibiotics and because he is complaining of more shoulder pain MRI of the cervical spine was performed.  This reveals the presence of a severe epidural abscess with cord compression to 6 mm and some intrinsic cord signal change the patient has generally been feeling weak and he is now to be transferred from [facility] to [facility] for continued treatment and possible surgical intervention.  Past Medical History:  Diagnosis Date   Hyperlipemia    Hypertension     History reviewed. No pertinent surgical history.  Family History  Problem Relation Age of Onset   Heart disease Mother    Hypertension Mother    Hyperlipidemia Mother    Hyperlipidemia Other     Social History:  reports that he has been smoking cigarettes. He has a 40 pack-year smoking history. He does not have any smokeless tobacco history on file. He reports that he does not drink alcohol and does not use drugs.  Allergies: No Known Allergies  Medications: I have not reviewed the patient's current medications  Results for orders placed or performed during the hospital encounter of 11/23/22 (from the past 48 hour(s))  Protime-INR     Status: None   Collection Time: 11/23/22  2:00 PM  Result Value Ref Range   Prothrombin Time 13.8 11.4 - 15.2 seconds   INR 1.0 0.8 - 1.2    Comment: (NOTE) INR  goal varies based on device and disease states. Performed at [facility], [address] [place], [place] [address]   CBC with Differential/Platelet     Status: Abnormal   Collection Time: 11/23/22  2:00 PM  Result Value Ref Range   WBC 8.7 4.0 - 10.5 K/uL   RBC 4.43 4.22 - 5.81 MIL/uL   Hemoglobin 12.8 (L) 13.0 - 17.0 g/dL   HCT 95.6 (L) 21.3 - 08.6 %   MCV 87.1 80.0 - 100.0 fL   MCH 28.9 26.0 - 34.0 pg   MCHC 33.2 30.0 - 36.0 g/dL   RDW 57.8 46.9 - 62.9 %   Platelets 122 (L) 150 - 400 K/uL   nRBC 0.0 0.0 - 0.2 %   Neutrophils Relative % 76 %   Neutro Abs 6.7 1.7 - 7.7 K/uL   Lymphocytes Relative 12 %   Lymphs Abs 1.0 0.7 - 4.0 K/uL   Monocytes Relative 9 %   Monocytes Absolute 0.7 0.1 - 1.0 K/uL   Eosinophils Relative 2 %   Eosinophils Absolute 0.1 0.0 - 0.5 K/uL   Basophils Relative 0 %   Basophils Absolute 0.0 0.0 - 0.1 K/uL   Immature Granulocytes 1 %   Abs Immature Granulocytes 0.11 (H) 0.00 - 0.07 K/uL    Comment: Performed at [facility], [address] [place], [place] [address]  Magnesium     Status: None   Collection  Time: 11/23/22  2:00 PM  Result Value Ref Range   Magnesium 2.3 1.7 - 2.4 mg/dL    Comment: Performed at [facility], [address], [place], [place] [address]  Phosphorus     Status: None   Collection Time: 11/23/22  2:00 PM  Result Value Ref Range   Phosphorus 4.1 2.5 - 4.6 mg/dL    Comment: Performed at [facility], [address] [place], [place] [address]  Hepatic function panel     Status: Abnormal   Collection Time: 11/23/22  2:00 PM  Result Value Ref Range   Total Protein 6.6 6.5 - 8.1 g/dL   Albumin 2.6 (L) 3.5 - 5.0 g/dL   AST 25 15 - 41 U/L   ALT 29 0 - 44 U/L   Alkaline Phosphatase 157 (H) 38 - 126 U/L   Total Bilirubin 1.5 (H) 0.3 - 1.2 mg/dL   Bilirubin, Direct 0.6 (H) 0.0 - 0.2 mg/dL   Indirect Bilirubin 0.9 0.3 - 0.9 mg/dL    Comment: Performed  at [facility], [address] [place], [place] [address]  Basic metabolic panel     Status: Abnormal   Collection Time: 11/23/22  2:00 PM  Result Value Ref Range   Sodium 134 (L) 135 - 145 mmol/L   Potassium 4.0 3.5 - 5.1 mmol/L   Chloride 104 98 - 111 mmol/L   CO2 16 (L) 22 - 32 mmol/L   Glucose, Bld 71 70 - 99 mg/dL    Comment: Glucose reference range applies only to samples taken after fasting for at least 8 hours.   BUN 41 (H) 8 - 23 mg/dL   Creatinine, Ser 9.14 0.61 - 1.24 mg/dL   Calcium 8.0 (L) 8.9 - 10.3 mg/dL   GFR, Estimated >78 >29 mL/min    Comment: (NOTE) Calculated using the CKD-EPI Creatinine Equation (2021)    Anion gap 14 5 - 15    Comment: Performed at [facility], [address], [place], [place] [address]  APTT     Status: None   Collection Time: 11/23/22  2:00 PM  Result Value Ref Range   aPTT 33 24 - 36 seconds    Comment: Performed at [facility], [address] [place], [place] [address]  CBC     Status: Abnormal   Collection Time: 11/24/22  6:37 AM  Result Value Ref Range   WBC 9.4 4.0 - 10.5 K/uL   RBC 3.97 (L) 4.22 - 5.81 MIL/uL   Hemoglobin 11.4 (L) 13.0 - 17.0 g/dL   HCT 84.6 (L) 96.2 - 95.2 %   MCV 87.7 80.0 - 100.0 fL   MCH 28.7 26.0 - 34.0 pg   MCHC 32.8 30.0 - 36.0 g/dL   RDW 84.1 32.4 - 40.1 %   Platelets 122 (L) 150 - 400 K/uL   nRBC 0.0 0.0 - 0.2 %    Comment: Performed at [facility], [address], [place], [place] [address]  Comprehensive metabolic panel     Status: Abnormal   Collection Time: 11/24/22  6:37 AM  Result Value Ref Range   Sodium 137 135 - 145 mmol/L   Potassium 3.8 3.5 - 5.1 mmol/L   Chloride 109 98 - 111 mmol/L   CO2 14 (L) 22 - 32 mmol/L   Glucose, Bld 69 (L) 70 - 99 mg/dL    Comment: Glucose reference range applies only to samples taken after fasting  for at least 8 hours.   BUN 31 (H) 8 - 23 mg/dL   Creatinine, Ser 6.21 0.61 -  1.24 mg/dL   Calcium 7.8 (L) 8.9 - 10.3 mg/dL   Total Protein 6.0 (L) 6.5 - 8.1 g/dL   Albumin 2.4 (L) 3.5 - 5.0 g/dL   AST 27 15 - 41 U/L   ALT 28 0 - 44 U/L   Alkaline Phosphatase 184 (H) 38 - 126 U/L   Total Bilirubin 1.6 (H) 0.3 - 1.2 mg/dL   GFR, Estimated >30 >86 mL/min    Comment: (NOTE) Calculated using the CKD-EPI Creatinine Equation (2021)    Anion gap 14 5 - 15    Comment: Performed at [facility], [address], [place], [place] [address]  Vitamin B12     Status: Abnormal   Collection Time: 11/24/22  6:37 AM  Result Value Ref Range   Vitamin B-12 1,366 (H) 180 - 914 pg/mL    Comment: (NOTE) This assay is not validated for testing neonatal or myeloproliferative syndrome specimens for Vitamin B12 levels. Performed at [facility], [address], [place], [place] [address]   Folate     Status: None   Collection Time: 11/24/22  6:37 AM  Result Value Ref Range   Folate 6.8 >5.9 ng/mL    Comment: Performed at [facility], [address] [place], [place] [address]  Iron and TIBC     Status: Abnormal   Collection Time: 11/24/22  6:37 AM  Result Value Ref Range   Iron 18 (L) 45 - 182 ug/dL   TIBC 244 (L) 010 - 272 ug/dL   Saturation Ratios 8 (L) 17.9 - 39.5 %   UIBC 209 ug/dL    Comment: Performed at [facility], [address], [place], [place] [address]  Ferritin     Status: Abnormal   Collection Time: 11/24/22  6:37 AM  Result Value Ref Range   Ferritin 407 (H) 24 - 336 ng/mL    Comment: Performed at [facility], [address], [place], [place] [address]  Reticulocytes     Status: Abnormal   Collection Time: 11/24/22  6:37 AM  Result Value Ref Range   Retic Ct Pct 0.4 0.4 - 3.1 %   RBC. 3.97 (L) 4.22 - 5.81 MIL/uL   Retic Count, Absolute 16.8 (L) 19.0 - 186.0 K/uL   Immature Retic Fract 9.2 2.3 - 15.9 %    Comment: Performed at [facility],  [address] [place], [place] [address]    MR CERVICAL SPINE W WO CONTRAST  Result Date: 11/24/2022 CLINICAL DATA:  Cervical radiculopathy. Infection suspected. Severe neck and shoulder pain. Bacteremia. EXAM: MRI CERVICAL SPINE WITHOUT AND WITH CONTRAST TECHNIQUE: Multiplanar and multiecho pulse sequences of the cervical spine, to include the craniocervical junction and cervicothoracic junction, were obtained without and with intravenous contrast. CONTRAST:  5mL GADAVIST GADOBUTROL 1 MMOL/ML IV SOLN COMPARISON:  CT of the cervical spine 11/22/2022 FINDINGS: Alignment: 3 mm anterolisthesis is present at C3-4. 2 mm anterolisthesis is present at C4-5. Straightening of the normal cervical lordosis is present. Vertebrae: Signal and enhancement is present diffusely in the C4 and C5 vertebral bodies. Abnormal enhancement extends into the posterior elements both levels. Cord: Cord is compressed the C4-5 level less than 6 mm. Increased T2 signal is present within the cord at C4 and C5. Extensive dural enhancement extends C2 through T1-2 posteriorly. This is most  prominent from C2-3 through C7. A right ventral epidural abscess measures 6 x 2 x 12 mm posterior to the C5 vertebral body. A left ventral epidural abscess measures 4 x 2 x 10 mm posterior to the C5 vertebral body. Cord size and signal is normal above the C3 vertebral level and below the C6 vertebral level. Posterior Fossa, vertebral arteries, paraspinal tissues: Prevertebral edema and enhancement extends from the C1-2 level through C6. No abnormal fluid or enhancement extends into the mediastinum. The craniocervical junction is normal. Vertebral arteries are patent bilaterally. Disc levels: C2-3: Abnormal dural enhancement is present without focal stenosis. C3-4: More prominent circumferential dural enhancement is present. Uncovertebral spurring contributes to mild right foraminal stenosis. C4-5: Extensive epidural enhancement compresses the spinal cord to  less than 6 mm. Enhancing soft tissue extends into the foramina bilaterally. Severe left foraminal stenosis is secondary to uncovertebral and facet hypertrophy. C5-6: Circumferential epidural enhancement is present. Moderate foraminal stenosis is present bilaterally. C6-7: Abnormal dural and epidural enhancement is present without significant central canal stenosis. Abnormal enhancement extends into the foramina. C7-T1: Mild posterior dural enhancement is present. No focal disc disease or stenosis is present. IMPRESSION: 1. 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 2. Extensive dural enhancement extends from the C1-2 level through T1-2 posteriorly. This is most prominent from C2-3 through C7. 3. Increased T2 signal is present within the cervical spinal cord at C4 and C5 consistent with spinal cord edema. 4. Prevertebral edema and enhancement extends from the C1-2 level through C6. No extension into the mediastinum. 5. Severe left foraminal stenosis at C4-5. 6. Moderate foraminal stenosis bilaterally at C5-6. 7. Mild right foraminal stenosis at C3-4. 8. Mild posterior dural enhancement at C7-T1 without significant central canal stenosis. 9. Abnormal enhancement extends into the foramina bilaterally at C4-5, C5-6, and C6-7. Critical Value/emergent results were called by telephone at the time of interpretation on 11/24/2022 at 2:36 pm to provider [facility] , who verbally acknowledged these results. Electronically Signed   By: Marin Roberts M.D.   On: 11/24/2022 14:49   MR SHOULDER RIGHT W WO CONTRAST  Result Date: 11/24/2022 CLINICAL DATA:  Right shoulder pain.  Bacteremia EXAM: MRI OF THE RIGHT SHOULDER WITHOUT AND WITH CONTRAST TECHNIQUE: Multiplanar, multisequence MR imaging of the right shoulder was performed before and after the administration of intravenous contrast. CONTRAST:  5mL GADAVIST  GADOBUTROL 1 MMOL/ML IV SOLN COMPARISON:  X-ray 11/22/2022 FINDINGS: Technical Note: Despite efforts by the technologist and patient, motion artifact is present on today's exam and could not be eliminated. This reduces exam sensitivity and specificity. Rotator cuff: Full thickness, near-complete tear of the distal supraspinatus tendon with retraction to the level of the humeral head apex. Partial-thickness tearing involving the cranial aspect of the distal subscapularis tendon. Intact infraspinatus and teres minor tendons. Muscles: Preserved bulk and signal intensity of the rotator cuff musculature without edema, atrophy, or fatty infiltration. Biceps long head: Intra-articular biceps tendinosis. Biceps tendon is appropriately positioned within the bicipital groove. Acromioclavicular Joint: Minimal degenerative changes of the AC joint. Small volume subacromial-subdeltoid bursal fluid communicating with the glenohumeral joint. Glenohumeral Joint: Small glenohumeral joint effusion with mild enhancing synovitis. Mild diffuse chondral thinning of the glenohumeral joint. Labrum:  Appears degenerated, although suboptimally assessed. Bones: No acute fracture. No dislocation. Reactive subcortical cystic changes within the posterior aspect of the greater tuberosity.  No erosion. No marrow replacing bone lesion. Other: None. IMPRESSION: RIGHT SHOULDER: 1. Small glenohumeral joint effusion with relatively mild synovitis. This is favored to be reactive although septic arthritis is not entirely excluded in the setting of bacteremia. 2. Full-thickness, near-complete tear of the distal supraspinatus tendon with retraction to the level of the humeral head apex. 3. Partial-thickness tearing of the distal subscapularis tendon. 4. Intra-articular biceps tendinosis. Electronically Signed   By: Duanne Guess D.O.   On: 11/24/2022 14:06   MR Shoulder Left W Wo Contrast  Result Date: 11/24/2022 CLINICAL DATA:  Shoulder pain,  chronic, no prior imaging. Bacteremia EXAM: MRI OF THE LEFT SHOULDER WITHOUT AND WITH CONTRAST TECHNIQUE: Multiplanar, multisequence MR imaging of the left shoulder was performed before and after the administration of intravenous contrast. CONTRAST:  5mL GADAVIST GADOBUTROL 1 MMOL/ML IV SOLN COMPARISON:  X-ray 11/22/2022 FINDINGS: Technical Note: Despite efforts by the technologist and patient, motion artifact is present on today's exam and could not be eliminated. This reduces exam sensitivity and specificity. Rotator cuff: Full-thickness, near complete tear of the subscapularis tendon with minimal retraction. Severe supraspinatus and infraspinatus tendinosis. High-grade partial-thickness tear along the anterior leading edge of the distal supraspinatus tendon. Intact teres minor. Muscles: Preserved bulk and signal intensity of the rotator cuff musculature without edema, atrophy, or fatty infiltration. Biceps long head: Long head biceps tendon is medially dislocated out of the bicipital groove. Acromioclavicular Joint: Moderate degenerative changes of the AC joint. Trace subacromial-subdeltoid bursal fluid. Glenohumeral Joint: Small glenohumeral joint effusion with relatively mild synovitis. No cartilage defect. Labrum:  Suboptimally assessed. Bones: No acute fracture. No dislocation. Degenerative subchondral marrow edema centered at the [facility] joint with small subchondral cystic changes. No erosion. No marrow replacing bone lesion. Other: None. IMPRESSION: LEFT SHOULDER: 1. Small glenohumeral joint effusion with relatively mild synovitis. This is favored to be reactive although septic arthritis is not entirely excluded in the setting of bacteremia. 2. Full-thickness, near-complete tear of the subscapularis tendon with minimal retraction. 3. Severe supraspinatus and infraspinatus tendinosis with high-grade partial-thickness tear along the anterior leading edge of the distal supraspinatus tendon. 4. Medial dislocation of  the long head biceps tendon out of the bicipital groove. 5. Moderate AC joint osteoarthritis. Electronically Signed   By: Duanne Guess D.O.   On: 11/24/2022 14:01   ECHOCARDIOGRAM COMPLETE  Result Date: 11/23/2022    ECHOCARDIOGRAM REPORT   Patient Name:   [facility]  Date of Exam: 11/23/2022 Medical Rec #:  [id number]  Height:       63.0 in Accession #:    [id number] Weight:       121.3 lb Date of Birth:  12-Jan-1951  BSA:          1.563 m Patient Age:    71 years   BP:           157/69 mmHg Patient Gender: M          HR:           82 bpm. Exam Location:  Inpatient Procedure: 2D Echo, Color Doppler and Cardiac Doppler Indications:    Bacteremia  History:        Patient has no prior history of Echocardiogram examinations.                 Risk Factors:Hypertension and Dyslipidemia.  Sonographer:    Irving Burton Senior RDCS Referring Phys: [phone] DAVID MANUEL ORTIZ  Sonographer Comments: Suboptimal apical window due to lung interference. IMPRESSIONS  1. Left  ventricular ejection fraction, by estimation, is 55 to 60%. The left ventricle has normal function. The left ventricle has no regional wall motion abnormalities. Left ventricular diastolic parameters were normal.  2. Right ventricular systolic function is normal. The right ventricular size is normal. There is mildly elevated pulmonary artery systolic pressure. The estimated right ventricular systolic pressure is 38.7 mmHg.  3. Left atrial size was mildly dilated.  4. The mitral valve is normal in structure. Mild mitral valve regurgitation. No evidence of mitral stenosis.  5. The aortic valve is tricuspid. There is mild thickening of the aortic valve. Aortic valve regurgitation is moderate. No aortic stenosis is present.  6. 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  7. The inferior vena cava is normal in size with <50% respiratory variability, suggesting right atrial pressure of 8 mmHg. Comparison(s): Regurgitant valve lesions raise concern for endocarditis in setting  of bacteremia, consider TEE if clinically indicated. FINDINGS  Left Ventricle: Left ventricular ejection fraction, by estimation, is 55 to 60%. The left ventricle has normal function. The left ventricle has no regional wall motion abnormalities. The left ventricular internal cavity size was normal in size. There is  borderline left ventricular hypertrophy. Left ventricular diastolic parameters were normal. Right Ventricle: The right ventricular size is normal. No increase in right ventricular wall thickness. Right ventricular systolic function is normal. There is mildly elevated pulmonary artery systolic pressure. The tricuspid regurgitant velocity is 2.77  m/s, and with an assumed right atrial pressure of 8 mmHg, the estimated right ventricular systolic pressure is 38.7 mmHg. Left Atrium: Left atrial size was mildly dilated. Right Atrium: Right atrial size was normal in size. Pericardium: There is no evidence of pericardial effusion. Mitral Valve: The mitral valve is normal in structure. Mild mitral valve regurgitation. No evidence of mitral valve stenosis. Tricuspid Valve: The tricuspid valve is normal in structure. Tricuspid valve regurgitation is mild . No evidence of tricuspid stenosis. Aortic Valve: The aortic valve is tricuspid. There is mild thickening of the aortic valve. Aortic valve regurgitation is moderate. Aortic regurgitation PHT measures 416 msec. No aortic stenosis is present. Pulmonic Valve: The pulmonic valve was not well visualized. 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 No evidence of pulmonic stenosis. Aorta: The aortic root and ascending aorta are structurally normal, with no evidence of dilitation. Venous: The inferior vena cava is normal in size with less than 50% respiratory variability, suggesting right atrial pressure of 8 mmHg. IAS/Shunts: The atrial septum is grossly normal.  LEFT VENTRICLE PLAX 2D LVIDd:         4.90 cm   Diastology LVIDs:         3.20 cm   LV e' medial:    10.40 cm/s  LV PW:         1.00 cm   LV E/e' medial:  8.0 LV IVS:        1.00 cm   LV e' lateral:   11.70 cm/s LVOT diam:     2.10 cm   LV E/e' lateral: 7.1 LV SV:         73 LV SV Index:   47 LVOT Area:     3.46 cm  RIGHT VENTRICLE RV S prime:     9.90 cm/s TAPSE (M-mode): 2.1 cm LEFT ATRIUM             Index        RIGHT ATRIUM           Index LA diam:  3.70 cm 2.37 cm/m   RA Area:     16.10 cm LA Vol (A2C):   57.8 ml 36.98 ml/m  RA Volume:   35.40 ml  22.65 ml/m LA Vol (A4C):   48.4 ml 30.96 ml/m LA Biplane Vol: 53.9 ml 34.48 ml/m  AORTIC VALVE LVOT Vmax:   97.30 cm/s LVOT Vmean:  72.600 cm/s LVOT VTI:    0.212 m AI PHT:      416 msec  AORTA Ao Root diam: 3.60 cm Ao Asc diam:  3.60 cm MITRAL VALVE               TRICUSPID VALVE MV Area (PHT): 3.42 cm    TR Peak grad:   30.7 mmHg MV Decel Time: 222 msec    TR Vmax:        277.00 cm/s MV E velocity: 83.60 cm/s MV A velocity: 78.00 cm/s  SHUNTS MV E/A ratio:  1.07        Systemic VTI:  0.21 m                            Systemic Diam: 2.10 cm Weston Brass MD Electronically signed by Weston Brass MD Signature Date/Time: 11/23/2022/5:24:19 PM    Final    CT ABDOMEN PELVIS W CONTRAST  Result Date: 11/23/2022 CLINICAL DATA:  Suspected pulmonary embolism. EXAM: CT ANGIOGRAPHY CHEST CT ABDOMEN AND PELVIS WITH CONTRAST TECHNIQUE: Multidetector CT imaging of the chest was performed using the standard protocol during bolus administration of intravenous contrast. Multiplanar CT image reconstructions and MIPs were obtained to evaluate the vascular anatomy. Multidetector CT imaging of the abdomen and pelvis was performed using the standard protocol during bolus administration of intravenous contrast. RADIATION DOSE REDUCTION: This exam was performed according to the departmental dose-optimization program which includes automated exposure control, adjustment of the mA and/or kV according to patient size and/or use of iterative reconstruction technique. CONTRAST:  80mL  OMNIPAQUE IOHEXOL 350 MG/ML SOLN COMPARISON:  November 09, 2014. FINDINGS: CTA CHEST FINDINGS Cardiovascular: Satisfactory opacification of the pulmonary arteries to the segmental level. No evidence of pulmonary embolism. Normal heart size. No pericardial effusion. Coronary artery calcifications are noted. Mediastinum/Nodes: No enlarged mediastinal, hilar, or axillary lymph nodes. Thyroid gland, trachea, and esophagus demonstrate no significant findings. Lungs/Pleura: No pneumothorax or pleural effusion is noted. Mild emphysematous disease is noted. Minimal right posterior basilar subsegmental atelectasis is noted. Musculoskeletal: No chest wall abnormality. No acute or significant osseous findings. Review of the MIP images confirms the above findings. CT ABDOMEN and PELVIS FINDINGS Hepatobiliary: No focal liver abnormality is seen. No gallstones, gallbladder wall thickening, or biliary dilatation. Pancreas: Unremarkable. No pancreatic ductal dilatation or surrounding inflammatory changes. Spleen: Normal in size without focal abnormality. Adrenals/Urinary Tract: Adrenal glands appear normal. Left renal cysts are noted for which no further follow-up is required. No hydronephrosis or renal obstruction is noted. Mild urinary bladder distention is noted. Stomach/Bowel: Stomach is within normal limits. Appendix appears normal. No evidence of bowel wall thickening, distention, or inflammatory changes. Vascular/Lymphatic: Aortic atherosclerosis. No enlarged abdominal or pelvic lymph nodes. Reproductive: Prostate is unremarkable. Other: No abdominal wall hernia or abnormality. No abdominopelvic ascites. Musculoskeletal: No acute or significant osseous findings. Review of the MIP images confirms the above findings. IMPRESSION: No definite evidence of pulmonary embolus. Mild urinary bladder distention is noted. Coronary artery calcifications are noted. Aortic Atherosclerosis (ICD10-I70.0) and Emphysema (ICD10-J43.9).  Electronically Signed   By: Lupita Raider  M.D.   On: 11/23/2022 15:47   CT Angio Chest Pulmonary Embolism (PE) W or WO Contrast  Result Date: 11/23/2022 CLINICAL DATA:  Suspected pulmonary embolism. EXAM: CT ANGIOGRAPHY CHEST CT ABDOMEN AND PELVIS WITH CONTRAST TECHNIQUE: Multidetector CT imaging of the chest was performed using the standard protocol during bolus administration of intravenous contrast. Multiplanar CT image reconstructions and MIPs were obtained to evaluate the vascular anatomy. Multidetector CT imaging of the abdomen and pelvis was performed using the standard protocol during bolus administration of intravenous contrast. RADIATION DOSE REDUCTION: This exam was performed according to the departmental dose-optimization program which includes automated exposure control, adjustment of the mA and/or kV according to patient size and/or use of iterative reconstruction technique. CONTRAST:  80mL OMNIPAQUE IOHEXOL 350 MG/ML SOLN COMPARISON:  November 09, 2014. FINDINGS: CTA CHEST FINDINGS Cardiovascular: Satisfactory opacification of the pulmonary arteries to the segmental level. No evidence of pulmonary embolism. Normal heart size. No pericardial effusion. Coronary artery calcifications are noted. Mediastinum/Nodes: No enlarged mediastinal, hilar, or axillary lymph nodes. Thyroid gland, trachea, and esophagus demonstrate no significant findings. Lungs/Pleura: No pneumothorax or pleural effusion is noted. Mild emphysematous disease is noted. Minimal right posterior basilar subsegmental atelectasis is noted. Musculoskeletal: No chest wall abnormality. No acute or significant osseous findings. Review of the MIP images confirms the above findings. CT ABDOMEN and PELVIS FINDINGS Hepatobiliary: No focal liver abnormality is seen. No gallstones, gallbladder wall thickening, or biliary dilatation. Pancreas: Unremarkable. No pancreatic ductal dilatation or surrounding inflammatory changes. Spleen: Normal in size  without focal abnormality. Adrenals/Urinary Tract: Adrenal glands appear normal. Left renal cysts are noted for which no further follow-up is required. No hydronephrosis or renal obstruction is noted. Mild urinary bladder distention is noted. Stomach/Bowel: Stomach is within normal limits. Appendix appears normal. No evidence of bowel wall thickening, distention, or inflammatory changes. Vascular/Lymphatic: Aortic atherosclerosis. No enlarged abdominal or pelvic lymph nodes. Reproductive: Prostate is unremarkable. Other: No abdominal wall hernia or abnormality. No abdominopelvic ascites. Musculoskeletal: No acute or significant osseous findings. Review of the MIP images confirms the above findings. IMPRESSION: No definite evidence of pulmonary embolus. Mild urinary bladder distention is noted. Coronary artery calcifications are noted. Aortic Atherosclerosis (ICD10-I70.0) and Emphysema (ICD10-J43.9). Electronically Signed   By: Lupita Raider M.D.   On: 11/23/2022 15:47    Review of Systems  Unable to perform ROS: Acuity of condition   Blood pressure (!) 177/76, pulse 78, temperature 98.2 F (36.8 C), resp. rate 17, height 5\' 3"  (1.6 m), weight 55 kg, SpO2 95%. 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 Neurological:     Comments: Cranial nerve examination reveals the pupils are 3 mm and briskly reactive the extraocular movements are full the face is symmetric to grimace tongue and uvular in the midline sclera and conjunctiva are clear.  On physical exam at this time that is 6:30 PM 11/25/2022 the patient has deltoid strength that is 4- out of 5 on the right 4 out of 5 on the left bicep strength that is 4 out of 5 on the left 4- out of 5 on the right grip strength that is 4- out of 5 on the right 4 out of 5 on the left tricep strength is 4 out of 5 bilaterally.   Sensation is diminished in the patient's fingertips and hands to pin  and light touch.  Lower extremity strength reveals 4 out of 5 strength in the iliopsoas quadriceps tibialis anterior and gastrocs deep tendon reflexes are absent in the patellae and the Achilles bilaterally.  Sensation appears intact to pin and light touch in the distal lower extremities.     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  Physical assessment when patient arrives to [facility] possible surgical decompression.  Shary Key  11/24/2022, 3:53 PM

## 2022-11-24 NOTE — Progress Notes (Signed)
PROGRESS NOTE    Frederick Andrews  JYN:[id number] DOB: 1950/10/24 DOA: 11/23/2022 PCP: Patient, No Pcp Per   Brief Narrative:  72 y.o. male with medical history significant of hyperlipidemia, hypertension, tobacco use presented with positive blood cultures.  Patient had presented to the ED 1 day prior to presentation after left shoulder pain after having a fall.  Blood cultures were obtained because of complaints of low-grade temperatures.  Subsequently, blood cultures turned positive for gram-negative rods and he was recalled to the ED.  On presentation, WBC of 9.6.  CTA chest showed no PE.  CT abdomen and pelvis with contrast showed mild urinary bladder distention.  On 11/22/2022, patient had CT of the head without contrast which showed no acute intracranial abnormity.  CT cervical spine showed no acute cervical spine fracture or traumatic malalignment but showed multilevel cervical spondylosis, worse at C5-C6 with mild spinal canal stenosis.  He was started on IV antibiotics and fluids.  Assessment & Plan:   E. coli bacteremia Possible UTI -Patient had presented on 11/22/2022 and UA was suggestive of possible UTI.  Blood cultures drawn on that day is positive for E. coli.  Follow sensitivities. -Echo showed EF of 55 to 60% with mild mitral valve regurgitation, moderate aortic regurgitation and severe pulmonary valve regurgitation -Continue Rocephin. -Currently afebrile and hemodynamically stable.  Severe neck and bilateral shoulder pain -Patient had presented on 11/22/2022 after a fall and severe neck and right shoulder pain.  Still in significant pain.  Imaging on presentation as above.  Will get MRI of neck and bilateral shoulders. -Use muscle relaxants and analgesics as needed.  Acute metabolic acidosis -Questionable cause.  Start bicarb drip.  Hypoglycemia -From poor oral intake.  Encourage oral intake.  Normocytic anemia -Questionable cause.  Hemoglobin stable.  Monitor  intermittently  Thrombocytopenia -Questionable cause.  Monitor intermittently.  No signs of bleeding.  DVT prophylaxis: Lovenox Code Status: Full Family Communication: Daughter and son at bedside Disposition Plan: Status is: Inpatient Remains inpatient appropriate because: Of severity of illness    Consultants: None  Procedures: 2D echo  Antimicrobials: Rocephin from 11/23/2022 onwards   Subjective: Patient seen and examined at bedside.  Does not speak English much: Daughter at bedside translated for the patient patient complains of severe neck and bilateral shoulder pain.  No fever or vomiting reported.  Objective: Vitals:   11/23/22 2015 11/23/22 2051 11/24/22 0018 11/24/22 0424  BP: (!) 166/69  (!) 152/66 (!) 186/79  Pulse: 78  79 90  Resp: 18  18 20   Temp: 98.1 F (36.7 C)  99.1 F (37.3 C) 98.6 F (37 C)  TempSrc: Oral   Oral  SpO2: 95% 95% 93% 98%  Weight:      Height:        Intake/Output Summary (Last 24 hours) at 11/24/2022 1132 Last data filed at 11/24/2022 0300 Gross per 24 hour  Intake 1377.37 ml  Output --  Net 1377.37 ml   Filed Weights   11/23/22 0910  Weight: 55 kg    Examination:  General exam: Appears calm and comfortable. : Deconditioned.  On room air. Respiratory system: Bilateral decreased breath sounds at bases Cardiovascular system: S1 & S2 heard, Rate controlled Gastrointestinal system: Abdomen is nondistended, soft and nontender. Normal bowel sounds heard. Extremities: No cyanosis, clubbing, edema  Central nervous system: Awake.  No focal neurological deficits. Moving extremities Skin: No rashes, lesions or ulcers Psychiatry: Flat affect.  Not agitated. Musculoskeletal: Neck and right shoulder tenderness and decreased range  of motion present.  Data Reviewed: I have personally reviewed following labs and imaging studies  CBC: Recent Labs  Lab 11/22/22 1312 11/23/22 1400 11/24/22 0637  WBC 9.6 8.7 9.4  NEUTROABS 7.9* 6.7   --   HGB 13.8 12.8* 11.4*  HCT 41.8 38.6* 34.8*  MCV 86.7 87.1 87.7  PLT 122* 122* 122*   Basic Metabolic Panel: Recent Labs  Lab 11/22/22 1312 11/23/22 1400 11/24/22 0637  NA 135 134* 137  K 4.1 4.0 3.8  CL 102 104 109  CO2 17* 16* 14*  GLUCOSE 103* 71 69*  BUN 36* 41* 31*  CREATININE 1.13 1.03 0.95  CALCIUM 9.0 8.0* 7.8*  MG  --  2.3  --   PHOS  --  4.1  --    GFR: Estimated Creatinine Clearance: 53.9 mL/min (by C-G formula based on SCr of 0.95 mg/dL). Liver Function Tests: Recent Labs  Lab 11/22/22 1312 11/23/22 1400 11/24/22 0637  AST 28 25 27   ALT 32 29 28  ALKPHOS 162* 157* 184*  BILITOT 1.8* 1.5* 1.6*  PROT 8.1 6.6 6.0*  ALBUMIN 3.3* 2.6* 2.4*   No results for input(s): "LIPASE", "AMYLASE" in the last 168 hours. No results for input(s): "AMMONIA" in the last 168 hours. Coagulation Profile: Recent Labs  Lab 11/23/22 1400  INR 1.0   Cardiac Enzymes: No results for input(s): "CKTOTAL", "CKMB", "CKMBINDEX", "TROPONINI" in the last 168 hours. BNP (last 3 results) No results for input(s): "PROBNP" in the last 8760 hours. HbA1C: No results for input(s): "HGBA1C" in the last 72 hours. CBG: No results for input(s): "GLUCAP" in the last 168 hours. Lipid Profile: No results for input(s): "CHOL", "HDL", "LDLCALC", "TRIG", "CHOLHDL", "LDLDIRECT" in the last 72 hours. Thyroid Function Tests: No results for input(s): "TSH", "T4TOTAL", "FREET4", "T3FREE", "THYROIDAB" in the last 72 hours. Anemia Panel: Recent Labs    11/24/22 0637  VITAMINB12 1,366*  FOLATE 6.8  FERRITIN 407*  TIBC 227*  IRON 18*  RETICCTPCT 0.4   Sepsis Labs: Recent Labs  Lab 11/22/22 1335 11/22/22 1504  LATICACIDVEN 1.8 0.9    Recent Results (from the past 240 hour(s))  Culture, blood (routine x 2)     Status: Abnormal (Preliminary result)   Collection Time: 11/22/22  1:08 PM   Specimen: BLOOD RIGHT FOREARM  Result Value Ref Range Status   Specimen Description   Final     BLOOD RIGHT FOREARM Performed at [facility], [address] [place], [place] [address]    Special Requests   Final    BOTTLES DRAWN AEROBIC AND ANAEROBIC Blood Culture adequate volume Performed at [facility], [address] [place], [place] [address]    Culture  Setup Time   Final    GRAM NEGATIVE RODS AEROBIC BOTTLE ONLY CRITICAL RESULT CALLED TO, READ BACK BY AND VERIFIED WITH: RN Rush Farmer ON [id number] @0723  BY SM    Culture (A)  Final    ESCHERICHIA COLI SUSCEPTIBILITIES TO FOLLOW Performed at [facility], [address], [place], [place] [address]    Report Status PENDING  Incomplete  Blood Culture ID Panel (Reflexed)     Status: Abnormal   Collection Time: 11/22/22  1:08 PM  Result Value Ref Range Status   Enterococcus faecalis NOT DETECTED NOT DETECTED Final   Enterococcus Faecium NOT DETECTED NOT DETECTED Final   Listeria monocytogenes NOT DETECTED NOT DETECTED Final   Staphylococcus species NOT DETECTED NOT DETECTED Final   Staphylococcus aureus (  BCID) NOT DETECTED NOT DETECTED Final   Staphylococcus epidermidis NOT DETECTED NOT DETECTED Final   Staphylococcus lugdunensis NOT DETECTED NOT DETECTED Final   Streptococcus species NOT DETECTED NOT DETECTED Final   Streptococcus agalactiae NOT DETECTED NOT DETECTED Final   Streptococcus pneumoniae NOT DETECTED NOT DETECTED Final   Streptococcus pyogenes NOT DETECTED NOT DETECTED Final   A.calcoaceticus-baumannii NOT DETECTED NOT DETECTED Final   Bacteroides fragilis NOT DETECTED NOT DETECTED Final   Enterobacterales DETECTED (A) NOT DETECTED Final    Comment: Enterobacterales represent a large order of gram negative bacteria, not a single organism. CRITICAL RESULT CALLED TO, READ BACK BY AND VERIFIED WITH: RN Rush Farmer ON [id number] @0723  BY SM    Enterobacter cloacae complex NOT DETECTED NOT DETECTED Final   Escherichia coli DETECTED (A) NOT DETECTED Final    Comment: CRITICAL RESULT CALLED  TO, READ BACK BY AND VERIFIED WITH: RN Rush Farmer ON [phone] @0723  BY SM    Klebsiella aerogenes NOT DETECTED NOT DETECTED Final   Klebsiella oxytoca NOT DETECTED NOT DETECTED Final   Klebsiella pneumoniae NOT DETECTED NOT DETECTED Final   Proteus species NOT DETECTED NOT DETECTED Final   Salmonella species NOT DETECTED NOT DETECTED Final   Serratia marcescens NOT DETECTED NOT DETECTED Final   Haemophilus influenzae NOT DETECTED NOT DETECTED Final   Neisseria meningitidis NOT DETECTED NOT DETECTED Final   Pseudomonas aeruginosa NOT DETECTED NOT DETECTED Final   Stenotrophomonas maltophilia NOT DETECTED NOT DETECTED Final   Candida albicans NOT DETECTED NOT DETECTED Final   Candida auris NOT DETECTED NOT DETECTED Final   Candida glabrata NOT DETECTED NOT DETECTED Final   Candida krusei NOT DETECTED NOT DETECTED Final   Candida parapsilosis NOT DETECTED NOT DETECTED Final   Candida tropicalis NOT DETECTED NOT DETECTED Final   Cryptococcus neoformans/gattii NOT DETECTED NOT DETECTED Final   CTX-M ESBL NOT DETECTED NOT DETECTED Final   Carbapenem resistance IMP NOT DETECTED NOT DETECTED Final   Carbapenem resistance KPC NOT DETECTED NOT DETECTED Final   Carbapenem resistance NDM NOT DETECTED NOT DETECTED Final   Carbapenem resist OXA 48 LIKE NOT DETECTED NOT DETECTED Final   Carbapenem resistance VIM NOT DETECTED NOT DETECTED Final    Comment: Performed at [facility], [address], [place], [place] [address]  Urine Culture     Status: None   Collection Time: 11/22/22  1:26 PM   Specimen: Urine, Random  Result Value Ref Range Status   Specimen Description   Final    URINE, RANDOM Performed at [facility], [address], [place], [place] [address]    Special Requests   Final    NONE Reflexed from [id number] Performed at [facility], [address] [place], [place] [address]    Culture   Final    NO GROWTH Performed at [facility], [address], [place], [place] [address]    Report Status 11/24/2022 FINAL  Final  SARS Coronavirus 2 by RT PCR (hospital order, performed in [facility] hospital lab) *cepheid single result test* Anterior Nasal Swab     Status: None   Collection Time: 11/22/22  1:40 PM   Specimen: Anterior Nasal Swab  Result Value Ref Range Status   SARS Coronavirus 2 by RT PCR NEGATIVE NEGATIVE Final    Comment: (NOTE) SARS-CoV-2 target nucleic acids are NOT DETECTED.  The SARS-CoV-2 RNA is generally detectable in upper and lower respiratory specimens during the acute phase of  infection. The lowest concentration of SARS-CoV-2 viral copies this assay can detect is 250 copies / mL. A negative result does not preclude SARS-CoV-2 infection and should not be used as the sole basis for treatment or other patient management decisions.  A negative result may occur with improper specimen collection / handling, submission of specimen other than nasopharyngeal swab, presence of viral mutation(s) within the areas targeted by this assay, and inadequate number of viral copies (<250 copies / mL). A negative result must be combined with clinical observations, patient history, and epidemiological information.  Fact Sheet for Patients:   RoadLapTop.co.za  Fact Sheet for Healthcare Providers: http://kim-miller.com/  This test is not yet approved or  cleared by the [place] FDA and has been authorized for detection and/or diagnosis of SARS-CoV-2 by FDA under an Emergency Use Authorization (EUA).  This EUA will remain in effect (meaning this test can be used) for the duration of the COVID-19 declaration under Section 564(b)(1) of the Act, 21 U.S.C. section 360bbb-3(b)(1), unless the authorization is terminated or revoked sooner.  Performed at [facility], [address] [place], [place] [address]          Radiology  Studies: ECHOCARDIOGRAM COMPLETE  Result Date: 11/23/2022    ECHOCARDIOGRAM REPORT   Patient Name:   [facility]  Date of Exam: 11/23/2022 Medical Rec #:  [id number]  Height:       63.0 in Accession #:    [id number] Weight:       121.3 lb Date of Birth:  October 21, 1950  BSA:          1.563 m Patient Age:    71 years   BP:           157/69 mmHg Patient Gender: M          HR:           82 bpm. Exam Location:  Inpatient Procedure: 2D Echo, Color Doppler and Cardiac Doppler Indications:    Bacteremia  History:        Patient has no prior history of Echocardiogram examinations.                 Risk Factors:Hypertension and Dyslipidemia.  Sonographer:    Irving Burton Senior RDCS Referring Phys: [phone] DAVID MANUEL ORTIZ  Sonographer Comments: Suboptimal apical window due to lung interference. IMPRESSIONS  1. Left ventricular ejection fraction, by estimation, is 55 to 60%. The left ventricle has normal function. The left ventricle has no regional wall motion abnormalities. Left ventricular diastolic parameters were normal.  2. Right ventricular systolic function is normal. The right ventricular size is normal. There is mildly elevated pulmonary artery systolic pressure. The estimated right ventricular systolic pressure is 38.7 mmHg.  3. Left atrial size was mildly dilated.  4. The mitral valve is normal in structure. Mild mitral valve regurgitation. No evidence of mitral stenosis.  5. The aortic valve is tricuspid. There is mild thickening of the aortic valve. Aortic valve regurgitation is moderate. No aortic stenosis is present.  6. 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  7. The inferior vena cava is normal in size with <50% respiratory variability, suggesting right atrial pressure of 8 mmHg. Comparison(s): Regurgitant valve lesions raise concern for endocarditis in setting of bacteremia, consider TEE if clinically indicated. FINDINGS  Left Ventricle: Left ventricular ejection fraction, by estimation, is 55 to 60%. The left  ventricle has normal function. The left ventricle has no regional wall motion abnormalities. The left ventricular internal cavity  size was normal in size. There is  borderline left ventricular hypertrophy. Left ventricular diastolic parameters were normal. Right Ventricle: The right ventricular size is normal. No increase in right ventricular wall thickness. Right ventricular systolic function is normal. There is mildly elevated pulmonary artery systolic pressure. The tricuspid regurgitant velocity is 2.77  m/s, and with an assumed right atrial pressure of 8 mmHg, the estimated right ventricular systolic pressure is 38.7 mmHg. Left Atrium: Left atrial size was mildly dilated. Right Atrium: Right atrial size was normal in size. Pericardium: There is no evidence of pericardial effusion. Mitral Valve: The mitral valve is normal in structure. Mild mitral valve regurgitation. No evidence of mitral valve stenosis. Tricuspid Valve: The tricuspid valve is normal in structure. Tricuspid valve regurgitation is mild . No evidence of tricuspid stenosis. Aortic Valve: The aortic valve is tricuspid. There is mild thickening of the aortic valve. Aortic valve regurgitation is moderate. Aortic regurgitation PHT measures 416 msec. No aortic stenosis is present. Pulmonic Valve: The pulmonic valve was not well visualized. 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 No evidence of pulmonic stenosis. Aorta: The aortic root and ascending aorta are structurally normal, with no evidence of dilitation. Venous: The inferior vena cava is normal in size with less than 50% respiratory variability, suggesting right atrial pressure of 8 mmHg. IAS/Shunts: The atrial septum is grossly normal.  LEFT VENTRICLE PLAX 2D LVIDd:         4.90 cm   Diastology LVIDs:         3.20 cm   LV e' medial:    10.40 cm/s LV PW:         1.00 cm   LV E/e' medial:  8.0 LV IVS:        1.00 cm   LV e' lateral:   11.70 cm/s LVOT diam:     2.10 cm   LV E/e' lateral: 7.1 LV SV:          73 LV SV Index:   47 LVOT Area:     3.46 cm  RIGHT VENTRICLE RV S prime:     9.90 cm/s TAPSE (M-mode): 2.1 cm LEFT ATRIUM             Index        RIGHT ATRIUM           Index LA diam:        3.70 cm 2.37 cm/m   RA Area:     16.10 cm LA Vol (A2C):   57.8 ml 36.98 ml/m  RA Volume:   35.40 ml  22.65 ml/m LA Vol (A4C):   48.4 ml 30.96 ml/m LA Biplane Vol: 53.9 ml 34.48 ml/m  AORTIC VALVE LVOT Vmax:   97.30 cm/s LVOT Vmean:  72.600 cm/s LVOT VTI:    0.212 m AI PHT:      416 msec  AORTA Ao Root diam: 3.60 cm Ao Asc diam:  3.60 cm MITRAL VALVE               TRICUSPID VALVE MV Area (PHT): 3.42 cm    TR Peak grad:   30.7 mmHg MV Decel Time: 222 msec    TR Vmax:        277.00 cm/s MV E velocity: 83.60 cm/s MV A velocity: 78.00 cm/s  SHUNTS MV E/A ratio:  1.07        Systemic VTI:  0.21 m  Systemic Diam: 2.10 cm Weston Brass MD Electronically signed by Weston Brass MD Signature Date/Time: 11/23/2022/5:24:19 PM    Final    CT ABDOMEN PELVIS W CONTRAST  Result Date: 11/23/2022 CLINICAL DATA:  Suspected pulmonary embolism. EXAM: CT ANGIOGRAPHY CHEST CT ABDOMEN AND PELVIS WITH CONTRAST TECHNIQUE: Multidetector CT imaging of the chest was performed using the standard protocol during bolus administration of intravenous contrast. Multiplanar CT image reconstructions and MIPs were obtained to evaluate the vascular anatomy. Multidetector CT imaging of the abdomen and pelvis was performed using the standard protocol during bolus administration of intravenous contrast. RADIATION DOSE REDUCTION: This exam was performed according to the departmental dose-optimization program which includes automated exposure control, adjustment of the mA and/or kV according to patient size and/or use of iterative reconstruction technique. CONTRAST:  80mL OMNIPAQUE IOHEXOL 350 MG/ML SOLN COMPARISON:  November 09, 2014. FINDINGS: CTA CHEST FINDINGS Cardiovascular: Satisfactory opacification of the pulmonary  arteries to the segmental level. No evidence of pulmonary embolism. Normal heart size. No pericardial effusion. Coronary artery calcifications are noted. Mediastinum/Nodes: No enlarged mediastinal, hilar, or axillary lymph nodes. Thyroid gland, trachea, and esophagus demonstrate no significant findings. Lungs/Pleura: No pneumothorax or pleural effusion is noted. Mild emphysematous disease is noted. Minimal right posterior basilar subsegmental atelectasis is noted. Musculoskeletal: No chest wall abnormality. No acute or significant osseous findings. Review of the MIP images confirms the above findings. CT ABDOMEN and PELVIS FINDINGS Hepatobiliary: No focal liver abnormality is seen. No gallstones, gallbladder wall thickening, or biliary dilatation. Pancreas: Unremarkable. No pancreatic ductal dilatation or surrounding inflammatory changes. Spleen: Normal in size without focal abnormality. Adrenals/Urinary Tract: Adrenal glands appear normal. Left renal cysts are noted for which no further follow-up is required. No hydronephrosis or renal obstruction is noted. Mild urinary bladder distention is noted. Stomach/Bowel: Stomach is within normal limits. Appendix appears normal. No evidence of bowel wall thickening, distention, or inflammatory changes. Vascular/Lymphatic: Aortic atherosclerosis. No enlarged abdominal or pelvic lymph nodes. Reproductive: Prostate is unremarkable. Other: No abdominal wall hernia or abnormality. No abdominopelvic ascites. Musculoskeletal: No acute or significant osseous findings. Review of the MIP images confirms the above findings. IMPRESSION: No definite evidence of pulmonary embolus. Mild urinary bladder distention is noted. Coronary artery calcifications are noted. Aortic Atherosclerosis (ICD10-I70.0) and Emphysema (ICD10-J43.9). Electronically Signed   By: Lupita Raider M.D.   On: 11/23/2022 15:47   CT Angio Chest Pulmonary Embolism (PE) W or WO Contrast  Result Date:  11/23/2022 CLINICAL DATA:  Suspected pulmonary embolism. EXAM: CT ANGIOGRAPHY CHEST CT ABDOMEN AND PELVIS WITH CONTRAST TECHNIQUE: Multidetector CT imaging of the chest was performed using the standard protocol during bolus administration of intravenous contrast. Multiplanar CT image reconstructions and MIPs were obtained to evaluate the vascular anatomy. Multidetector CT imaging of the abdomen and pelvis was performed using the standard protocol during bolus administration of intravenous contrast. RADIATION DOSE REDUCTION: This exam was performed according to the departmental dose-optimization program which includes automated exposure control, adjustment of the mA and/or kV according to patient size and/or use of iterative reconstruction technique. CONTRAST:  80mL OMNIPAQUE IOHEXOL 350 MG/ML SOLN COMPARISON:  November 09, 2014. FINDINGS: CTA CHEST FINDINGS Cardiovascular: Satisfactory opacification of the pulmonary arteries to the segmental level. No evidence of pulmonary embolism. Normal heart size. No pericardial effusion. Coronary artery calcifications are noted. Mediastinum/Nodes: No enlarged mediastinal, hilar, or axillary lymph nodes. Thyroid gland, trachea, and esophagus demonstrate no significant findings. Lungs/Pleura: No pneumothorax or pleural effusion is noted. Mild emphysematous disease is noted. Minimal  right posterior basilar subsegmental atelectasis is noted. Musculoskeletal: No chest wall abnormality. No acute or significant osseous findings. Review of the MIP images confirms the above findings. CT ABDOMEN and PELVIS FINDINGS Hepatobiliary: No focal liver abnormality is seen. No gallstones, gallbladder wall thickening, or biliary dilatation. Pancreas: Unremarkable. No pancreatic ductal dilatation or surrounding inflammatory changes. Spleen: Normal in size without focal abnormality. Adrenals/Urinary Tract: Adrenal glands appear normal. Left renal cysts are noted for which no further follow-up is  required. No hydronephrosis or renal obstruction is noted. Mild urinary bladder distention is noted. Stomach/Bowel: Stomach is within normal limits. Appendix appears normal. No evidence of bowel wall thickening, distention, or inflammatory changes. Vascular/Lymphatic: Aortic atherosclerosis. No enlarged abdominal or pelvic lymph nodes. Reproductive: Prostate is unremarkable. Other: No abdominal wall hernia or abnormality. No abdominopelvic ascites. Musculoskeletal: No acute or significant osseous findings. Review of the MIP images confirms the above findings. IMPRESSION: No definite evidence of pulmonary embolus. Mild urinary bladder distention is noted. Coronary artery calcifications are noted. Aortic Atherosclerosis (ICD10-I70.0) and Emphysema (ICD10-J43.9). Electronically Signed   By: Lupita Raider M.D.   On: 11/23/2022 15:47   CT Head Wo Contrast  Result Date: 11/22/2022 CLINICAL DATA:  falls; Neck pain, chronic falls, neck pain. EXAM: CT HEAD WITHOUT CONTRAST CT CERVICAL SPINE WITHOUT CONTRAST TECHNIQUE: Multidetector CT imaging of the head and cervical spine was performed following the standard protocol without intravenous contrast. Multiplanar CT image reconstructions of the cervical spine were also generated. RADIATION DOSE REDUCTION: This exam was performed according to the departmental dose-optimization program which includes automated exposure control, adjustment of the mA and/or kV according to patient size and/or use of iterative reconstruction technique. COMPARISON:  None Available. FINDINGS: CT HEAD FINDINGS Brain: No acute intracranial hemorrhage. Gray-white differentiation is preserved. No hydrocephalus or extra-axial collection. No mass effect or midline shift. Vascular: No hyperdense vessel or unexpected calcification. Skull: No calvarial fracture or suspicious bone lesion. Skull base is unremarkable. Sinuses/Orbits: No acute finding. Other: None. CT CERVICAL SPINE FINDINGS Alignment: 3 mm  degenerative anterolisthesis of C4 on C5 with focal kyphosis at this level. No traumatic malalignment. Skull base and vertebrae: No acute fracture. Normal craniocervical junction. No suspicious bone lesions. Soft tissues and spinal canal: No prevertebral fluid or swelling. No visible canal hematoma. Disc levels: Multilevel cervical spondylosis, worst at C5-6, where there is mild spinal canal stenosis. Upper chest: Emphysema and scarring in the lung apices. Other: Atherosclerotic calcifications of the carotid bulbs. IMPRESSION: 1. No acute intracranial abnormality. 2. No acute cervical spine fracture or traumatic malalignment. 3. Multilevel cervical spondylosis, worst at C5-6, where there is mild spinal canal stenosis. Electronically Signed   By: Orvan Falconer M.D.   On: 11/22/2022 15:06   CT Cervical Spine Wo Contrast  Result Date: 11/22/2022 CLINICAL DATA:  falls; Neck pain, chronic falls, neck pain. EXAM: CT HEAD WITHOUT CONTRAST CT CERVICAL SPINE WITHOUT CONTRAST TECHNIQUE: Multidetector CT imaging of the head and cervical spine was performed following the standard protocol without intravenous contrast. Multiplanar CT image reconstructions of the cervical spine were also generated. RADIATION DOSE REDUCTION: This exam was performed according to the departmental dose-optimization program which includes automated exposure control, adjustment of the mA and/or kV according to patient size and/or use of iterative reconstruction technique. COMPARISON:  None Available. FINDINGS: CT HEAD FINDINGS Brain: No acute intracranial hemorrhage. Gray-white differentiation is preserved. No hydrocephalus or extra-axial collection. No mass effect or midline shift. Vascular: No hyperdense vessel or unexpected calcification. Skull: No calvarial fracture  or suspicious bone lesion. Skull base is unremarkable. Sinuses/Orbits: No acute finding. Other: None. CT CERVICAL SPINE FINDINGS Alignment: 3 mm degenerative anterolisthesis of C4  on C5 with focal kyphosis at this level. No traumatic malalignment. Skull base and vertebrae: No acute fracture. Normal craniocervical junction. No suspicious bone lesions. Soft tissues and spinal canal: No prevertebral fluid or swelling. No visible canal hematoma. Disc levels: Multilevel cervical spondylosis, worst at C5-6, where there is mild spinal canal stenosis. Upper chest: Emphysema and scarring in the lung apices. Other: Atherosclerotic calcifications of the carotid bulbs. IMPRESSION: 1. No acute intracranial abnormality. 2. No acute cervical spine fracture or traumatic malalignment. 3. Multilevel cervical spondylosis, worst at C5-6, where there is mild spinal canal stenosis. Electronically Signed   By: Orvan Falconer M.D.   On: 11/22/2022 15:06   DG Chest Port 1 View  Result Date: 11/22/2022 CLINICAL DATA:  Fever. EXAM: PORTABLE CHEST 1 VIEW COMPARISON:  None Available. FINDINGS: Mild cardiomegaly. Minimal bilateral midlung opacities are noted suggesting focal atelectasis or less likely infiltrates. Bony thorax is unremarkable. IMPRESSION: Minimal bilateral midlung opacities are noted suggesting focal atelectasis or less likely infiltrates. Electronically Signed   By: Lupita Raider M.D.   On: 11/22/2022 14:13   DG Shoulder Right  Result Date: 11/22/2022 CLINICAL DATA:  Pain.  Fall. EXAM: RIGHT SHOULDER - 2+ VIEW COMPARISON:  None Available. FINDINGS: Three views of the right shoulder. Mild acromioclavicular joint space narrowing and peripheral osteophytosis. The glenohumeral joint space is maintained. No acute fracture or dislocation. Mild-to-moderate degenerative disc changes are noted within the thoracic spine. IMPRESSION: Mild acromioclavicular osteoarthritis. Electronically Signed   By: Neita Garnet M.D.   On: 11/22/2022 13:43   DG Shoulder Left  Result Date: 11/22/2022 CLINICAL DATA:  Pain.  Fall. EXAM: LEFT SHOULDER - 2+ VIEW COMPARISON:  None Available. FINDINGS: Frontal and  transscapular Y-views of the left shoulder. Mild glenohumeral joint space narrowing. Minimal inferior glenoid degenerative osteophytosis. Mild acromioclavicular subchondral cystic change and peripheral osteophytosis. No acute fracture is seen. No dislocation. Mild-to-moderate atherosclerotic calcifications within the aortic arch. IMPRESSION: Mild glenohumeral and acromioclavicular osteoarthritis. Electronically Signed   By: Neita Garnet M.D.   On: 11/22/2022 13:42        Scheduled Meds:  aspirin EC  81 mg Oral Daily   enoxaparin (LOVENOX) injection  40 mg Subcutaneous Q24H   lidocaine  1 patch Transdermal Q24H   losartan  50 mg Oral Daily   Continuous Infusions:  sodium chloride     cefTRIAXone (ROCEPHIN)  IV 2 g (11/24/22 1006)          Glade Lloyd, MD [facility] 11/24/2022, 11:32 AM

## 2022-11-24 NOTE — Progress Notes (Signed)
Id to formally tomorrow   Ecoli bacteremia Neck pain/shoulder pain   Cervical spine mri epidural abscess    -agree with primary team to add vanc and consult nsg/ir for cervical abscess. While ecoli in blood is suggestive of cervical spine being same process, only staph aureus in blood is definitive -continue ceftriaxone

## 2022-11-24 NOTE — Progress Notes (Signed)
Pharmacy Antibiotic Note  Frederick Andrews is a 72 y.o. male admitted on 11/23/2022 who was seen in the ED on 7/26 with shoulder pain s/p fall. BCx obtained in ED later became positive for E.coli > pt was called and instructed to return to hospital for IV antibiotics and workup. Pt started on ceftriaxone. 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 ID consulted, initiating vancomycin in addition to ceftriaxone.   Pharmacy has been consulted for vancomycin dosing.  Today, 11/24/22 -WBC WNL -Afebrile  Plan: Continue ceftriaxone 2 g IV q24h Vancomycin 1250 mg loading dose followed by 1000 mg IV q24h for estimated AUC of 538. Goal 400-550.  Monitor renal function, culture data. Check vancomycin level at steady state as needed.   Height: 5\' 3"  (160 cm) Weight: 55 kg (121 lb 4.1 oz) IBW/kg (Calculated) : 56.9  Temp (24hrs), Avg:98.4 F (36.9 C), Min:97.8 F (36.6 C), Max:99.1 F (37.3 C)  Recent Labs  Lab 11/22/22 1312 11/22/22 1335 11/22/22 1504 11/23/22 1400 11/24/22 0637  WBC 9.6  --   --  8.7 9.4  CREATININE 1.13  --   --  1.03 0.95  LATICACIDVEN  --  1.8 0.9  --   --     Estimated Creatinine Clearance: 53.9 mL/min (by C-G formula based on SCr of 0.95 mg/dL).    No Known Allergies  Antimicrobials this admission: ceftriaxone 7/28 >>  vancomycin 7/28 >>   Dose adjustments this admission:  Microbiology results: 7/28 repeat BCx:  7/26 BCx: E.coli  Cindi Carbon, PharmD 11/24/2022 3:00 PM

## 2022-11-25 ENCOUNTER — Other Ambulatory Visit: Payer: Self-pay

## 2022-11-25 ENCOUNTER — Encounter (HOSPITAL_COMMUNITY)
Admission: EM | Disposition: A | Discharge status: Home Health | Payer: Self-pay | Source: Home / Self Care | Attending: Internal Medicine

## 2022-11-25 ENCOUNTER — Other Ambulatory Visit: Payer: Self-pay | Admitting: Neurological Surgery

## 2022-11-25 DIAGNOSIS — M00811 Arthritis due to other bacteria, right shoulder: Secondary | ICD-10-CM

## 2022-11-25 DIAGNOSIS — L0211 Cutaneous abscess of neck: Secondary | ICD-10-CM

## 2022-11-25 DIAGNOSIS — R7881 Bacteremia: Secondary | ICD-10-CM | POA: Diagnosis not present

## 2022-11-25 DIAGNOSIS — B9620 Unspecified Escherichia coli [E. coli] as the cause of diseases classified elsewhere: Secondary | ICD-10-CM | POA: Diagnosis not present

## 2022-11-25 DIAGNOSIS — M00812 Arthritis due to other bacteria, left shoulder: Secondary | ICD-10-CM | POA: Diagnosis not present

## 2022-11-25 DIAGNOSIS — G062 Extradural and subdural abscess, unspecified: Secondary | ICD-10-CM | POA: Diagnosis present

## 2022-11-25 DIAGNOSIS — Z9181 History of falling: Secondary | ICD-10-CM

## 2022-11-25 HISTORY — PX: ANTERIOR CERVICAL DECOMPRESSION FOR EPIDURAL ABSCESS: SHX5719

## 2022-11-25 LAB — SURGICAL PCR SCREEN
MRSA, PCR: NEGATIVE
Staphylococcus aureus: POSITIVE — AB

## 2022-11-25 SURGERY — ANTERIOR CERVICAL DECOMPRESSION FOR EPIDURAL ABSCESS
Anesthesia: General | Site: Neck

## 2022-11-25 MED ORDER — FENTANYL CITRATE PF 50 MCG/ML IJ SOSY: 25.0000 ug | PREFILLED_SYRINGE | INTRAMUSCULAR | Status: DC | PRN

## 2022-11-25 MED ORDER — POTASSIUM CHLORIDE CRYS ER 20 MEQ PO TBCR
40.0000 meq | EXTENDED_RELEASE_TABLET | Freq: Once | ORAL | Status: AC
  Administered 2022-11-25: 40 meq via ORAL
  Filled 2022-11-25: qty 2

## 2022-11-25 MED ORDER — MIDAZOLAM HCL 2 MG/2ML IJ SOLN
INTRAMUSCULAR | Status: AC
  Filled 2022-11-25: qty 2

## 2022-11-25 MED ORDER — BUPIVACAINE HCL (PF) 0.5 % IJ SOLN
INTRAMUSCULAR | Status: AC
  Filled 2022-11-25: qty 30

## 2022-11-25 MED ORDER — KCL IN DEXTROSE-NACL 40-5-0.9 MEQ/L-%-% IV SOLN
INTRAVENOUS | Status: DC
  Filled 2022-11-25 (×2): qty 1000

## 2022-11-25 MED ORDER — LIDOCAINE-EPINEPHRINE 1 %-1:100000 IJ SOLN
INTRAMUSCULAR | Status: AC
  Filled 2022-11-25: qty 1

## 2022-11-25 MED ORDER — FENTANYL CITRATE (PF) 250 MCG/5ML IJ SOLN
INTRAMUSCULAR | Status: AC
  Filled 2022-11-25: qty 5

## 2022-11-25 MED ORDER — THROMBIN 5000 UNITS EX SOLR
CUTANEOUS | Status: AC
  Filled 2022-11-25: qty 5000

## 2022-11-25 SURGICAL SUPPLY — 60 items
ADH SKN CLS APL DERMABOND .7 (GAUZE/BANDAGES/DRESSINGS) ×1
ADH SKN CLS LQ APL DERMABOND (GAUZE/BANDAGES/DRESSINGS) ×1
BAG COUNTER SPONGE SURGICOUNT (BAG) ×1 IMPLANT
BAG SPNG CNTER NS LX DISP (BAG) ×1
BASKET BONE COLLECTION (BASKET) IMPLANT
BIT DRILL ACP 15 (DRILL) IMPLANT
BIT DRILL NEURO 2X3.1 SFT TUCH (MISCELLANEOUS) ×1 IMPLANT
BNDG GAUZE DERMACEA FLUFF 4 (GAUZE/BANDAGES/DRESSINGS) ×2 IMPLANT
BNDG GZE DERMACEA 4 6PLY (GAUZE/BANDAGES/DRESSINGS) ×2
BUR BARREL STRAIGHT FLUTE 4.0 (BURR) IMPLANT
CAGE CERV MOD 6X15X12 7D (Cage) IMPLANT
CANISTER SUCT 3000ML PPV (MISCELLANEOUS) ×1 IMPLANT
CNTNR URN SCR LID CUP LEK RST (MISCELLANEOUS) IMPLANT
CONT SPEC 4OZ STRL OR WHT (MISCELLANEOUS) ×1
DERMABOND ADVANCED .7 DNX12 (GAUZE/BANDAGES/DRESSINGS) ×1 IMPLANT
DERMABOND ADVANCED .7 DNX6 (GAUZE/BANDAGES/DRESSINGS) IMPLANT
DEVICE DISSECT PLASMABLAD 3.0S (MISCELLANEOUS) IMPLANT
DRAPE LAPAROTOMY 100X72 PEDS (DRAPES) ×1 IMPLANT
DRAPE MICROSCOPE SLANT 54X150 (MISCELLANEOUS) IMPLANT
DRILL ACP 15 (DRILL) ×1
DRILL NEURO 2X3.1 SOFT TOUCH (MISCELLANEOUS) ×1
DURAPREP 6ML APPLICATOR 50/CS (WOUND CARE) ×1 IMPLANT
ELECT COATED BLADE 2.86 ST (ELECTRODE) ×1 IMPLANT
ELECT REM PT RETURN 9FT ADLT (ELECTROSURGICAL) ×1
ELECTRODE REM PT RTRN 9FT ADLT (ELECTROSURGICAL) ×1 IMPLANT
GAUZE 4X4 16PLY ~~LOC~~+RFID DBL (SPONGE) IMPLANT
GLOVE BIO SURGEON STRL SZ7 (GLOVE) IMPLANT
GLOVE BIOGEL PI IND STRL 8.5 (GLOVE) ×1 IMPLANT
GLOVE ECLIPSE 8.5 STRL (GLOVE) ×1 IMPLANT
GLOVE EXAM NITRILE XL STR (GLOVE) IMPLANT
GLOVE INDICATOR 7.5 STRL GRN (GLOVE) IMPLANT
GOWN STRL REUS W/ TWL LRG LVL3 (GOWN DISPOSABLE) IMPLANT
GOWN STRL REUS W/ TWL XL LVL3 (GOWN DISPOSABLE) ×1 IMPLANT
GOWN STRL REUS W/TWL 2XL LVL3 (GOWN DISPOSABLE) ×1 IMPLANT
GOWN STRL REUS W/TWL LRG LVL3 (GOWN DISPOSABLE) ×1
GOWN STRL REUS W/TWL XL LVL3 (GOWN DISPOSABLE) ×1
HALTER HD/CHIN CERV TRACTION D (MISCELLANEOUS) ×1 IMPLANT
HEMOSTAT POWDER KIT SURGIFOAM (HEMOSTASIS) ×1 IMPLANT
KIT BASIN OR (CUSTOM PROCEDURE TRAY) ×1 IMPLANT
KIT TURNOVER KIT B (KITS) ×1 IMPLANT
NDL HYPO 22X1.5 SAFETY MO (MISCELLANEOUS) ×1 IMPLANT
NDL SPNL 22GX3.5 QUINCKE BK (NEEDLE) ×1 IMPLANT
NEEDLE HYPO 22X1.5 SAFETY MO (MISCELLANEOUS) ×1 IMPLANT
NEEDLE SPNL 22GX3.5 QUINCKE BK (NEEDLE) ×1 IMPLANT
NS IRRIG 1000ML POUR BTL (IV SOLUTION) ×1 IMPLANT
PACK LAMINECTOMY NEURO (CUSTOM PROCEDURE TRAY) ×1 IMPLANT
PAD ARMBOARD 7.5X6 YLW CONV (MISCELLANEOUS) ×3 IMPLANT
PATTIES SURGICAL .5 X1 (DISPOSABLE) ×1 IMPLANT
PLASMABLADE 3.0S (MISCELLANEOUS) ×1
PLATE ACP 1.6X36 2LVL (Plate) IMPLANT
PUTTY DBM PROPEL SM (Putty) IMPLANT
SCREW ACP VA ST 3.5X15 (Screw) IMPLANT
SET WALTER ACTIVATION W/DRAPE (SET/KITS/TRAYS/PACK) ×1 IMPLANT
SPIKE FLUID TRANSFER (MISCELLANEOUS) ×1 IMPLANT
SPONGE INTESTINAL PEANUT (DISPOSABLE) ×1 IMPLANT
SUT VIC AB 4-0 RB1 18 (SUTURE) ×2 IMPLANT
TOWEL GREEN STERILE (TOWEL DISPOSABLE) ×1 IMPLANT
TOWEL GREEN STERILE FF (TOWEL DISPOSABLE) ×1 IMPLANT
TUBING FEATHERFLOW (TUBING) ×1 IMPLANT
WATER STERILE IRR 1000ML POUR (IV SOLUTION) ×1 IMPLANT

## 2022-11-25 NOTE — Progress Notes (Signed)
Mobility Specialist - Progress Note   11/25/22 1008  Mobility  Activity Ambulated with assistance in hallway  Level of Assistance Modified independent, requires aide device or extra time  Assistive Device Other (Comment) (IV Pole)  Distance Ambulated (ft) 275 ft  Activity Response Tolerated well  Mobility Referral Yes  $Mobility charge 1 Mobility  Mobility Specialist Start Time (ACUTE ONLY) 0957  Mobility Specialist Stop Time (ACUTE ONLY) 1008  Mobility Specialist Time Calculation (min) (ACUTE ONLY) 11 min   Pt received in bed and agreeable to mobility. No complaints during session. Pt to bed after session with all needs met & nurse in room.  [facility]

## 2022-11-25 NOTE — Progress Notes (Signed)
Patient ID: Frederick Andrews, male   DOB: 19-May-1950, 72 y.o.   MRN: [id number] 24 hours after requesting an urgent transfer of the patient from [facility] to [facility] he has finally arrived at [facility].  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  The patient is moving all 4 extremities however he is weak in his upper extremities on physical exam.  The patient should have a urgent decompression of this process which I have posted for the operating room this evening but I am told he will not likely see the operating room for another 6 to 8 hours as there are cases posted ahead of him.  We will schedule him for surgery.

## 2022-11-25 NOTE — Progress Notes (Signed)
PROGRESS NOTE    Frederick Armentrout  UVO:[id number] DOB: [birth year] DOA: 11/23/2022 PCP: Patient, No Pcp Per   Brief Narrative:    72 year old male with history of hypertension, hyperlipidemia, continued tobacco use presented to the emergency department after having a fall with left shoulder pain.  Patient was found to have low-grade fevers, obtain blood cultures which came back to be positive for E coli.  Urinalysis is positive for urinary tract infection.  Patient was also complaining of severe neck and bilateral shoulder pain.  Obtain MRI of the neck and shoulder showed C4-C5 diskitis/osteomyelitis with C5 epidural abscess.  MRI of the shoulders could note rule out septic arthritis.  Neurosurgery is consulted.  Concerning about patient's positive blood cultures with E coli patient was on Rocephin also added vancomycin.  Id is consulted.  2D echocardiogram showed valvular regurgitation.  Cardiology is consulted for transesophageal echocardiogram.  Severe sepsis secondary to E coli bacteremia -blood cultures are growing E coli, sensitivities are pending -repeat blood cultures concerning about the diskitis -echocardiogram showed EF of 55-60% with mild mitral valve regurgitation, moderate aortic regurgitation, severe pulmonary valve regurgitation. -repeat blood cultures - continue with Rocephin, vancomycin.    C5-C6 diskitis with abscess -neurosurgery is consulted  -continue with neuro checks -continue with the vancomycin, add meropenem while waiting for the  sensitivities.    Bilateral shoulder pain - consulted orthopedic surgery  - feeling more arthritis with possible severe rotator cuff care, unlikely septic arthritis  Hypertension accelerated - currently on hydralazine, losartan.   - add amlodipine  Urinary tract infection -follow up with urine cultures - continue with current antibiotic regimen.    Hypoglycemia -secondary to decreased p.o. intake  Thrombocytopenia  - secondary to  sepsis  Thrombocytopenia -Questionable cause.  Monitor intermittently.  No signs of bleeding.  DVT prophylaxis: Lovenox Code Status: Full Family Communication: Daughter and son at bedside Disposition Plan: Status is: Inpatient Remains inpatient appropriate because: Of severity of illness    Consultants: None  Procedures: 2D echo  Antimicrobials: Rocephin from 11/23/2022 onwards   Subjective: Patient seen and examined at bedside.  Does not speak English much: Daughter at bedside translated for the patient patient complains of severe neck and bilateral shoulder pain.  No fever or vomiting reported.  Objective: Vitals:   11/24/22 0424 11/24/22 1357 11/24/22 2054 11/25/22 0424  BP: (!) 186/79 (!) 177/76 (!) 144/65 (!) 154/65  Pulse: 90 78 71 65  Resp: 20 17 20 18   Temp: 98.6 F (37 C) 98.2 F (36.8 C) 98.4 F (36.9 C) 97.7 F (36.5 C)  TempSrc: Oral  Oral Oral  SpO2: 98% 95% 97% 94%  Weight:      Height:        Intake/Output Summary (Last 24 hours) at 11/25/2022 0834 Last data filed at 11/25/2022 0309 Gross per 24 hour  Intake 1537.75 ml  Output --  Net 1537.75 ml   Filed Weights   11/23/22 0910  Weight: 55 kg    Examination:  General exam: Appears calm and comfortable. : Deconditioned.  On room air. Respiratory system: Bilateral decreased breath sounds at bases Cardiovascular system: S1 & S2 heard, Rate controlled Gastrointestinal system: Abdomen is nondistended, soft and nontender. Normal bowel sounds heard. Extremities: No cyanosis, clubbing, edema  Central nervous system: Awake.  No focal neurological deficits. Moving extremities Skin: No rashes, lesions or ulcers Psychiatry: Flat affect.  Not agitated. Musculoskeletal: Neck and right shoulder tenderness and decreased range of motion present.  Data Reviewed: I have  personally reviewed following labs and imaging studies  CBC: Recent Labs  Lab 11/22/22 1312 11/23/22 1400 11/24/22 0637 11/25/22 0530   WBC 9.6 8.7 9.4 9.3  NEUTROABS 7.9* 6.7  --  6.4  HGB 13.8 12.8* 11.4* 11.0*  HCT 41.8 38.6* 34.8* 32.2*  MCV 86.7 87.1 87.7 84.1  PLT 122* 122* 122* 145*   Basic Metabolic Panel: Recent Labs  Lab 11/22/22 1312 11/23/22 1400 11/24/22 0637 11/25/22 0530  NA 135 134* 137 136  K 4.1 4.0 3.8 3.1*  CL 102 104 109 104  CO2 17* 16* 14* 24  GLUCOSE 103* 71 69* 145*  BUN 36* 41* 31* 22  CREATININE 1.13 1.03 0.95 0.76  CALCIUM 9.0 8.0* 7.8* 7.5*  MG  --  2.3  --  2.2  PHOS  --  4.1  --   --    GFR: Estimated Creatinine Clearance: 64 mL/min (by C-G formula based on SCr of 0.76 mg/dL). Liver Function Tests: Recent Labs  Lab 11/22/22 1312 11/23/22 1400 11/24/22 0637 11/25/22 0530  AST 28 25 27 26   ALT 32 29 28 28   ALKPHOS 162* 157* 184* 238*  BILITOT 1.8* 1.5* 1.6* 1.0  PROT 8.1 6.6 6.0* 5.6*  ALBUMIN 3.3* 2.6* 2.4* 2.2*   No results for input(s): "LIPASE", "AMYLASE" in the last 168 hours. No results for input(s): "AMMONIA" in the last 168 hours. Coagulation Profile: Recent Labs  Lab 11/23/22 1400  INR 1.0   Cardiac Enzymes: No results for input(s): "CKTOTAL", "CKMB", "CKMBINDEX", "TROPONINI" in the last 168 hours. BNP (last 3 results) No results for input(s): "PROBNP" in the last 8760 hours. HbA1C: No results for input(s): "HGBA1C" in the last 72 hours. CBG: No results for input(s): "GLUCAP" in the last 168 hours. Lipid Profile: No results for input(s): "CHOL", "HDL", "LDLCALC", "TRIG", "CHOLHDL", "LDLDIRECT" in the last 72 hours. Thyroid Function Tests: No results for input(s): "TSH", "T4TOTAL", "FREET4", "T3FREE", "THYROIDAB" in the last 72 hours. Anemia Panel: Recent Labs    11/24/22 0637  VITAMINB12 1,366*  FOLATE 6.8  FERRITIN 407*  TIBC 227*  IRON 18*  RETICCTPCT 0.4   Sepsis Labs: Recent Labs  Lab 11/22/22 1335 11/22/22 1504  LATICACIDVEN 1.8 0.9    Recent Results (from the past 240 hour(s))  Culture, blood (routine x 2)     Status:  Abnormal (Preliminary result)   Collection Time: 11/22/22  1:08 PM   Specimen: BLOOD RIGHT FOREARM  Result Value Ref Range Status   Specimen Description   Final    BLOOD RIGHT FOREARM Performed at [facility], [address], [place], [place] [address]    Special Requests   Final    BOTTLES DRAWN AEROBIC AND ANAEROBIC Blood Culture adequate volume Performed at [facility], [address] [place], [place] [address]    Culture  Setup Time   Final    GRAM NEGATIVE RODS AEROBIC BOTTLE ONLY CRITICAL RESULT CALLED TO, READ BACK BY AND VERIFIED WITH: RN Rush Farmer ON [id number] @0723  BY SM    Culture (A)  Final    ESCHERICHIA COLI SUSCEPTIBILITIES TO FOLLOW Performed at [facility], [address], [place], [place] [address]    Report Status PENDING  Incomplete  Blood Culture ID Panel (Reflexed)     Status: Abnormal   Collection Time: 11/22/22  1:08 PM  Result Value Ref Range Status   Enterococcus faecalis NOT DETECTED NOT DETECTED Final   Enterococcus Faecium NOT DETECTED NOT DETECTED Final  Listeria monocytogenes NOT DETECTED NOT DETECTED Final   Staphylococcus species NOT DETECTED NOT DETECTED Final   Staphylococcus aureus (BCID) NOT DETECTED NOT DETECTED Final   Staphylococcus epidermidis NOT DETECTED NOT DETECTED Final   Staphylococcus lugdunensis NOT DETECTED NOT DETECTED Final   Streptococcus species NOT DETECTED NOT DETECTED Final   Streptococcus agalactiae NOT DETECTED NOT DETECTED Final   Streptococcus pneumoniae NOT DETECTED NOT DETECTED Final   Streptococcus pyogenes NOT DETECTED NOT DETECTED Final   A.calcoaceticus-baumannii NOT DETECTED NOT DETECTED Final   Bacteroides fragilis NOT DETECTED NOT DETECTED Final   Enterobacterales DETECTED (A) NOT DETECTED Final    Comment: Enterobacterales represent a large order of gram negative bacteria, not a single organism. CRITICAL RESULT CALLED TO, READ BACK BY AND VERIFIED WITH: RN Rush Farmer ON  [id number] @0723  BY SM    Enterobacter cloacae complex NOT DETECTED NOT DETECTED Final   Escherichia coli DETECTED (A) NOT DETECTED Final    Comment: CRITICAL RESULT CALLED TO, READ BACK BY AND VERIFIED WITH: RN Rush Farmer ON [id number] @0723  BY SM    Klebsiella aerogenes NOT DETECTED NOT DETECTED Final   Klebsiella oxytoca NOT DETECTED NOT DETECTED Final   Klebsiella pneumoniae NOT DETECTED NOT DETECTED Final   Proteus species NOT DETECTED NOT DETECTED Final   Salmonella species NOT DETECTED NOT DETECTED Final   Serratia marcescens NOT DETECTED NOT DETECTED Final   Haemophilus influenzae NOT DETECTED NOT DETECTED Final   Neisseria meningitidis NOT DETECTED NOT DETECTED Final   Pseudomonas aeruginosa NOT DETECTED NOT DETECTED Final   Stenotrophomonas maltophilia NOT DETECTED NOT DETECTED Final   Candida albicans NOT DETECTED NOT DETECTED Final   Candida auris NOT DETECTED NOT DETECTED Final   Candida glabrata NOT DETECTED NOT DETECTED Final   Candida krusei NOT DETECTED NOT DETECTED Final   Candida parapsilosis NOT DETECTED NOT DETECTED Final   Candida tropicalis NOT DETECTED NOT DETECTED Final   Cryptococcus neoformans/gattii NOT DETECTED NOT DETECTED Final   CTX-M ESBL NOT DETECTED NOT DETECTED Final   Carbapenem resistance IMP NOT DETECTED NOT DETECTED Final   Carbapenem resistance KPC NOT DETECTED NOT DETECTED Final   Carbapenem resistance NDM NOT DETECTED NOT DETECTED Final   Carbapenem resist OXA 48 LIKE NOT DETECTED NOT DETECTED Final   Carbapenem resistance VIM NOT DETECTED NOT DETECTED Final    Comment: Performed at [facility], [address] [place], [place] [address]  Urine Culture     Status: None   Collection Time: 11/22/22  1:26 PM   Specimen: Urine, Random  Result Value Ref Range Status   Specimen Description   Final    URINE, RANDOM Performed at [facility], [address], [place], [place] [address]    Special Requests   Final    NONE  Reflexed from [phone] [id number] Performed at [facility], [address], [place], [place] [address]    Culture   Final    NO GROWTH Performed at [facility], [address], [place], [place] [address]    Report Status 11/24/2022 FINAL  Final  SARS Coronavirus 2 by RT PCR (hospital order, performed in [facility] hospital lab) *cepheid single result test* Anterior Nasal Swab     Status: None   Collection Time: 11/22/22  1:40 PM   Specimen: Anterior Nasal Swab  Result Value Ref Range Status   SARS Coronavirus 2 by RT PCR NEGATIVE NEGATIVE Final    Comment: (NOTE) SARS-CoV-2 target nucleic acids are  NOT DETECTED.  The SARS-CoV-2 RNA is generally detectable in upper and lower respiratory specimens during the acute phase of infection. The lowest concentration of SARS-CoV-2 viral copies this assay can detect is 250 copies / mL. A negative result does not preclude SARS-CoV-2 infection and should not be used as the sole basis for treatment or other patient management decisions.  A negative result may occur with improper specimen collection / handling, submission of specimen other than nasopharyngeal swab, presence of viral mutation(s) within the areas targeted by this assay, and inadequate number of viral copies (<250 copies / mL). A negative result must be combined with clinical observations, patient history, and epidemiological information.  Fact Sheet for Patients:   RoadLapTop.co.za  Fact Sheet for Healthcare Providers: http://kim-miller.com/  This test is not yet approved or  cleared by the [place] FDA and has been authorized for detection and/or diagnosis of SARS-CoV-2 by FDA under an Emergency Use Authorization (EUA).  This EUA will remain in effect (meaning this test can be used) for the duration of the COVID-19 declaration under Section 564(b)(1) of the Act, 21 U.S.C. section 360bbb-3(b)(1), unless the  authorization is terminated or revoked sooner.  Performed at [facility], [address], [place], [place] [address]          Radiology Studies: MR CERVICAL SPINE W WO CONTRAST  Result Date: 11/24/2022 CLINICAL DATA:  Cervical radiculopathy. Infection suspected. Severe neck and shoulder pain. Bacteremia. EXAM: MRI CERVICAL SPINE WITHOUT AND WITH CONTRAST TECHNIQUE: Multiplanar and multiecho pulse sequences of the cervical spine, to include the craniocervical junction and cervicothoracic junction, were obtained without and with intravenous contrast. CONTRAST:  5mL GADAVIST GADOBUTROL 1 MMOL/ML IV SOLN COMPARISON:  CT of the cervical spine 11/22/2022 FINDINGS: Alignment: 3 mm anterolisthesis is present at C3-4. 2 mm anterolisthesis is present at C4-5. Straightening of the normal cervical lordosis is present. Vertebrae: Signal and enhancement is present diffusely in the C4 and C5 vertebral bodies. Abnormal enhancement extends into the posterior elements both levels. Cord: Cord is compressed the C4-5 level less than 6 mm. Increased T2 signal is present within the cord at C4 and C5. Extensive dural enhancement extends C2 through T1-2 posteriorly. This is most prominent from C2-3 through C7. A right ventral epidural abscess measures 6 x 2 x 12 mm posterior to the C5 vertebral body. A left ventral epidural abscess measures 4 x 2 x 10 mm posterior to the C5 vertebral body. Cord size and signal is normal above the C3 vertebral level and below the C6 vertebral level. Posterior Fossa, vertebral arteries, paraspinal tissues: Prevertebral edema and enhancement extends from the C1-2 level through C6. No abnormal fluid or enhancement extends into the mediastinum. The craniocervical junction is normal. Vertebral arteries are patent bilaterally. Disc levels: C2-3: Abnormal dural enhancement is present without focal stenosis. C3-4: More prominent circumferential dural enhancement is present.  Uncovertebral spurring contributes to mild right foraminal stenosis. C4-5: Extensive epidural enhancement compresses the spinal cord to less than 6 mm. Enhancing soft tissue extends into the foramina bilaterally. Severe left foraminal stenosis is secondary to uncovertebral and facet hypertrophy. C5-6: Circumferential epidural enhancement is present. Moderate foraminal stenosis is present bilaterally. C6-7: Abnormal dural and epidural enhancement is present without significant central canal stenosis. Abnormal enhancement extends into the foramina. C7-T1: Mild posterior dural enhancement is present. No focal disc disease or stenosis is present. IMPRESSION: 1. Discitis/osteomyelitis at C4-5 with ventral epidural abscesses at the C5 level measuring 6 x 2 x 12  mm on the right and 4 x 2 x 10 mm on the left. 2. Extensive dural enhancement extends from the C1-2 level through T1-2 posteriorly. This is most prominent from C2-3 through C7. 3. Increased T2 signal is present within the cervical spinal cord at C4 and C5 consistent with spinal cord edema. 4. Prevertebral edema and enhancement extends from the C1-2 level through C6. No extension into the mediastinum. 5. Severe left foraminal stenosis at C4-5. 6. Moderate foraminal stenosis bilaterally at C5-6. 7. Mild right foraminal stenosis at C3-4. 8. Mild posterior dural enhancement at C7-T1 without significant central canal stenosis. 9. Abnormal enhancement extends into the foramina bilaterally at C4-5, C5-6, and C6-7. Critical Value/emergent results were called by telephone at the time of interpretation on 11/24/2022 at 2:36 pm to provider [facility] , who verbally acknowledged these results. Electronically Signed   By: Marin Roberts M.D.   On: 11/24/2022 14:49   MR SHOULDER RIGHT W WO CONTRAST  Result Date: 11/24/2022 CLINICAL DATA:  Right shoulder pain.  Bacteremia EXAM: MRI OF THE RIGHT SHOULDER WITHOUT AND WITH CONTRAST TECHNIQUE: Multiplanar, multisequence  MR imaging of the right shoulder was performed before and after the administration of intravenous contrast. CONTRAST:  5mL GADAVIST GADOBUTROL 1 MMOL/ML IV SOLN COMPARISON:  X-ray 11/22/2022 FINDINGS: Technical Note: Despite efforts by the technologist and patient, motion artifact is present on today's exam and could not be eliminated. This reduces exam sensitivity and specificity. Rotator cuff: Full thickness, near-complete tear of the distal supraspinatus tendon with retraction to the level of the humeral head apex. Partial-thickness tearing involving the cranial aspect of the distal subscapularis tendon. Intact infraspinatus and teres minor tendons. Muscles: Preserved bulk and signal intensity of the rotator cuff musculature without edema, atrophy, or fatty infiltration. Biceps long head: Intra-articular biceps tendinosis. Biceps tendon is appropriately positioned within the bicipital groove. Acromioclavicular Joint: Minimal degenerative changes of the AC joint. Small volume subacromial-subdeltoid bursal fluid communicating with the glenohumeral joint. Glenohumeral Joint: Small glenohumeral joint effusion with mild enhancing synovitis. Mild diffuse chondral thinning of the glenohumeral joint. Labrum:  Appears degenerated, although suboptimally assessed. Bones: No acute fracture. No dislocation. Reactive subcortical cystic changes within the posterior aspect of the greater tuberosity. No erosion. No marrow replacing bone lesion. Other: None. IMPRESSION: RIGHT SHOULDER: 1. Small glenohumeral joint effusion with relatively mild synovitis. This is favored to be reactive although septic arthritis is not entirely excluded in the setting of bacteremia. 2. Full-thickness, near-complete tear of the distal supraspinatus tendon with retraction to the level of the humeral head apex. 3. Partial-thickness tearing of the distal subscapularis tendon. 4. Intra-articular biceps tendinosis. Electronically Signed   By: Duanne Guess D.O.   On: 11/24/2022 14:06   MR Shoulder Left W Wo Contrast  Result Date: 11/24/2022 CLINICAL DATA:  Shoulder pain, chronic, no prior imaging. Bacteremia EXAM: MRI OF THE LEFT SHOULDER WITHOUT AND WITH CONTRAST TECHNIQUE: Multiplanar, multisequence MR imaging of the left shoulder was performed before and after the administration of intravenous contrast. CONTRAST:  5mL GADAVIST GADOBUTROL 1 MMOL/ML IV SOLN COMPARISON:  X-ray 11/22/2022 FINDINGS: Technical Note: Despite efforts by the technologist and patient, motion artifact is present on today's exam and could not be eliminated. This reduces exam sensitivity and specificity. Rotator cuff: Full-thickness, near complete tear of the subscapularis tendon with minimal retraction. Severe supraspinatus and infraspinatus tendinosis. High-grade partial-thickness tear along the anterior leading edge of the distal supraspinatus tendon. Intact teres minor. Muscles: Preserved bulk and signal intensity of the  rotator cuff musculature without edema, atrophy, or fatty infiltration. Biceps long head: Long head biceps tendon is medially dislocated out of the bicipital groove. Acromioclavicular Joint: Moderate degenerative changes of the AC joint. Trace subacromial-subdeltoid bursal fluid. Glenohumeral Joint: Small glenohumeral joint effusion with relatively mild synovitis. No cartilage defect. Labrum:  Suboptimally assessed. Bones: No acute fracture. No dislocation. Degenerative subchondral marrow edema centered at the [facility] joint with small subchondral cystic changes. No erosion. No marrow replacing bone lesion. Other: None. IMPRESSION: LEFT SHOULDER: 1. Small glenohumeral joint effusion with relatively mild synovitis. This is favored to be reactive although septic arthritis is not entirely excluded in the setting of bacteremia. 2. Full-thickness, near-complete tear of the subscapularis tendon with minimal retraction. 3. Severe supraspinatus and infraspinatus tendinosis  with high-grade partial-thickness tear along the anterior leading edge of the distal supraspinatus tendon. 4. Medial dislocation of the long head biceps tendon out of the bicipital groove. 5. Moderate AC joint osteoarthritis. Electronically Signed   By: Duanne Guess D.O.   On: 11/24/2022 14:01   ECHOCARDIOGRAM COMPLETE  Result Date: 11/23/2022    ECHOCARDIOGRAM REPORT   Patient Name:   [facility]  Date of Exam: 11/23/2022 Medical Rec #:  [id number]  Height:       63.0 in Accession #:    [id number] Weight:       121.3 lb Date of Birth:  16-Jun-1950  BSA:          1.563 m Patient Age:    71 years   BP:           157/69 mmHg Patient Gender: M          HR:           82 bpm. Exam Location:  Inpatient Procedure: 2D Echo, Color Doppler and Cardiac Doppler Indications:    Bacteremia  History:        Patient has no prior history of Echocardiogram examinations.                 Risk Factors:Hypertension and Dyslipidemia.  Sonographer:    Irving Burton Senior RDCS Referring Phys: [id number] DAVID MANUEL ORTIZ  Sonographer Comments: Suboptimal apical window due to lung interference. IMPRESSIONS  1. Left ventricular ejection fraction, by estimation, is 55 to 60%. The left ventricle has normal function. The left ventricle has no regional wall motion abnormalities. Left ventricular diastolic parameters were normal.  2. Right ventricular systolic function is normal. The right ventricular size is normal. There is mildly elevated pulmonary artery systolic pressure. The estimated right ventricular systolic pressure is 38.7 mmHg.  3. Left atrial size was mildly dilated.  4. The mitral valve is normal in structure. Mild mitral valve regurgitation. No evidence of mitral stenosis.  5. The aortic valve is tricuspid. There is mild thickening of the aortic valve. Aortic valve regurgitation is moderate. No aortic stenosis is present.  6. 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  7. The inferior vena cava is normal in size with <50% respiratory  variability, suggesting right atrial pressure of 8 mmHg. Comparison(s): Regurgitant valve lesions raise concern for endocarditis in setting of bacteremia, consider TEE if clinically indicated. FINDINGS  Left Ventricle: Left ventricular ejection fraction, by estimation, is 55 to 60%. The left ventricle has normal function. The left ventricle has no regional wall motion abnormalities. The left ventricular internal cavity size was normal in size. There is  borderline left ventricular hypertrophy. Left ventricular diastolic parameters were normal. Right Ventricle: The right ventricular size is normal. No  increase in right ventricular wall thickness. Right ventricular systolic function is normal. There is mildly elevated pulmonary artery systolic pressure. The tricuspid regurgitant velocity is 2.77  m/s, and with an assumed right atrial pressure of 8 mmHg, the estimated right ventricular systolic pressure is 38.7 mmHg. Left Atrium: Left atrial size was mildly dilated. Right Atrium: Right atrial size was normal in size. Pericardium: There is no evidence of pericardial effusion. Mitral Valve: The mitral valve is normal in structure. Mild mitral valve regurgitation. No evidence of mitral valve stenosis. Tricuspid Valve: The tricuspid valve is normal in structure. Tricuspid valve regurgitation is mild . No evidence of tricuspid stenosis. Aortic Valve: The aortic valve is tricuspid. There is mild thickening of the aortic valve. Aortic valve regurgitation is moderate. Aortic regurgitation PHT measures 416 msec. No aortic stenosis is present. Pulmonic Valve: The pulmonic valve was not well visualized. 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 No evidence of pulmonic stenosis. Aorta: The aortic root and ascending aorta are structurally normal, with no evidence of dilitation. Venous: The inferior vena cava is normal in size with less than 50% respiratory variability, suggesting right atrial pressure of 8 mmHg. IAS/Shunts: The  atrial septum is grossly normal.  LEFT VENTRICLE PLAX 2D LVIDd:         4.90 cm   Diastology LVIDs:         3.20 cm   LV e' medial:    10.40 cm/s LV PW:         1.00 cm   LV E/e' medial:  8.0 LV IVS:        1.00 cm   LV e' lateral:   11.70 cm/s LVOT diam:     2.10 cm   LV E/e' lateral: 7.1 LV SV:         73 LV SV Index:   47 LVOT Area:     3.46 cm  RIGHT VENTRICLE RV S prime:     9.90 cm/s TAPSE (M-mode): 2.1 cm LEFT ATRIUM             Index        RIGHT ATRIUM           Index LA diam:        3.70 cm 2.37 cm/m   RA Area:     16.10 cm LA Vol (A2C):   57.8 ml 36.98 ml/m  RA Volume:   35.40 ml  22.65 ml/m LA Vol (A4C):   48.4 ml 30.96 ml/m LA Biplane Vol: 53.9 ml 34.48 ml/m  AORTIC VALVE LVOT Vmax:   97.30 cm/s LVOT Vmean:  72.600 cm/s LVOT VTI:    0.212 m AI PHT:      416 msec  AORTA Ao Root diam: 3.60 cm Ao Asc diam:  3.60 cm MITRAL VALVE               TRICUSPID VALVE MV Area (PHT): 3.42 cm    TR Peak grad:   30.7 mmHg MV Decel Time: 222 msec    TR Vmax:        277.00 cm/s MV E velocity: 83.60 cm/s MV A velocity: 78.00 cm/s  SHUNTS MV E/A ratio:  1.07        Systemic VTI:  0.21 m                            Systemic Diam: 2.10 cm Weston Brass MD Electronically signed by Weston Brass MD Signature Date/Time: 11/23/2022/5:24:19  PM    Final    CT ABDOMEN PELVIS W CONTRAST  Result Date: 11/23/2022 CLINICAL DATA:  Suspected pulmonary embolism. EXAM: CT ANGIOGRAPHY CHEST CT ABDOMEN AND PELVIS WITH CONTRAST TECHNIQUE: Multidetector CT imaging of the chest was performed using the standard protocol during bolus administration of intravenous contrast. Multiplanar CT image reconstructions and MIPs were obtained to evaluate the vascular anatomy. Multidetector CT imaging of the abdomen and pelvis was performed using the standard protocol during bolus administration of intravenous contrast. RADIATION DOSE REDUCTION: This exam was performed according to the departmental dose-optimization program which includes  automated exposure control, adjustment of the mA and/or kV according to patient size and/or use of iterative reconstruction technique. CONTRAST:  80mL OMNIPAQUE IOHEXOL 350 MG/ML SOLN COMPARISON:  November 09, 2014. FINDINGS: CTA CHEST FINDINGS Cardiovascular: Satisfactory opacification of the pulmonary arteries to the segmental level. No evidence of pulmonary embolism. Normal heart size. No pericardial effusion. Coronary artery calcifications are noted. Mediastinum/Nodes: No enlarged mediastinal, hilar, or axillary lymph nodes. Thyroid gland, trachea, and esophagus demonstrate no significant findings. Lungs/Pleura: No pneumothorax or pleural effusion is noted. Mild emphysematous disease is noted. Minimal right posterior basilar subsegmental atelectasis is noted. Musculoskeletal: No chest wall abnormality. No acute or significant osseous findings. Review of the MIP images confirms the above findings. CT ABDOMEN and PELVIS FINDINGS Hepatobiliary: No focal liver abnormality is seen. No gallstones, gallbladder wall thickening, or biliary dilatation. Pancreas: Unremarkable. No pancreatic ductal dilatation or surrounding inflammatory changes. Spleen: Normal in size without focal abnormality. Adrenals/Urinary Tract: Adrenal glands appear normal. Left renal cysts are noted for which no further follow-up is required. No hydronephrosis or renal obstruction is noted. Mild urinary bladder distention is noted. Stomach/Bowel: Stomach is within normal limits. Appendix appears normal. No evidence of bowel wall thickening, distention, or inflammatory changes. Vascular/Lymphatic: Aortic atherosclerosis. No enlarged abdominal or pelvic lymph nodes. Reproductive: Prostate is unremarkable. Other: No abdominal wall hernia or abnormality. No abdominopelvic ascites. Musculoskeletal: No acute or significant osseous findings. Review of the MIP images confirms the above findings. IMPRESSION: No definite evidence of pulmonary embolus. Mild  urinary bladder distention is noted. Coronary artery calcifications are noted. Aortic Atherosclerosis (ICD10-I70.0) and Emphysema (ICD10-J43.9). Electronically Signed   By: Lupita Raider M.D.   On: 11/23/2022 15:47   CT Angio Chest Pulmonary Embolism (PE) W or WO Contrast  Result Date: 11/23/2022 CLINICAL DATA:  Suspected pulmonary embolism. EXAM: CT ANGIOGRAPHY CHEST CT ABDOMEN AND PELVIS WITH CONTRAST TECHNIQUE: Multidetector CT imaging of the chest was performed using the standard protocol during bolus administration of intravenous contrast. Multiplanar CT image reconstructions and MIPs were obtained to evaluate the vascular anatomy. Multidetector CT imaging of the abdomen and pelvis was performed using the standard protocol during bolus administration of intravenous contrast. RADIATION DOSE REDUCTION: This exam was performed according to the departmental dose-optimization program which includes automated exposure control, adjustment of the mA and/or kV according to patient size and/or use of iterative reconstruction technique. CONTRAST:  80mL OMNIPAQUE IOHEXOL 350 MG/ML SOLN COMPARISON:  November 09, 2014. FINDINGS: CTA CHEST FINDINGS Cardiovascular: Satisfactory opacification of the pulmonary arteries to the segmental level. No evidence of pulmonary embolism. Normal heart size. No pericardial effusion. Coronary artery calcifications are noted. Mediastinum/Nodes: No enlarged mediastinal, hilar, or axillary lymph nodes. Thyroid gland, trachea, and esophagus demonstrate no significant findings. Lungs/Pleura: No pneumothorax or pleural effusion is noted. Mild emphysematous disease is noted. Minimal right posterior basilar subsegmental atelectasis is noted. Musculoskeletal: No chest wall abnormality. No acute or significant  osseous findings. Review of the MIP images confirms the above findings. CT ABDOMEN and PELVIS FINDINGS Hepatobiliary: No focal liver abnormality is seen. No gallstones, gallbladder wall  thickening, or biliary dilatation. Pancreas: Unremarkable. No pancreatic ductal dilatation or surrounding inflammatory changes. Spleen: Normal in size without focal abnormality. Adrenals/Urinary Tract: Adrenal glands appear normal. Left renal cysts are noted for which no further follow-up is required. No hydronephrosis or renal obstruction is noted. Mild urinary bladder distention is noted. Stomach/Bowel: Stomach is within normal limits. Appendix appears normal. No evidence of bowel wall thickening, distention, or inflammatory changes. Vascular/Lymphatic: Aortic atherosclerosis. No enlarged abdominal or pelvic lymph nodes. Reproductive: Prostate is unremarkable. Other: No abdominal wall hernia or abnormality. No abdominopelvic ascites. Musculoskeletal: No acute or significant osseous findings. Review of the MIP images confirms the above findings. IMPRESSION: No definite evidence of pulmonary embolus. Mild urinary bladder distention is noted. Coronary artery calcifications are noted. Aortic Atherosclerosis (ICD10-I70.0) and Emphysema (ICD10-J43.9). Electronically Signed   By: Lupita Raider M.D.   On: 11/23/2022 15:47        Scheduled Meds:  lidocaine  1 patch Transdermal Q24H   losartan  50 mg Oral Daily   potassium chloride  40 mEq Oral Once   Continuous Infusions:  cefTRIAXone (ROCEPHIN)  IV 2 g (11/24/22 1006)   dextrose 5 % and 0.9 % NaCl with KCl 40 mEq/L     vancomycin            ,, MD [facility] 11/25/2022, 8:34 AM

## 2022-11-25 NOTE — Anesthesia Preprocedure Evaluation (Signed)
Anesthesia Evaluation  Patient identified by MRN, date of birth, ID band Patient awake    Reviewed: Allergy & Precautions, NPO status , Patient's Chart, lab work & pertinent test results  Airway Mallampati: II  TM Distance: >3 FB Neck ROM: Limited    Dental  (+) Poor Dentition   Pulmonary Current Smoker   Pulmonary exam normal        Cardiovascular hypertension, Pt. on medications + CAD  Normal cardiovascular exam  ECHO 07/24:  1. Left ventricular ejection fraction, by estimation, is 55 to 60%. The  left ventricle has normal function. The left ventricle has no regional  wall motion abnormalities. Left ventricular diastolic parameters were  normal.   2. Right ventricular systolic function is normal. The right ventricular  size is normal. There is mildly elevated pulmonary artery systolic  pressure. The estimated right ventricular systolic pressure is 38.7 mmHg.   3. Left atrial size was mildly dilated.   4. The mitral valve is normal in structure. Mild mitral valve  regurgitation. No evidence of mitral stenosis.   5. The aortic valve is tricuspid. There is mild thickening of the aortic  valve. Aortic valve regurgitation is moderate. No aortic stenosis is  present.   6. 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   7. The inferior vena cava is normal in size with <50% respiratory  variability, suggesting right atrial pressure of 8 mmHg.     Neuro/Psych Cervical epidural abscess  negative psych ROS   GI/Hepatic negative GI ROS, Neg liver ROS,,,  Endo/Other  negative endocrine ROS    Renal/GU negative Renal ROS  negative genitourinary   Musculoskeletal negative musculoskeletal ROS (+)    Abdominal Normal abdominal exam  (+)   Peds  Hematology  (+) Blood dyscrasia, anemia Lab Results      Component                Value               Date                      WBC                      9.3                 11/25/2022                 HGB                      11.0 (L)            11/25/2022                HCT                      32.2 (L)            11/25/2022                MCV                      84.1                11/25/2022                PLT                      145 (L)  11/25/2022             Lab Results      Component                Value               Date                      NA                       136                 11/25/2022                K                        3.1 (L)             11/25/2022                CO2                      24                  11/25/2022                GLUCOSE                  145 (H)             11/25/2022                BUN                      22                  11/25/2022                CREATININE               0.76                11/25/2022                CALCIUM                  7.5 (L)             11/25/2022                GFRNONAA                 >60                 11/25/2022              Anesthesia Other Findings   Reproductive/Obstetrics                             Anesthesia Physical Anesthesia Plan  ASA: 3 and emergent  Anesthesia Plan: General   Post-op Pain Management:    Induction: Intravenous  PONV Risk Score and Plan: 1 and Ondansetron, Dexamethasone and Treatment may vary due to age or medical condition  Airway Management Planned: Mask and Oral ETT  Additional Equipment: None  Intra-op Plan:   Post-operative Plan: Extubation in OR  Informed Consent: I have reviewed the patients History and Physical, chart, labs and discussed  the procedure including the risks, benefits and alternatives for the proposed anesthesia with the patient or authorized representative who has indicated his/her understanding and acceptance.     Dental advisory given and Interpreter used for interveiw  Plan Discussed with: CRNA  Anesthesia Plan Comments:        Anesthesia Quick Evaluation

## 2022-11-25 NOTE — Progress Notes (Signed)
Arrived via [facility] to [facility] from [facility].

## 2022-11-25 NOTE — Progress Notes (Signed)
Pt. Was transferred to [facility]. Pt. Transported via [facility]. Pt. Is alert and oriented, no distress. Family aware of the discharge. All patient belongings are with the family.

## 2022-11-25 NOTE — Progress Notes (Signed)
PROGRESS NOTE    Frederick Andrews  NWG:[id number] DOB: Sep 14, 1950 DOA: 11/23/2022 PCP: Patient, No Pcp Per   Brief Narrative:  72 y.o. male with medical history significant of hyperlipidemia, hypertension, tobacco use presented with positive blood cultures.  Patient had presented to the ED 1 day prior to presentation after left shoulder pain after having a fall.  Blood cultures were obtained because of complaints of low-grade temperatures.  Subsequently, blood cultures turned positive for gram-negative rods and he was recalled to the ED.  On presentation, WBC of 9.6.  CTA chest showed no PE.  CT abdomen and pelvis with contrast showed mild urinary bladder distention.  On 11/22/2022, patient had CT of the head without contrast which showed no acute intracranial abnormity.  CT cervical spine showed no acute cervical spine fracture or traumatic malalignment but showed multilevel cervical spondylosis, worse at C5-C6 with mild spinal canal stenosis.  He was started on IV antibiotics and fluids.  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 coli bacteremia Possible UTI -Patient had presented on 11/22/2022 and UA was suggestive of possible UTI.  Blood cultures drawn on that day is positive for E. coli.  Follow sensitivities. -Echo showed EF of 55 to 60% with mild mitral valve regurgitation, moderate aortic regurgitation and severe pulmonary valve regurgitation.  I had communicated with cardiology via secure chat on 11/24/2022 regarding need for TEE: this was being arranged but subsequently this has been postponed for now because of finding of epidural abscess on imaging -MRI of cervical spine showed possible C4-5 discitis/osteomyelitis with C5 epidural abscess.  Continue Rocephin and vancomycin.  Follow repeat blood cultures from 11/24/2022 -Currently afebrile and hemodynamically stable. -Patient is waiting to be transferred to [facility] to be evaluated by neurosurgery/Dr.  Danielle Dess.  Currently NPO. -ID has also been consulted: Follow recommendations  Severe neck and bilateral shoulder pain -MRI neck as above.  MRI bilateral shoulders was reviewed by orthopedics who thought that patient did not have septic arthritis but his symptoms are possibly due to severe rotator cuff pathology and glenohumeral arthritis.  No need for any urgent orthopedic intervention as per orthopedics. -Continue pain management.   Acute metabolic acidosis -Currently on bicarb drip.  Acidosis has resolved.  Hypoglycemia -From poor oral intake.  Currently NPO.  Switch IV fluids to dextrose drip.  Normocytic anemia -Questionable cause.  Hemoglobin stable.  Monitor intermittently  Thrombocytopenia -Questionable cause.  Monitor intermittently.  No signs of bleeding.  DVT prophylaxis: Lovenox held on 11/16/2022 for need for possible surgical intervention  code Status: Full Family Communication: Daughter at bedside  Disposition Plan: Status is: Inpatient Remains inpatient appropriate because: Of severity of illness    Consultants: None  Procedures: 2D echo  Antimicrobials: Rocephin from 11/23/2022 onwards.  Vancomycin from 11/24/2022 onwards   Subjective: Patient seen and examined at bedside.  Does not speak English much: Daughter at bedside translated for the patient.  Still complain of neck and bilateral shoulder pain.  No seizures, fever, vomiting or agitation reported Objective: Vitals:   11/24/22 0424 11/24/22 1357 11/24/22 2054 11/25/22 0424  BP: (!) 186/79 (!) 177/76 (!) 144/65 (!) 154/65  Pulse: 90 78 71 65  Resp: 20 17 20 18   Temp: 98.6 F (37 C) 98.2 F (36.8 C) 98.4 F (36.9 C) 97.7 F (36.5 C)  TempSrc: Oral  Oral Oral  SpO2: 98% 95% 97% 94%  Weight:      Height:        Intake/Output  Summary (Last 24 hours) at 11/25/2022 0807 Last data filed at 11/25/2022 0309 Gross per 24 hour  Intake 1537.75 ml  Output --  Net 1537.75 ml   Filed Weights   11/23/22  0910  Weight: 55 kg    Examination:  General: No acute distress.  On room air currently.   ENT/neck: No thyromegaly.  JVD is not elevated  respiratory: Decreased breath sounds at bases bilaterally with some crackles; no wheezing  CVS: S1-S2 heard, rate controlled currently Abdominal: Soft, nontender, slightly distended; no organomegaly,  bowel sounds are heard Extremities: Trace lower extremity edema; no cyanosis  CNS: Alert and awake.  No focal neurologic deficit.  Moves extremities Lymph: No obvious lymphadenopathy Skin: No obvious ecchymosis/lesions  psych: Currently not agitated.  Mostly flat affect.   Musculoskeletal: Neck and bilateral shoulder tenderness present with decreased range of motion   Data Reviewed: I have personally reviewed following labs and imaging studies  CBC: Recent Labs  Lab 11/22/22 1312 11/23/22 1400 11/24/22 0637 11/25/22 0530  WBC 9.6 8.7 9.4 9.3  NEUTROABS 7.9* 6.7  --  6.4  HGB 13.8 12.8* 11.4* 11.0*  HCT 41.8 38.6* 34.8* 32.2*  MCV 86.7 87.1 87.7 84.1  PLT 122* 122* 122* 145*   Basic Metabolic Panel: Recent Labs  Lab 11/22/22 1312 11/23/22 1400 11/24/22 0637 11/25/22 0530  NA 135 134* 137 136  K 4.1 4.0 3.8 3.1*  CL 102 104 109 104  CO2 17* 16* 14* 24  GLUCOSE 103* 71 69* 145*  BUN 36* 41* 31* 22  CREATININE 1.13 1.03 0.95 0.76  CALCIUM 9.0 8.0* 7.8* 7.5*  MG  --  2.3  --  2.2  PHOS  --  4.1  --   --    GFR: Estimated Creatinine Clearance: 64 mL/min (by C-G formula based on SCr of 0.76 mg/dL). Liver Function Tests: Recent Labs  Lab 11/22/22 1312 11/23/22 1400 11/24/22 0637 11/25/22 0530  AST 28 25 27 26   ALT 32 29 28 28   ALKPHOS 162* 157* 184* 238*  BILITOT 1.8* 1.5* 1.6* 1.0  PROT 8.1 6.6 6.0* 5.6*  ALBUMIN 3.3* 2.6* 2.4* 2.2*   No results for input(s): "LIPASE", "AMYLASE" in the last 168 hours. No results for input(s): "AMMONIA" in the last 168 hours. Coagulation Profile: Recent Labs  Lab 11/23/22 1400  INR  1.0   Cardiac Enzymes: No results for input(s): "CKTOTAL", "CKMB", "CKMBINDEX", "TROPONINI" in the last 168 hours. BNP (last 3 results) No results for input(s): "PROBNP" in the last 8760 hours. HbA1C: No results for input(s): "HGBA1C" in the last 72 hours. CBG: No results for input(s): "GLUCAP" in the last 168 hours. Lipid Profile: No results for input(s): "CHOL", "HDL", "LDLCALC", "TRIG", "CHOLHDL", "LDLDIRECT" in the last 72 hours. Thyroid Function Tests: No results for input(s): "TSH", "T4TOTAL", "FREET4", "T3FREE", "THYROIDAB" in the last 72 hours. Anemia Panel: Recent Labs    11/24/22 0637  VITAMINB12 1,366*  FOLATE 6.8  FERRITIN 407*  TIBC 227*  IRON 18*  RETICCTPCT 0.4   Sepsis Labs: Recent Labs  Lab 11/22/22 1335 11/22/22 1504  LATICACIDVEN 1.8 0.9    Recent Results (from the past 240 hour(s))  Culture, blood (routine x 2)     Status: Abnormal (Preliminary result)   Collection Time: 11/22/22  1:08 PM   Specimen: BLOOD RIGHT FOREARM  Result Value Ref Range Status   Specimen Description   Final    BLOOD RIGHT FOREARM Performed at [facility], [address]  [place], [place] [address]    Special Requests   Final    BOTTLES DRAWN AEROBIC AND ANAEROBIC Blood Culture adequate volume Performed at [facility], [address], [place], [place] [address]    Culture  Setup Time   Final    GRAM NEGATIVE RODS AEROBIC BOTTLE ONLY CRITICAL RESULT CALLED TO, READ BACK BY AND VERIFIED WITH: RN Rush Farmer ON [id number] @0723  BY SM    Culture (A)  Final    ESCHERICHIA COLI SUSCEPTIBILITIES TO FOLLOW Performed at [facility], [address] [place], [place] [address]    Report Status PENDING  Incomplete  Blood Culture ID Panel (Reflexed)     Status: Abnormal   Collection Time: 11/22/22  1:08 PM  Result Value Ref Range Status   Enterococcus faecalis NOT DETECTED NOT DETECTED Final   Enterococcus Faecium NOT DETECTED NOT DETECTED Final    Listeria monocytogenes NOT DETECTED NOT DETECTED Final   Staphylococcus species NOT DETECTED NOT DETECTED Final   Staphylococcus aureus (BCID) NOT DETECTED NOT DETECTED Final   Staphylococcus epidermidis NOT DETECTED NOT DETECTED Final   Staphylococcus lugdunensis NOT DETECTED NOT DETECTED Final   Streptococcus species NOT DETECTED NOT DETECTED Final   Streptococcus agalactiae NOT DETECTED NOT DETECTED Final   Streptococcus pneumoniae NOT DETECTED NOT DETECTED Final   Streptococcus pyogenes NOT DETECTED NOT DETECTED Final   A.calcoaceticus-baumannii NOT DETECTED NOT DETECTED Final   Bacteroides fragilis NOT DETECTED NOT DETECTED Final   Enterobacterales DETECTED (A) NOT DETECTED Final    Comment: Enterobacterales represent a large order of gram negative bacteria, not a single organism. CRITICAL RESULT CALLED TO, READ BACK BY AND VERIFIED WITH: RN Rush Farmer ON [id number] @0723  BY SM    Enterobacter cloacae complex NOT DETECTED NOT DETECTED Final   Escherichia coli DETECTED (A) NOT DETECTED Final    Comment: CRITICAL RESULT CALLED TO, READ BACK BY AND VERIFIED WITH: RN Rush Farmer ON [id number] @0723  BY SM    Klebsiella aerogenes NOT DETECTED NOT DETECTED Final   Klebsiella oxytoca NOT DETECTED NOT DETECTED Final   Klebsiella pneumoniae NOT DETECTED NOT DETECTED Final   Proteus species NOT DETECTED NOT DETECTED Final   Salmonella species NOT DETECTED NOT DETECTED Final   Serratia marcescens NOT DETECTED NOT DETECTED Final   Haemophilus influenzae NOT DETECTED NOT DETECTED Final   Neisseria meningitidis NOT DETECTED NOT DETECTED Final   Pseudomonas aeruginosa NOT DETECTED NOT DETECTED Final   Stenotrophomonas maltophilia NOT DETECTED NOT DETECTED Final   Candida albicans NOT DETECTED NOT DETECTED Final   Candida auris NOT DETECTED NOT DETECTED Final   Candida glabrata NOT DETECTED NOT DETECTED Final   Candida krusei NOT DETECTED NOT DETECTED Final   Candida parapsilosis NOT DETECTED NOT  DETECTED Final   Candida tropicalis NOT DETECTED NOT DETECTED Final   Cryptococcus neoformans/gattii NOT DETECTED NOT DETECTED Final   CTX-M ESBL NOT DETECTED NOT DETECTED Final   Carbapenem resistance IMP NOT DETECTED NOT DETECTED Final   Carbapenem resistance KPC NOT DETECTED NOT DETECTED Final   Carbapenem resistance NDM NOT DETECTED NOT DETECTED Final   Carbapenem resist OXA 48 LIKE NOT DETECTED NOT DETECTED Final   Carbapenem resistance VIM NOT DETECTED NOT DETECTED Final    Comment: Performed at [facility], [address] [place], [place] [address]  Urine Culture     Status: None   Collection Time: 11/22/22  1:26 PM   Specimen: Urine, Random  Result Value Ref Range Status  Specimen Description   Final    URINE, RANDOM Performed at [facility], [address] [place], [place] [address]    Special Requests   Final    NONE Reflexed from [id number] Performed at [facility], [address] [place], [place] [address]    Culture   Final    NO GROWTH Performed at [facility], [address] [place], [place] [address]    Report Status 11/24/2022 FINAL  Final  SARS Coronavirus 2 by RT PCR (hospital order, performed in [facility] hospital lab) *cepheid single result test* Anterior Nasal Swab     Status: None   Collection Time: 11/22/22  1:40 PM   Specimen: Anterior Nasal Swab  Result Value Ref Range Status   SARS Coronavirus 2 by RT PCR NEGATIVE NEGATIVE Final    Comment: (NOTE) SARS-CoV-2 target nucleic acids are NOT DETECTED.  The SARS-CoV-2 RNA is generally detectable in upper and lower respiratory specimens during the acute phase of infection. The lowest concentration of SARS-CoV-2 viral copies this assay can detect is 250 copies / mL. A negative result does not preclude SARS-CoV-2 infection and should not be used as the sole basis for treatment or other patient management decisions.  A negative result may occur  with improper specimen collection / handling, submission of specimen other than nasopharyngeal swab, presence of viral mutation(s) within the areas targeted by this assay, and inadequate number of viral copies (<250 copies / mL). A negative result must be combined with clinical observations, patient history, and epidemiological information.  Fact Sheet for Patients:   RoadLapTop.co.za  Fact Sheet for Healthcare Providers: http://kim-miller.com/  This test is not yet approved or  cleared by the [place] FDA and has been authorized for detection and/or diagnosis of SARS-CoV-2 by FDA under an Emergency Use Authorization (EUA).  This EUA will remain in effect (meaning this test can be used) for the duration of the COVID-19 declaration under Section 564(b)(1) of the Act, 21 U.S.C. section 360bbb-3(b)(1), unless the authorization is terminated or revoked sooner.  Performed at [facility], [address], [place], [place] [address]          Radiology Studies: MR CERVICAL SPINE W WO CONTRAST  Result Date: 11/24/2022 CLINICAL DATA:  Cervical radiculopathy. Infection suspected. Severe neck and shoulder pain. Bacteremia. EXAM: MRI CERVICAL SPINE WITHOUT AND WITH CONTRAST TECHNIQUE: Multiplanar and multiecho pulse sequences of the cervical spine, to include the craniocervical junction and cervicothoracic junction, were obtained without and with intravenous contrast. CONTRAST:  5mL GADAVIST GADOBUTROL 1 MMOL/ML IV SOLN COMPARISON:  CT of the cervical spine 11/22/2022 FINDINGS: Alignment: 3 mm anterolisthesis is present at C3-4. 2 mm anterolisthesis is present at C4-5. Straightening of the normal cervical lordosis is present. Vertebrae: Signal and enhancement is present diffusely in the C4 and C5 vertebral bodies. Abnormal enhancement extends into the posterior elements both levels. Cord: Cord is compressed the C4-5 level less  than 6 mm. Increased T2 signal is present within the cord at C4 and C5. Extensive dural enhancement extends C2 through T1-2 posteriorly. This is most prominent from C2-3 through C7. A right ventral epidural abscess measures 6 x 2 x 12 mm posterior to the C5 vertebral body. A left ventral epidural abscess measures 4 x 2 x 10 mm posterior to the C5 vertebral body. Cord size and signal is normal above the C3 vertebral level and below the C6 vertebral level. Posterior Fossa, vertebral arteries, paraspinal tissues:  Prevertebral edema and enhancement extends from the C1-2 level through C6. No abnormal fluid or enhancement extends into the mediastinum. The craniocervical junction is normal. Vertebral arteries are patent bilaterally. Disc levels: C2-3: Abnormal dural enhancement is present without focal stenosis. C3-4: More prominent circumferential dural enhancement is present. Uncovertebral spurring contributes to mild right foraminal stenosis. C4-5: Extensive epidural enhancement compresses the spinal cord to less than 6 mm. Enhancing soft tissue extends into the foramina bilaterally. Severe left foraminal stenosis is secondary to uncovertebral and facet hypertrophy. C5-6: Circumferential epidural enhancement is present. Moderate foraminal stenosis is present bilaterally. C6-7: Abnormal dural and epidural enhancement is present without significant central canal stenosis. Abnormal enhancement extends into the foramina. C7-T1: Mild posterior dural enhancement is present. No focal disc disease or stenosis is present. IMPRESSION: 1. 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 2. Extensive dural enhancement extends from the C1-2 level through T1-2 posteriorly. This is most prominent from C2-3 through C7. 3. Increased T2 signal is present within the cervical spinal cord at C4 and C5 consistent with spinal cord edema. 4.  Prevertebral edema and enhancement extends from the C1-2 level through C6. No extension into the mediastinum. 5. Severe left foraminal stenosis at C4-5. 6. Moderate foraminal stenosis bilaterally at C5-6. 7. Mild right foraminal stenosis at C3-4. 8. Mild posterior dural enhancement at C7-T1 without significant central canal stenosis. 9. Abnormal enhancement extends into the foramina bilaterally at C4-5, C5-6, and C6-7. Critical Value/emergent results were called by telephone at the time of interpretation on 11/24/2022 at 2:36 pm to provider [facility] , who verbally acknowledged these results. Electronically Signed   By: Marin Roberts M.D.   On: 11/24/2022 14:49   MR SHOULDER RIGHT W WO CONTRAST  Result Date: 11/24/2022 CLINICAL DATA:  Right shoulder pain.  Bacteremia EXAM: MRI OF THE RIGHT SHOULDER WITHOUT AND WITH CONTRAST TECHNIQUE: Multiplanar, multisequence MR imaging of the right shoulder was performed before and after the administration of intravenous contrast. CONTRAST:  5mL GADAVIST GADOBUTROL 1 MMOL/ML IV SOLN COMPARISON:  X-ray 11/22/2022 FINDINGS: Technical Note: Despite efforts by the technologist and patient, motion artifact is present on today's exam and could not be eliminated. This reduces exam sensitivity and specificity. Rotator cuff: Full thickness, near-complete tear of the distal supraspinatus tendon with retraction to the level of the humeral head apex. Partial-thickness tearing involving the cranial aspect of the distal subscapularis tendon. Intact infraspinatus and teres minor tendons. Muscles: Preserved bulk and signal intensity of the rotator cuff musculature without edema, atrophy, or fatty infiltration. Biceps long head: Intra-articular biceps tendinosis. Biceps tendon is appropriately positioned within the bicipital groove. Acromioclavicular Joint: Minimal degenerative changes of the AC joint. Small volume subacromial-subdeltoid bursal fluid communicating with the  glenohumeral joint. Glenohumeral Joint: Small glenohumeral joint effusion with mild enhancing synovitis. Mild diffuse chondral thinning of the glenohumeral joint. Labrum:  Appears degenerated, although suboptimally assessed. Bones: No acute fracture. No dislocation. Reactive subcortical cystic changes within the posterior aspect of the greater tuberosity. No erosion. No marrow replacing bone lesion. Other: None. IMPRESSION: RIGHT SHOULDER: 1. Small glenohumeral joint effusion with relatively mild synovitis. This is favored to be reactive although septic arthritis is not entirely excluded in the setting of bacteremia. 2. Full-thickness, near-complete tear of the distal supraspinatus tendon with retraction to the level of the humeral head apex. 3. Partial-thickness tearing of the distal  subscapularis tendon. 4. Intra-articular biceps tendinosis. Electronically Signed   By: Duanne Guess D.O.   On: 11/24/2022 14:06   MR Shoulder Left W Wo Contrast  Result Date: 11/24/2022 CLINICAL DATA:  Shoulder pain, chronic, no prior imaging. Bacteremia EXAM: MRI OF THE LEFT SHOULDER WITHOUT AND WITH CONTRAST TECHNIQUE: Multiplanar, multisequence MR imaging of the left shoulder was performed before and after the administration of intravenous contrast. CONTRAST:  5mL GADAVIST GADOBUTROL 1 MMOL/ML IV SOLN COMPARISON:  X-ray 11/22/2022 FINDINGS: Technical Note: Despite efforts by the technologist and patient, motion artifact is present on today's exam and could not be eliminated. This reduces exam sensitivity and specificity. Rotator cuff: Full-thickness, near complete tear of the subscapularis tendon with minimal retraction. Severe supraspinatus and infraspinatus tendinosis. High-grade partial-thickness tear along the anterior leading edge of the distal supraspinatus tendon. Intact teres minor. Muscles: Preserved bulk and signal intensity of the rotator cuff musculature without edema, atrophy, or fatty infiltration. Biceps long  head: Long head biceps tendon is medially dislocated out of the bicipital groove. Acromioclavicular Joint: Moderate degenerative changes of the AC joint. Trace subacromial-subdeltoid bursal fluid. Glenohumeral Joint: Small glenohumeral joint effusion with relatively mild synovitis. No cartilage defect. Labrum:  Suboptimally assessed. Bones: No acute fracture. No dislocation. Degenerative subchondral marrow edema centered at the [facility] joint with small subchondral cystic changes. No erosion. No marrow replacing bone lesion. Other: None. IMPRESSION: LEFT SHOULDER: 1. Small glenohumeral joint effusion with relatively mild synovitis. This is favored to be reactive although septic arthritis is not entirely excluded in the setting of bacteremia. 2. Full-thickness, near-complete tear of the subscapularis tendon with minimal retraction. 3. Severe supraspinatus and infraspinatus tendinosis with high-grade partial-thickness tear along the anterior leading edge of the distal supraspinatus tendon. 4. Medial dislocation of the long head biceps tendon out of the bicipital groove. 5. Moderate AC joint osteoarthritis. Electronically Signed   By: Duanne Guess D.O.   On: 11/24/2022 14:01   ECHOCARDIOGRAM COMPLETE  Result Date: 11/23/2022    ECHOCARDIOGRAM REPORT   Patient Name:   [facility]  Date of Exam: 11/23/2022 Medical Rec #:  [id number]  Height:       63.0 in Accession #:    [id number] Weight:       121.3 lb Date of Birth:  07-Dec-1950  BSA:          1.563 m Patient Age:    71 years   BP:           157/69 mmHg Patient Gender: M          HR:           82 bpm. Exam Location:  Inpatient Procedure: 2D Echo, Color Doppler and Cardiac Doppler Indications:    Bacteremia  History:        Patient has no prior history of Echocardiogram examinations.                 Risk Factors:Hypertension and Dyslipidemia.  Sonographer:    Irving Burton Senior RDCS Referring Phys: [phone] DAVID MANUEL ORTIZ  Sonographer Comments: Suboptimal apical window due  to lung interference. IMPRESSIONS  1. Left ventricular ejection fraction, by estimation, is 55 to 60%. The left ventricle has normal function. The left ventricle has no regional wall motion abnormalities. Left ventricular diastolic parameters were normal.  2. Right ventricular systolic function is normal. The right ventricular size is normal. There is mildly elevated pulmonary artery systolic pressure. The estimated right ventricular systolic pressure is 38.7 mmHg.  3. Left  atrial size was mildly dilated.  4. The mitral valve is normal in structure. Mild mitral valve regurgitation. No evidence of mitral stenosis.  5. The aortic valve is tricuspid. There is mild thickening of the aortic valve. Aortic valve regurgitation is moderate. No aortic stenosis is present.  6. 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  7. The inferior vena cava is normal in size with <50% respiratory variability, suggesting right atrial pressure of 8 mmHg. Comparison(s): Regurgitant valve lesions raise concern for endocarditis in setting of bacteremia, consider TEE if clinically indicated. FINDINGS  Left Ventricle: Left ventricular ejection fraction, by estimation, is 55 to 60%. The left ventricle has normal function. The left ventricle has no regional wall motion abnormalities. The left ventricular internal cavity size was normal in size. There is  borderline left ventricular hypertrophy. Left ventricular diastolic parameters were normal. Right Ventricle: The right ventricular size is normal. No increase in right ventricular wall thickness. Right ventricular systolic function is normal. There is mildly elevated pulmonary artery systolic pressure. The tricuspid regurgitant velocity is 2.77  m/s, and with an assumed right atrial pressure of 8 mmHg, the estimated right ventricular systolic pressure is 38.7 mmHg. Left Atrium: Left atrial size was mildly dilated. Right Atrium: Right atrial size was normal in size. Pericardium: There is no evidence  of pericardial effusion. Mitral Valve: The mitral valve is normal in structure. Mild mitral valve regurgitation. No evidence of mitral valve stenosis. Tricuspid Valve: The tricuspid valve is normal in structure. Tricuspid valve regurgitation is mild . No evidence of tricuspid stenosis. Aortic Valve: The aortic valve is tricuspid. There is mild thickening of the aortic valve. Aortic valve regurgitation is moderate. Aortic regurgitation PHT measures 416 msec. No aortic stenosis is present. Pulmonic Valve: The pulmonic valve was not well visualized. 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 No evidence of pulmonic stenosis. Aorta: The aortic root and ascending aorta are structurally normal, with no evidence of dilitation. Venous: The inferior vena cava is normal in size with less than 50% respiratory variability, suggesting right atrial pressure of 8 mmHg. IAS/Shunts: The atrial septum is grossly normal.  LEFT VENTRICLE PLAX 2D LVIDd:         4.90 cm   Diastology LVIDs:         3.20 cm   LV e' medial:    10.40 cm/s LV PW:         1.00 cm   LV E/e' medial:  8.0 LV IVS:        1.00 cm   LV e' lateral:   11.70 cm/s LVOT diam:     2.10 cm   LV E/e' lateral: 7.1 LV SV:         73 LV SV Index:   47 LVOT Area:     3.46 cm  RIGHT VENTRICLE RV S prime:     9.90 cm/s TAPSE (M-mode): 2.1 cm LEFT ATRIUM             Index        RIGHT ATRIUM           Index LA diam:        3.70 cm 2.37 cm/m   RA Area:     16.10 cm LA Vol (A2C):   57.8 ml 36.98 ml/m  RA Volume:   35.40 ml  22.65 ml/m LA Vol (A4C):   48.4 ml 30.96 ml/m LA Biplane Vol: 53.9 ml 34.48 ml/m  AORTIC VALVE LVOT Vmax:   97.30 cm/s LVOT Vmean:  72.600  cm/s LVOT VTI:    0.212 m AI PHT:      416 msec  AORTA Ao Root diam: 3.60 cm Ao Asc diam:  3.60 cm MITRAL VALVE               TRICUSPID VALVE MV Area (PHT): 3.42 cm    TR Peak grad:   30.7 mmHg MV Decel Time: 222 msec    TR Vmax:        277.00 cm/s MV E velocity: 83.60 cm/s MV A velocity: 78.00 cm/s  SHUNTS MV E/A ratio:   1.07        Systemic VTI:  0.21 m                            Systemic Diam: 2.10 cm Weston Brass MD Electronically signed by Weston Brass MD Signature Date/Time: 11/23/2022/5:24:19 PM    Final    CT ABDOMEN PELVIS W CONTRAST  Result Date: 11/23/2022 CLINICAL DATA:  Suspected pulmonary embolism. EXAM: CT ANGIOGRAPHY CHEST CT ABDOMEN AND PELVIS WITH CONTRAST TECHNIQUE: Multidetector CT imaging of the chest was performed using the standard protocol during bolus administration of intravenous contrast. Multiplanar CT image reconstructions and MIPs were obtained to evaluate the vascular anatomy. Multidetector CT imaging of the abdomen and pelvis was performed using the standard protocol during bolus administration of intravenous contrast. RADIATION DOSE REDUCTION: This exam was performed according to the departmental dose-optimization program which includes automated exposure control, adjustment of the mA and/or kV according to patient size and/or use of iterative reconstruction technique. CONTRAST:  80mL OMNIPAQUE IOHEXOL 350 MG/ML SOLN COMPARISON:  November 09, 2014. FINDINGS: CTA CHEST FINDINGS Cardiovascular: Satisfactory opacification of the pulmonary arteries to the segmental level. No evidence of pulmonary embolism. Normal heart size. No pericardial effusion. Coronary artery calcifications are noted. Mediastinum/Nodes: No enlarged mediastinal, hilar, or axillary lymph nodes. Thyroid gland, trachea, and esophagus demonstrate no significant findings. Lungs/Pleura: No pneumothorax or pleural effusion is noted. Mild emphysematous disease is noted. Minimal right posterior basilar subsegmental atelectasis is noted. Musculoskeletal: No chest wall abnormality. No acute or significant osseous findings. Review of the MIP images confirms the above findings. CT ABDOMEN and PELVIS FINDINGS Hepatobiliary: No focal liver abnormality is seen. No gallstones, gallbladder wall thickening, or biliary dilatation. Pancreas:  Unremarkable. No pancreatic ductal dilatation or surrounding inflammatory changes. Spleen: Normal in size without focal abnormality. Adrenals/Urinary Tract: Adrenal glands appear normal. Left renal cysts are noted for which no further follow-up is required. No hydronephrosis or renal obstruction is noted. Mild urinary bladder distention is noted. Stomach/Bowel: Stomach is within normal limits. Appendix appears normal. No evidence of bowel wall thickening, distention, or inflammatory changes. Vascular/Lymphatic: Aortic atherosclerosis. No enlarged abdominal or pelvic lymph nodes. Reproductive: Prostate is unremarkable. Other: No abdominal wall hernia or abnormality. No abdominopelvic ascites. Musculoskeletal: No acute or significant osseous findings. Review of the MIP images confirms the above findings. IMPRESSION: No definite evidence of pulmonary embolus. Mild urinary bladder distention is noted. Coronary artery calcifications are noted. Aortic Atherosclerosis (ICD10-I70.0) and Emphysema (ICD10-J43.9). Electronically Signed   By: Lupita Raider M.D.   On: 11/23/2022 15:47   CT Angio Chest Pulmonary Embolism (PE) W or WO Contrast  Result Date: 11/23/2022 CLINICAL DATA:  Suspected pulmonary embolism. EXAM: CT ANGIOGRAPHY CHEST CT ABDOMEN AND PELVIS WITH CONTRAST TECHNIQUE: Multidetector CT imaging of the chest was performed using the standard protocol during bolus administration of intravenous contrast. Multiplanar CT image reconstructions and  MIPs were obtained to evaluate the vascular anatomy. Multidetector CT imaging of the abdomen and pelvis was performed using the standard protocol during bolus administration of intravenous contrast. RADIATION DOSE REDUCTION: This exam was performed according to the departmental dose-optimization program which includes automated exposure control, adjustment of the mA and/or kV according to patient size and/or use of iterative reconstruction technique. CONTRAST:  80mL  OMNIPAQUE IOHEXOL 350 MG/ML SOLN COMPARISON:  November 09, 2014. FINDINGS: CTA CHEST FINDINGS Cardiovascular: Satisfactory opacification of the pulmonary arteries to the segmental level. No evidence of pulmonary embolism. Normal heart size. No pericardial effusion. Coronary artery calcifications are noted. Mediastinum/Nodes: No enlarged mediastinal, hilar, or axillary lymph nodes. Thyroid gland, trachea, and esophagus demonstrate no significant findings. Lungs/Pleura: No pneumothorax or pleural effusion is noted. Mild emphysematous disease is noted. Minimal right posterior basilar subsegmental atelectasis is noted. Musculoskeletal: No chest wall abnormality. No acute or significant osseous findings. Review of the MIP images confirms the above findings. CT ABDOMEN and PELVIS FINDINGS Hepatobiliary: No focal liver abnormality is seen. No gallstones, gallbladder wall thickening, or biliary dilatation. Pancreas: Unremarkable. No pancreatic ductal dilatation or surrounding inflammatory changes. Spleen: Normal in size without focal abnormality. Adrenals/Urinary Tract: Adrenal glands appear normal. Left renal cysts are noted for which no further follow-up is required. No hydronephrosis or renal obstruction is noted. Mild urinary bladder distention is noted. Stomach/Bowel: Stomach is within normal limits. Appendix appears normal. No evidence of bowel wall thickening, distention, or inflammatory changes. Vascular/Lymphatic: Aortic atherosclerosis. No enlarged abdominal or pelvic lymph nodes. Reproductive: Prostate is unremarkable. Other: No abdominal wall hernia or abnormality. No abdominopelvic ascites. Musculoskeletal: No acute or significant osseous findings. Review of the MIP images confirms the above findings. IMPRESSION: No definite evidence of pulmonary embolus. Mild urinary bladder distention is noted. Coronary artery calcifications are noted. Aortic Atherosclerosis (ICD10-I70.0) and Emphysema (ICD10-J43.9).  Electronically Signed   By: Lupita Raider M.D.   On: 11/23/2022 15:47        Scheduled Meds:  lidocaine  1 patch Transdermal Q24H   losartan  50 mg Oral Daily   Continuous Infusions:  cefTRIAXone (ROCEPHIN)  IV 2 g (11/24/22 1006)   sodium bicarbonate 150 mEq in dextrose 5 % 1,150 mL infusion 100 mL/hr at 11/25/22 0209   vancomycin            Glade Lloyd, MD [facility] 11/25/2022, 8:07 AM

## 2022-11-25 NOTE — Consult Note (Signed)
[facility]    Date of Admission:  11/23/2022   Total days of inpatient antibiotics 2        Reason for Consult: E. coli bacteremia    Principal Problem:   Bacteremia due to Escherichia coli Active Problems:   Hyperlipidemia   Essential hypertension   Chronic lung disease   Tobacco use   Coronary artery calcification   Aortic atherosclerosis (HCC)   Normocytic anemia   Epidural abscess   Assessment: 72 year old male who emigrated from [place] in 1990s presented with shoulder pain radiating to his neck and a fall admitted with:  #E. coli bacteremia with cervical abscess, bilateral shoulder septic arthritis -7/26 blood culture 2/2 E. coli, 7/26 UA was negative for nitrites/leukocytes, urine cultures with no growth. - MRI right and left shoulder showed possible septic arthritis at [place] femoral joint - Minimal bilateral midlung opacities noted on chest x-ray.  CT chest showed mild emphysematous disease, no definitive evidence of PE. - CT abdomen pelvis was benign.  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  Neurosurgery has been engaged. - Etiology of bacteremia is unclear.  Patient has had no recent GI procedure, a benign UA.  He daughter at bedside did at church last Sunday.  In the evening of this event patient started to feel ill, eating less, less energy.  She states prior to hospitalization he started hallucinating. Recommendations:  -Discontinue vancomycin - Continue ceftriaxone - Follow-up blood cultures from 7/26 - Neurosurgery engaged plan on transfer to [facility] for OR.  Please obtain cultures - Ortho engaged per septic arthritis, noted that clinical exam/imaging not consistent with septic process.  Do not think aspiration is required. - Will contact lab tomorrow to see if the E. coli can be further identified in case this is  due to foodborne etiology. -TTE did not show vegetation, noted that TEE was arranged by primary communicated with cardiology on 7//28.  Postponed as epidural abscess found on imaging.  Of note endocarditis secondary to E. coli is rare but given that he has summing foci of infection including bilateral shoulders and neck, TEE is warranted.  #History of tobacco abuse #Emphysema based on imaging  Microbiology:   Antibiotics: Vancomycin and ceftriaxone  Cultures: Blood 7/26 E. coli Urine 7/26 no growth Other   HPI: Frederick Andrews is a 72 y.o. male with past medical history of hyperlipidemia, hypertension, tobacco abuse, chronic lung disease, osteoarthritis of the knee return to the ED due to positive blood cultures.  Noted that day before admission he had left shoulder pain after having a fall.  Pain was radiating to his neck.  Also noted low-grade temperatures.  On arrival to the ED, vital stable WBC 9.6K.   MRI cervical spine showed epidural abscess.,  Bilateral shoulder MRI showed possible septic arthritis glenohumeral joint.  ID engaged for further management.  Review of Systems: Review of Systems  All other systems reviewed and are negative.   Past Medical History:  Diagnosis Date   Hyperlipemia    Hypertension     Social History   Tobacco Use   Smoking status: Every Day    Current packs/day: 1.00    Average packs/day: 1 pack/day for 40.0 years (40.0 ttl pk-yrs)    Types: Cigarettes   Tobacco comments:    Working on decreasing the number of cigarettes per day.  Substance Use Topics   Alcohol use: No    Alcohol/week: 0.0 standard drinks of alcohol   Drug use: No    Family History  Problem Relation Age of Onset   Heart disease Mother    Hypertension Mother    Hyperlipidemia Mother    Hyperlipidemia Other    Scheduled Meds:  lidocaine  1 patch Transdermal Q24H   losartan  50 mg Oral Daily   Continuous Infusions:  cefTRIAXone (ROCEPHIN)  IV 2 g (11/25/22 0911)    dextrose 5 % and 0.9 % NaCl with KCl 40 mEq/L 75 mL/hr at 11/25/22 1249   PRN Meds:.acetaminophen **OR** acetaminophen, alum & mag hydroxide-simeth **AND** lidocaine, hydrALAZINE, ipratropium-albuterol, ketorolac, labetalol, methocarbamol, nicotine, ondansetron **OR** ondansetron (ZOFRAN) IV, oxyCODONE, phenol No Known Allergies  OBJECTIVE: Blood pressure (!) 155/65, pulse 65, temperature 99 F (37.2 C), temperature source Oral, resp. rate 18, height 5\' 3"  (1.6 m), weight 55 kg, SpO2 93%.  Physical Exam Constitutional:      General: He is not in acute distress.    Appearance: He is normal weight. He is not toxic-appearing.  HENT:     Head: Normocephalic and atraumatic.     Right Ear: External ear normal.     Left Ear: External ear normal.     Nose: No congestion or rhinorrhea.     Mouth/Throat:     Mouth: Mucous membranes are moist.     Pharynx: Oropharynx is clear.  Eyes:     Extraocular Movements: Extraocular movements intact.     Conjunctiva/sclera: Conjunctivae normal.     Pupils: Pupils are equal, round, and reactive to light.  Cardiovascular:     Rate and Rhythm: Normal rate and regular rhythm.     Heart sounds: No murmur heard.    No friction rub. No gallop.  Pulmonary:     Effort: Pulmonary effort is normal.     Breath sounds: Normal breath sounds.  Abdominal:     General: Abdomen is flat. Bowel sounds are normal.     Palpations: Abdomen is soft.  Musculoskeletal:        General: No swelling. Normal range of motion.     Cervical back: Normal range of motion and neck supple.  Skin:    General: Skin is warm and dry.  Neurological:     General: No focal deficit present.     Mental Status: He is oriented to person, place, and time.  Psychiatric:        Mood and Affect: Mood normal.     Lab Results Lab Results  Component Value Date   WBC 9.3 11/25/2022   HGB 11.0 (L) 11/25/2022   HCT 32.2 (L) 11/25/2022   MCV 84.1 11/25/2022   PLT 145 (L) 11/25/2022    Lab  Results  Component Value Date   CREATININE 0.76 11/25/2022   BUN 22 11/25/2022   NA 136 11/25/2022   K 3.1 (L) 11/25/2022   CL 104 11/25/2022   CO2 24 11/25/2022    Lab Results  Component Value Date   ALT 28 11/25/2022   AST 26 11/25/2022   ALKPHOS 238 (H) 11/25/2022   BILITOT 1.0 11/25/2022       Danelle Earthly, MD [facility] 11/25/2022, 10:35 PM   I have personally spent 82 minutes involved in face-to-face and non-face-to-face activities for this patient on the day of the visit. Professional time spent includes the following activities: Preparing to see the patient (review  of tests), Obtaining and/or reviewing separately obtained history (admission/discharge record), Performing a medically appropriate examination and/or evaluation , Ordering medications/tests/procedures, referring and communicating with other health care professionals, Documenting clinical information in the EMR, Independently interpreting results (not separately reported), Communicating results to the patient/family/caregiver, Counseling and educating the patient/family/caregiver and Care coordination (not separately reported).

## 2022-11-26 ENCOUNTER — Telehealth (HOSPITAL_BASED_OUTPATIENT_CLINIC_OR_DEPARTMENT_OTHER): Payer: Self-pay | Admitting: *Deleted

## 2022-11-26 ENCOUNTER — Encounter (HOSPITAL_COMMUNITY)
Admission: EM | Disposition: A | Discharge status: Home Health | Payer: Self-pay | Source: Home / Self Care | Attending: Internal Medicine

## 2022-11-26 ENCOUNTER — Other Ambulatory Visit: Payer: Self-pay

## 2022-11-26 ENCOUNTER — Inpatient Hospital Stay (HOSPITAL_COMMUNITY): Payer: 59 | Admitting: Certified Registered Nurse Anesthetist

## 2022-11-26 ENCOUNTER — Encounter (HOSPITAL_COMMUNITY): Payer: Self-pay | Admitting: Internal Medicine

## 2022-11-26 ENCOUNTER — Inpatient Hospital Stay (HOSPITAL_COMMUNITY): Payer: 59

## 2022-11-26 DIAGNOSIS — G061 Intraspinal abscess and granuloma: Secondary | ICD-10-CM

## 2022-11-26 DIAGNOSIS — I10 Essential (primary) hypertension: Secondary | ICD-10-CM

## 2022-11-26 DIAGNOSIS — I2510 Atherosclerotic heart disease of native coronary artery without angina pectoris: Secondary | ICD-10-CM

## 2022-11-26 DIAGNOSIS — F17210 Nicotine dependence, cigarettes, uncomplicated: Secondary | ICD-10-CM | POA: Diagnosis not present

## 2022-11-26 DIAGNOSIS — R7881 Bacteremia: Secondary | ICD-10-CM | POA: Diagnosis not present

## 2022-11-26 DIAGNOSIS — B9620 Unspecified Escherichia coli [E. coli] as the cause of diseases classified elsewhere: Secondary | ICD-10-CM | POA: Diagnosis not present

## 2022-11-26 LAB — AEROBIC/ANAEROBIC CULTURE W GRAM STAIN (SURGICAL/DEEP WOUND): Gram Stain: NONE SEEN

## 2022-11-26 SURGERY — ANTERIOR CERVICAL DECOMPRESSION/DISCECTOMY FUSION 2 LEVELS
Anesthesia: General

## 2022-11-26 MED ORDER — POLYETHYLENE GLYCOL 3350 17 G PO PACK: 17.0000 g | PACK | Freq: Every day | ORAL | Status: DC | PRN

## 2022-11-26 MED ORDER — FENTANYL CITRATE (PF) 100 MCG/2ML IJ SOLN
25.0000 ug | INTRAMUSCULAR | Status: DC | PRN
  Administered 2022-11-26 (×2): 50 ug via INTRAVENOUS

## 2022-11-26 MED ORDER — MIDAZOLAM HCL 5 MG/5ML IJ SOLN
INTRAMUSCULAR | Status: DC | PRN
  Administered 2022-11-26: 2 mg via INTRAVENOUS

## 2022-11-26 MED ORDER — LABETALOL HCL 5 MG/ML IV SOLN
INTRAVENOUS | Status: AC
  Filled 2022-11-26: qty 4

## 2022-11-26 MED ORDER — BISACODYL 10 MG RE SUPP: 10.0000 mg | Freq: Every day | RECTAL | Status: DC | PRN

## 2022-11-26 MED ORDER — SODIUM CHLORIDE 0.9% FLUSH: 3.0000 mL | INTRAVENOUS | Status: DC | PRN

## 2022-11-26 MED ORDER — BUPIVACAINE HCL (PF) 0.5 % IJ SOLN
INTRAMUSCULAR | Status: DC | PRN
  Administered 2022-11-26: 1.5 mL

## 2022-11-26 MED ORDER — LIDOCAINE-EPINEPHRINE 1 %-1:100000 IJ SOLN
INTRAMUSCULAR | Status: DC | PRN
  Administered 2022-11-26: 1.5 mL via INTRADERMAL

## 2022-11-26 MED ORDER — ONDANSETRON HCL 4 MG/2ML IJ SOLN: 4.0000 mg | Freq: Four times a day (QID) | INTRAMUSCULAR | Status: DC | PRN

## 2022-11-26 MED ORDER — THROMBIN 5000 UNITS EX SOLR: OROMUCOSAL | Status: DC | PRN

## 2022-11-26 MED ORDER — ARTIFICIAL TEARS OPHTHALMIC OINT
TOPICAL_OINTMENT | OPHTHALMIC | Status: DC | PRN
  Administered 2022-11-26: 1 via OPHTHALMIC

## 2022-11-26 MED ORDER — FENTANYL CITRATE (PF) 100 MCG/2ML IJ SOLN
INTRAMUSCULAR | Status: AC
  Filled 2022-11-26: qty 2

## 2022-11-26 MED ORDER — ONDANSETRON HCL 4 MG/2ML IJ SOLN
INTRAMUSCULAR | Status: DC | PRN
  Administered 2022-11-26: 4 mg via INTRAVENOUS

## 2022-11-26 MED ORDER — LACTATED RINGERS IV SOLN
INTRAVENOUS | Status: DC | PRN
Start: 2022-11-26 — End: 2022-11-26

## 2022-11-26 MED ORDER — ESMOLOL HCL 100 MG/10ML IV SOLN
INTRAVENOUS | Status: DC | PRN
  Administered 2022-11-26: 10 mg via INTRAVENOUS

## 2022-11-26 MED ORDER — PROPOFOL 10 MG/ML IV BOLUS
INTRAVENOUS | Status: DC | PRN
Start: 2022-11-26 — End: 2022-11-26
  Administered 2022-11-26: 140 mg via INTRAVENOUS

## 2022-11-26 MED ORDER — FENTANYL CITRATE (PF) 100 MCG/2ML IJ SOLN
INTRAMUSCULAR | Status: DC | PRN
  Administered 2022-11-26 (×3): 50 ug via INTRAVENOUS
  Administered 2022-11-26 (×2): 25 ug via INTRAVENOUS
  Administered 2022-11-26: 50 ug via INTRAVENOUS

## 2022-11-26 MED ORDER — SODIUM CHLORIDE 0.9 % IV SOLN
250.0000 mL | INTRAVENOUS | Status: DC
  Administered 2022-11-26: 250 mL via INTRAVENOUS

## 2022-11-26 MED ORDER — ROCURONIUM BROMIDE 100 MG/10ML IV SOLN
INTRAVENOUS | Status: DC | PRN
  Administered 2022-11-26: 20 mg via INTRAVENOUS
  Administered 2022-11-26: 50 mg via INTRAVENOUS

## 2022-11-26 MED ORDER — FLEET ENEMA 7-19 GM/118ML RE ENEM: 1.0000 | ENEMA | Freq: Once | RECTAL | Status: DC | PRN

## 2022-11-26 MED ORDER — DOCUSATE SODIUM 100 MG PO CAPS
100.0000 mg | ORAL_CAPSULE | Freq: Two times a day (BID) | ORAL | Status: DC
  Administered 2022-11-26 – 2022-11-27 (×4): 100 mg via ORAL
  Filled 2022-11-26 (×5): qty 1

## 2022-11-26 MED ORDER — LIDOCAINE HCL (CARDIAC) PF 50 MG/5ML IV SOSY
PREFILLED_SYRINGE | INTRAVENOUS | Status: DC | PRN
  Administered 2022-11-26: 60 mg via INTRAVENOUS

## 2022-11-26 MED ORDER — CHLORHEXIDINE GLUCONATE CLOTH 2 % EX PADS: 6.0000 | MEDICATED_PAD | Freq: Every day | CUTANEOUS | Status: DC

## 2022-11-26 MED ORDER — SUGAMMADEX SODIUM 200 MG/2ML IV SOLN
INTRAVENOUS | Status: DC | PRN
  Administered 2022-11-26: 200 mg via INTRAVENOUS

## 2022-11-26 MED ORDER — TAMSULOSIN HCL 0.4 MG PO CAPS
0.4000 mg | ORAL_CAPSULE | Freq: Every day | ORAL | Status: DC
  Administered 2022-11-26 – 2022-11-29 (×4): 0.4 mg via ORAL
  Filled 2022-11-26 (×4): qty 1

## 2022-11-26 MED ORDER — PHENOL 1.4 % MT LIQD
1.0000 | OROMUCOSAL | Status: DC | PRN
  Administered 2022-11-27: 1 via OROMUCOSAL

## 2022-11-26 MED ORDER — SUCCINYLCHOLINE CHLORIDE 20 MG/ML IJ SOLN
INTRAMUSCULAR | Status: DC | PRN
  Administered 2022-11-26: 120 mg via INTRAVENOUS

## 2022-11-26 MED ORDER — ACETAMINOPHEN 325 MG PO TABS
650.0000 mg | ORAL_TABLET | ORAL | Status: DC | PRN
  Administered 2022-11-26 – 2022-11-29 (×5): 650 mg via ORAL
  Filled 2022-11-26 (×5): qty 2

## 2022-11-26 MED ORDER — MENTHOL 3 MG MT LOZG: 1.0000 | LOZENGE | OROMUCOSAL | Status: DC | PRN

## 2022-11-26 MED ORDER — POLYETHYLENE GLYCOL 3350 17 G PO PACK
17.0000 g | PACK | Freq: Every day | ORAL | Status: DC
  Administered 2022-11-26 – 2022-11-29 (×3): 17 g via ORAL
  Filled 2022-11-26 (×4): qty 1

## 2022-11-26 MED ORDER — CEFAZOLIN SODIUM-DEXTROSE 2-4 GM/100ML-% IV SOLN
2.0000 g | Freq: Three times a day (TID) | INTRAVENOUS | Status: AC
  Administered 2022-11-26 (×2): 2 g via INTRAVENOUS
  Filled 2022-11-26 (×2): qty 100

## 2022-11-26 MED ORDER — 0.9 % SODIUM CHLORIDE (POUR BTL) OPTIME
TOPICAL | Status: DC | PRN
  Administered 2022-11-26: 1000 mL

## 2022-11-26 MED ORDER — LABETALOL HCL 5 MG/ML IV SOLN
5.0000 mg | Freq: Once | INTRAVENOUS | Status: AC
  Administered 2022-11-26: 5 mg via INTRAVENOUS

## 2022-11-26 MED ORDER — ACETAMINOPHEN 650 MG RE SUPP: 650.0000 mg | RECTAL | Status: DC | PRN

## 2022-11-26 MED ORDER — SODIUM CHLORIDE 0.9% FLUSH
3.0000 mL | Freq: Two times a day (BID) | INTRAVENOUS | Status: DC
  Administered 2022-11-26 – 2022-11-27 (×2): 3 mL via INTRAVENOUS

## 2022-11-26 MED ORDER — SENNA 8.6 MG PO TABS
1.0000 | ORAL_TABLET | Freq: Two times a day (BID) | ORAL | Status: DC
  Administered 2022-11-26 – 2022-11-29 (×7): 8.6 mg via ORAL
  Filled 2022-11-26 (×7): qty 1

## 2022-11-26 MED ORDER — ONDANSETRON HCL 4 MG PO TABS: 4.0000 mg | ORAL_TABLET | Freq: Four times a day (QID) | ORAL | Status: DC | PRN

## 2022-11-26 MED ORDER — MUPIROCIN 2 % EX OINT
1.0000 | TOPICAL_OINTMENT | Freq: Two times a day (BID) | CUTANEOUS | Status: DC
  Administered 2022-11-26 – 2022-11-29 (×7): 1 via NASAL
  Filled 2022-11-26 (×2): qty 22

## 2022-11-26 NOTE — Telephone Encounter (Signed)
Post ED Visit - Positive Culture Follow-up  Culture report reviewed by antimicrobial stewardship pharmacist: [facility] []  Enzo Bi, Pharm.D. []  Celedonio Miyamoto, Pharm.D., BCPS AQ-ID []  Garvin Fila, Pharm.D., BCPS []  Georgina Pillion, [address].D., BCPS []  [place], [address].D., BCPS, AAHIVP []  Estella Husk, Pharm.D., BCPS, AAHIVP []  Lysle Pearl, PharmD, BCPS []  Phillips Climes, PharmD, BCPS []  Agapito Games, PharmD, BCPS []  Verlan Friends, PharmD []  Mervyn Gay, PharmD, BCPS []  Vinnie Level, PharmD  [facility] []  Georgina Pillion, PharmD []  Greer Pickerel, PharmD []  Adalberto Cole, PharmD []  Perlie Gold, Rph []  Lonell Face) Jean Rosenthal, PharmD []  Earl Many, PharmD []  Junita Push, PharmD []  Dorna Leitz, PharmD []  Terrilee Files, PharmD []  Lynann Beaver, PharmD []  Keturah Barre, PharmD []  Loralee Pacas, PharmD []  Bernadene Person, PharmD   Positive Blood culture Patient admitted to [facility].  Frederick Andrews [facility] 11/26/2022, 10:19 AM

## 2022-11-26 NOTE — Care Management Important Message (Signed)
Important Message  Patient Details  Name: Elrick Benham MRN: [id number] Date of Birth: 1951-04-07   Medicare Important Message Given:  Yes      Stefan Church 11/26/2022, 2:56 PM

## 2022-11-26 NOTE — Anesthesia Procedure Notes (Addendum)
Procedure Name: Intubation Date/Time: 11/26/2022 12:41 AM  Performed by: Edmonia Caprio, CRNAPre-anesthesia Checklist: Emergency Drugs available, Patient identified, Suction available, Patient being monitored and Timeout performed Patient Re-evaluated:Patient Re-evaluated prior to induction Oxygen Delivery Method: Circle system utilized Preoxygenation: Pre-oxygenation with 100% oxygen Induction Type: IV induction and Rapid sequence Laryngoscope Size: Glidescope and 3 Grade View: Grade II Tube size: 7.5 mm Number of attempts: 1 Airway Equipment and Method: Stylet and Video-laryngoscopy Placement Confirmation: ETT inserted through vocal cords under direct vision, positive ETCO2 and breath sounds checked- equal and bilateral Secured at: 21 cm Tube secured with: Tape Dental Injury: Teeth and Oropharynx as per pre-operative assessment  Difficulty Due To: Difficult Airway-  due to neck instability Comments: Neck neutral for induction and intubation

## 2022-11-26 NOTE — Evaluation (Signed)
Physical Therapy Evaluation Patient Details Name: Frederick Andrews MRN: [id number] DOB: 1950-08-28 Today's Date: 11/26/2022  History of Present Illness  Patient is 72 y.o. male s/p ACDF C4-6 on 11/26/22 secondary to epidural abscess and cord compression with weakness in bil Ue's. PMH significant for HTN, HLD.   Clinical Impression  Frederick Andrews is 72 y.o. male admitted with above HPI and diagnosis. Patient is currently limited by functional impairments below (see PT problem list). Patient lives with spouse and is independent at baseline. Educated pt on cervical spine precautions and pt demonstrated safe technique of log roll and understanding of precautions. Pt completed sit<>stand at independent level and gait with no AD and min guard fading to supervision for safety. Patient will benefit from continued skilled PT interventions to address impairments and progress independence with mobility. Acute PT will follow and progress as able.         If plan is discharge home, recommend the following:     Can travel by private vehicle        Equipment Recommendations None recommended by PT  Recommendations for Other Services       Functional Status Assessment Patient has had a recent decline in their functional status and demonstrates the ability to make significant improvements in function in a reasonable and predictable amount of time.     Precautions / Restrictions Precautions Precautions: Cervical Precaution Booklet Issued: Yes (comment) Precaution Comments: no brace needed Cervical Brace: Other (comment) (No brace needed per order) Restrictions Weight Bearing Restrictions: No      Mobility  Bed Mobility Overal bed mobility: Independent             General bed mobility comments: pt educated on log roll technique and completed at independent level.    Transfers Overall transfer level: Independent                 General transfer comment: independent with rise and lower from  EOB    Ambulation/Gait Ambulation/Gait assistance: Supervision Gait Distance (Feet): 300 Feet Assistive device: None   Gait velocity: fair        Stairs Stairs: Yes Stairs assistance: Min guard Stair Management: Two rails, Forwards, Alternating pattern Number of Stairs: 3 General stair comments: no LOB noted, pt steady with bil hand rails, guarding for safety only.  Wheelchair Mobility     Tilt Bed    Modified Rankin (Stroke Patients Only)       Balance Overall balance assessment: Needs assistance Sitting-balance support: Feet supported Sitting balance-Leahy Scale: Good     Standing balance support: During functional activity, No upper extremity supported Standing balance-Leahy Scale: Good                               Pertinent Vitals/Pain Pain Assessment Pain Assessment: No/denies pain    Home Living Family/patient expects to be discharged to:: Private residence Living Arrangements: Spouse/significant other;Children Available Help at Discharge: Family Type of Home: House Home Access: Stairs to enter Entrance Stairs-Rails: Left Entrance Stairs-Number of Steps: 3   Home Layout: One level Home Equipment: None      Prior Function Prior Level of Function : Independent/Modified Independent;Driving                     Hand Dominance   Dominant Hand: Right    Extremity/Trunk Assessment   Upper Extremity Assessment Upper Extremity Assessment: Overall WFL for tasks assessed  Lower Extremity Assessment Lower Extremity Assessment: Overall WFL for tasks assessed    Cervical / Trunk Assessment Cervical / Trunk Assessment: Neck Surgery  Communication   Communication: No difficulties  Cognition Arousal/Alertness: Awake/alert Behavior During Therapy: WFL for tasks assessed/performed Overall Cognitive Status: Within Functional Limits for tasks assessed                                          General Comments       Exercises     Assessment/Plan    PT Assessment Patient needs continued PT services  PT Problem List Decreased activity tolerance;Decreased mobility;Decreased knowledge of precautions;Decreased strength       PT Treatment Interventions DME instruction;Gait training;Stair training;Functional mobility training;Therapeutic activities;Therapeutic exercise;Balance training;Neuromuscular re-education;Cognitive remediation;Patient/family education    PT Goals (Current goals can be found in the Care Plan section)  Acute Rehab PT Goals Patient Stated Goal: not have pain, get better PT Goal Formulation: All assessment and education complete, DC therapy Time For Goal Achievement: 12/03/22 Potential to Achieve Goals: Good    Frequency Min 1X/week     Co-evaluation               AM-PAC PT "6 Clicks" Mobility  Outcome Measure Help needed turning from your back to your side while in a flat bed without using bedrails?: None Help needed moving from lying on your back to sitting on the side of a flat bed without using bedrails?: None Help needed moving to and from a bed to a chair (including a wheelchair)?: None Help needed standing up from a chair using your arms (e.g., wheelchair or bedside chair)?: None Help needed to walk in hospital room?: A Little Help needed climbing 3-5 steps with a railing? : A Little 6 Click Score: 22    End of Session Equipment Utilized During Treatment: Gait belt Activity Tolerance: Patient tolerated treatment well Patient left: in bed;with call bell/phone within reach;with family/visitor present Nurse Communication: Mobility status PT Visit Diagnosis: Other abnormalities of gait and mobility (R26.89);Other symptoms and signs involving the nervous system (R29.898);Difficulty in walking, not elsewhere classified (R26.2)    Time: 1610-9604 PT Time Calculation (min) (ACUTE ONLY): 26 min   Charges:   PT Evaluation $PT Eval Low Complexity: 1 Low PT  Treatments $Gait Training: 8-22 mins PT General Charges $$ ACUTE PT VISIT: 1 Visit         Wynn Maudlin, DPT Acute Rehabilitation Services Office [phone]  11/26/22 9:51 AM

## 2022-11-26 NOTE — Evaluation (Signed)
Occupational Therapy Evaluation/Discharge Patient Details Name: Frederick Andrews MRN: [id number] DOB: 13-Dec-1950 Today's Date: 11/26/2022   History of Present Illness Patient is 72 y.o. male s/p ACDF C4-6 on 11/26/22 secondary to epidural abscess and cord compression with weakness in bil Ue's. PMH significant for HTN, HLD.   Clinical Impression   PTA, pt lives with family and typically completely independent without AD. Pt presents now with reported significant improvements in pain. Educated re: cervical precautions with ADLs, bed mobility and general body mechanics w/ minor cues needed to adhere but overall no physical assist needed. Pt's family present, report they are able to help with heavier IADL tasks at DC. No further skilled OT services needed at the acute level or on DC.      Recommendations for follow up therapy are one component of a multi-disciplinary discharge planning process, led by the attending physician.  Recommendations may be updated based on patient status, additional functional criteria and insurance authorization.   Assistance Recommended at Discharge PRN  Patient can return home with the following Assist for transportation;Assistance with cooking/housework    Functional Status Assessment  Patient has not had a recent decline in their functional status  Equipment Recommendations  None recommended by OT    Recommendations for Other Services       Precautions / Restrictions Precautions Precautions: Cervical Precaution Booklet Issued: Yes (comment) Precaution Comments: no brace needed Cervical Brace: Other (comment) (No brace needed per order) Restrictions Weight Bearing Restrictions: No      Mobility Bed Mobility Overal bed mobility: Independent             General bed mobility comments: pt educated on log roll technique and completed at independent level.    Transfers Overall transfer level: Independent                 General transfer comment:  independent with rise and lower from EOB      Balance Overall balance assessment: Needs assistance Sitting-balance support: Feet supported Sitting balance-Leahy Scale: Good     Standing balance support: During functional activity, No upper extremity supported Standing balance-Leahy Scale: Good                             ADL either performed or assessed with clinical judgement   ADL Overall ADL's : Modified independent                                       General ADL Comments: Educated re: cervical precautions for LB dressing with pt able to demo good flexibility to cross LE and reach feet without bending, discussed other bathing/showering options to minimize bending, how to pick up items from floor and avoiding heavy lifting.     Vision Ability to See in Adequate Light: 0 Adequate Patient Visual Report: No change from baseline Vision Assessment?: No apparent visual deficits     Perception     Praxis      Pertinent Vitals/Pain Pain Assessment Pain Assessment: No/denies pain     Hand Dominance Right   Extremity/Trunk Assessment Upper Extremity Assessment Upper Extremity Assessment: Overall WFL for tasks assessed (4/5 strength)   Lower Extremity Assessment Lower Extremity Assessment: Defer to PT evaluation   Cervical / Trunk Assessment Cervical / Trunk Assessment: Neck Surgery   Communication Communication Communication: No difficulties   Cognition Arousal/Alertness: Awake/alert Behavior  During Therapy: WFL for tasks assessed/performed Overall Cognitive Status: Within Functional Limits for tasks assessed                                       General Comments  Daughter present with pt/family reporting preference to translate themselves during session    Exercises     Shoulder Instructions      Home Living Family/patient expects to be discharged to:: Private residence Living Arrangements: Spouse/significant  other;Children Available Help at Discharge: Family Type of Home: House Home Access: Stairs to enter Secretary/administrator of Steps: 3 Entrance Stairs-Rails: Left Home Layout: One level     Bathroom Shower/Tub: Chief Strategy Officer: Standard Bathroom Accessibility: Yes   Home Equipment: None          Prior Functioning/Environment Prior Level of Function : Independent/Modified Independent;Driving             Mobility Comments: no AD ADLs Comments: does not use shower head in tub, uses bucket/sponge bathing this way. Indep with ADLs        OT Problem List:        OT Treatment/Interventions:      OT Goals(Current goals can be found in the care plan section) Acute Rehab OT Goals Patient Stated Goal: thankful that pain has resolved, home soon OT Goal Formulation: All assessment and education complete, DC therapy  OT Frequency:      Co-evaluation              AM-PAC OT "6 Clicks" Daily Activity     Outcome Measure Help from another person eating meals?: None Help from another person taking care of personal grooming?: None Help from another person toileting, which includes using toliet, bedpan, or urinal?: None Help from another person bathing (including washing, rinsing, drying)?: None Help from another person to put on and taking off regular upper body clothing?: None Help from another person to put on and taking off regular lower body clothing?: None 6 Click Score: 24   End of Session    Activity Tolerance: Patient tolerated treatment well Patient left: in bed;with call bell/phone within reach;with family/visitor present  OT Visit Diagnosis: Other abnormalities of gait and mobility (R26.89)                Time: 1610-9604 OT Time Calculation (min): 15 min Charges:  OT General Charges $OT Visit: 1 Visit OT Evaluation $OT Eval Low Complexity: 1 Low  Bradd Canary, OTR/L Acute Rehab Services Office: [phone]   Lorre Munroe 11/26/2022,  11:16 AM

## 2022-11-26 NOTE — Plan of Care (Signed)
  Problem: Education: Goal: Knowledge of General Education information will improve Description Including pain rating scale, medication(s)/side effects and non-pharmacologic comfort measures Outcome: Progressing   

## 2022-11-26 NOTE — Progress Notes (Signed)
Patient ID: Frederick Andrews, male   DOB: March 19, 1951, 72 y.o.   MRN: [id number] Health signs are stable patient is awake and alert he notes that there is less pain in his neck and shoulders.  His right deltoid still remains weak and 4- out of 5 but through his interpreter he says that this is from an old war injury in [place].  Incision is clean and dry on his neck.  He is swallowing well and is quite pleased this early postoperatively.  I have sent considerable tissue for culture and sensitivity as there was gross pus at the level of C4-C5 swab cultures were also obtained from this area.  I am hopeful this will help speed his recovery.  Daughter asked questions regarding length and duration of antibiotic therapy which I defer to guidance from infectious disease.  He can be ambulated as tolerated.

## 2022-11-26 NOTE — Op Note (Signed)
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 Postoperative diagnosis: Same Procedure: Anterior cervical decompression C4-5 C5-6 arthrodesis with structural fixation and allograft anterior plate fixation [id number]. Surgeon: Barnett Abu Anesthesia: General endotracheal Indications: Frederick Andrews is a 72 year old male who has had profound cervical epidural abscess with cord compression and weakness in his upper extremities.  He was advised regarding the need for surgery to decompress and stabilize his cervical spine.  Procedure: The patient was brought to the operating room supine on the stretcher.  After the smooth induction of general endotracheal anesthesia, he was placed in 5 pounds of halter traction.  The neck was prepped with alcohol DuraPrep and draped in a sterile fashion.  Transverse incision was made on the left-sided neck and this was carried down through the platysma.  The plane between the sternocleidomastoid and strap muscles dissected bluntly into the prevertebral space was reached.  First identifiable disc space was noted to be that of C6 7-second radiographs identified C5-6.  Longus coli muscle was stripped off via the side of midline and a self-retaining Caspar style retractor was placed in this area.  The disc base was opened ventrally at C5-6 and was noted to be markedly edematous.  The disc was evacuated was noted to be edematous though no frank pus was noted at C5-6 in the epidural space there was noted to be some phlegmon in his tissue that was sent for culture and specimen.  The C5-6 area was decompressed from left to right and all the phlegmon was removed.  The patient endplates were shaved smoothed and when verified a 6 mm x 15 mm x 12 mm spacer was fitted into the vertebral space after being filled with demineralized bone matrix.  Attention was turned to C4-5 here in the prevertebral space there was frank pus this was sent for  culture tissue was also sent for culture and the disc space was noted to be much more edematous and had pus within it.  This was evacuated and also sent for cultures and the posterior longitudinal ligament was eventually reached and opened and again pus exuded cell from this area once all the area was decompressed and the lateral recesses were decompressed to decompress the C5 nerve roots bilaterally the area was irrigated copiously the endplates were shaved smoothed and again a 6 mm spacer by 15 x 12 with 7 degrees lordosis was fitted into the vertebral space with demineralized bone matrix being packed within it.  Then a 36 mm standard size ACP plate was fitted to the ventral aspect of the vertebral bodies and secured with six 3-1/2 x 15 mm variable angle screws.  Final radiograph was obtained which stated identified good position of the hardware.  Hemostasis was carefully checked in the soft tissues and then the platysma was closed with 4-0 Vicryl in interrupted fashion and 4-0 Vicryl was used in the subcuticular tissues Dermabond was placed on the skin and blood loss for the procedure was estimated about 50 cc.

## 2022-11-26 NOTE — Transfer of Care (Signed)
Immediate Anesthesia Transfer of Care Note  Patient: Frederick Andrews  Procedure(s) Performed: ANTERIOR CERVICAL DECOMPRESSION FUSION FOR EPIDURAL ABSCESS CERVICAL FOUR-FIVE AND CERVICAL FIVE-SIX (Neck)  Patient Location: PACU  Anesthesia Type:General  Level of Consciousness: awake and alert   Airway & Oxygen Therapy: Patient Spontanous Breathing  Post-op Assessment: Report given to RN and Post -op Vital signs reviewed and stable  Post vital signs: Reviewed and stable  Last Vitals:  Vitals Value Taken Time  BP 107/93 11/26/22 0230  Temp 37 C 11/26/22 0220  Pulse 136 11/26/22 0233  Resp 16 11/26/22 0233  SpO2 92 % 11/26/22 0233  Vitals shown include unfiled device data.  Last Pain:  Vitals:   11/25/22 1938  TempSrc: Oral  PainSc:       Patients Stated Pain Goal: 3 (11/25/22 0900)  Complications: No notable events documented.

## 2022-11-26 NOTE — Progress Notes (Signed)
PROGRESS NOTE  Zeev Wiant  YQM:[id number] DOB: 12-Nov-1950 DOA: 11/23/2022 PCP: Patient, No Pcp Per   Brief Narrative: Patient is a 72 year old male with history of hyperlipidemia, hypertension, tobacco use who presented with positive blood cultures.  Patient presented to ED 1 day prior to admission with left shoulder pain after having a fall.  Blood cultures were obtained at the time because he had low-grade temperatures.  Blood cultures subsequently showed gram-negative rods and he was recalled to the ED.  On presentation,he did not have any leukocytosis.  CT chest did not show any PE.  CT abdomen/pelvis showed mild urinary distention, CT head was negative for acute intracranial findings.  CT cervical spine showed multilevel cervical spondylosis.  Patient was admitted for sepsis/bacteremia and was started on IV antibiotics.  Subsequent MRI of the cervical found showed discitis/osteomyelitis at C4-5 with ventral epidural abscesses at the C5 level measuring 6 x 2 x 12 mm on the right and 4 x 2 x 10 mm on the left.  Patient urgently transferred to a [facility] for neurosurgery.  Underwent anterior cervical decompression C4-C5, C5-C6 arthrodesis with distal stable fixation and allograft anterior plate fixation of C4-C6 by Dr. Danielle Dess.  Blood cultures positive for E. coli.  ID also following.  Assessment & Plan:  Principal Problem:   Bacteremia due to Escherichia coli Active Problems:   Hyperlipidemia   Essential hypertension   Chronic lung disease   Tobacco use   Coronary artery calcification   Aortic atherosclerosis (HCC)   Normocytic anemia   Epidural abscess  Cervical discitis/osteomyelitis with epidural abscess/E. coli bacteremia: Scenario as above. Patient was admitted for sepsis/bacteremia and was started on IV antibiotics.  Subsequent MRI of the cervical found showed discitis/osteomyelitis at C4-5 with ventral epidural abscesses at the C5 level measuring 6 x 2 x 12 mm on the right  and 4 x 2 x 10 mm on the left.  Patient urgently transferred to a [facility] hospital for [facility]surgery.  Underwent anterior cervical decompression C4-C5, C5-C6 arthrodesis with distal stable fixation and allograft anterior plate fixation of C4-C6 by Dr. Danielle Dess.  Blood cultures positive for E. coli.  ID also following.  Elevated CRP.  Has mild leukocytosis.On ceftraxone.  Follow-up wound culture report. PT/OT evaluation requested and following.  He ambulated very well without any assistance this morning  Severe neck/bilateral shoulder pain: Orthopedics was consulted.  No evidence of septic arthritis but his symptoms are possibly due to severe rotator cuff pathology and glenohumeral arthritis.  No need of surgical intervention as per orthopedics.  Continue pain management and support care  Normocytic anemia: Currently hemoglobin stable.  Thrombocytopenia: Most likely secondary to sepsis.  Continue to monitor.  Hypertension/sinus tachycardia: Much improved now.  Continue to monitor.On losartan  at home, restarted  History of hyperlipidemia: On Lipitor  Smoker: on nicotene patch  Constipation: Continue bowel regimen          DVT prophylaxis:SCD's Start: 11/26/22 0514     Code Status: Full Code  Family Communication: Daughter at bedside  Patient status:Inpatient  Patient is from :home  Anticipated discharge to: Home  Estimated DC date: After clearance from ID and neurosurgery   Consultants: Neurosurgery, ID  Procedures: As above  Antimicrobials:  Anti-infectives (From admission, onward)    Start     Dose/Rate Route Frequency Ordered Stop   11/26/22 0600  ceFAZolin (ANCEF) IVPB 2g/100 mL premix        2 g 200 mL/hr over 30 Minutes Intravenous Every  8 hours 11/26/22 0513 11/26/22 2159   11/25/22 1700  vancomycin (VANCOCIN) IVPB 1000 mg/200 mL premix  Status:  Discontinued        1,000 mg 200 mL/hr over 60 Minutes Intravenous Every 24 hours 11/24/22 1509 11/25/22 1024    11/24/22 1600  vancomycin (VANCOREADY) IVPB 1250 mg/250 mL        1,250 mg 166.7 mL/hr over 90 Minutes Intravenous  Once 11/24/22 1508 11/24/22 1837   11/23/22 1030  cefTRIAXone (ROCEPHIN) 2 g in sodium chloride 0.9 % 100 mL IVPB        2 g 200 mL/hr over 30 Minutes Intravenous Every 24 hours 11/23/22 1015     11/23/22 1015  cefTRIAXone (ROCEPHIN) 2 g in sodium chloride 0.9 % 100 mL IVPB  Status:  Discontinued        2 g 200 mL/hr over 30 Minutes Intravenous  Once 11/23/22 1002 11/23/22 1015       Subjective: Patient seen and examined at bedside today.  He ambulated very well on the hallway with PT.  Appears very comfortable.  Denies any significant pain.  No bowel movement for last few days  Objective: Vitals:   11/26/22 0415 11/26/22 0430 11/26/22 0449 11/26/22 0519  BP: (!) 147/106 (!) 142/72 (!) 162/86 (!) 157/67  Pulse: 70 (!) 128 89   Resp: (!) 22 (!) 22 (!) 22   Temp:  98 F (36.7 C) 98.2 F (36.8 C)   TempSrc:   Oral   SpO2: 97% 96% 100%   Weight:      Height:        Intake/Output Summary (Last 24 hours) at 11/26/2022 0757 Last data filed at 11/26/2022 0300 Gross per 24 hour  Intake 1000 ml  Output 1450 ml  Net -450 ml   Filed Weights   11/23/22 0910  Weight: 55 kg    Examination:  General exam: Overall comfortable, not in distress HEENT: PERRL, surgical wound on the neck Respiratory system:  no wheezes or crackles  Cardiovascular system: S1 & S2 heard, RRR.  Gastrointestinal system: Abdomen is nondistended, soft and nontender. Central nervous system: Alert and oriented Extremities: No edema, no clubbing ,no cyanosis Skin: No rashes, no ulcers,no icterus     Data Reviewed: I have personally reviewed following labs and imaging studies  CBC: Recent Labs  Lab 11/22/22 1312 11/23/22 1400 11/24/22 0637 11/25/22 0530 11/26/22 0549  WBC 9.6 8.7 9.4 9.3 13.1*  NEUTROABS 7.9* 6.7  --  6.4 10.4*  HGB 13.8 12.8* 11.4* 11.0* 11.5*  HCT 41.8 38.6* 34.8*  32.2* 35.2*  MCV 86.7 87.1 87.7 84.1 86.3  PLT 122* 122* 122* 145* 198   Basic Metabolic Panel: Recent Labs  Lab 11/22/22 1312 11/23/22 1400 11/24/22 0637 11/25/22 0530 11/26/22 0549  NA 135 134* 137 136 136  K 4.1 4.0 3.8 3.1* 4.0  CL 102 104 109 104 102  CO2 17* 16* 14* 24 21*  GLUCOSE 103* 71 69* 145* 116*  BUN 36* 41* 31* 22 15  CREATININE 1.13 1.03 0.95 0.76 0.83  CALCIUM 9.0 8.0* 7.8* 7.5* 7.8*  MG  --  2.3  --  2.2 2.0  PHOS  --  4.1  --   --   --      Recent Results (from the past 240 hour(s))  Culture, blood (routine x 2)     Status: Abnormal   Collection Time: 11/22/22  1:08 PM   Specimen: BLOOD RIGHT FOREARM  Result Value Ref Range  Status   Specimen Description   Final    BLOOD RIGHT FOREARM Performed at [facility], [address], [place], [place] [address]    Special Requests   Final    BOTTLES DRAWN AEROBIC AND ANAEROBIC Blood Culture adequate volume Performed at [facility], [address], [place], [place] [address]    Culture  Setup Time   Final    GRAM NEGATIVE RODS AEROBIC BOTTLE ONLY CRITICAL RESULT CALLED TO, READ BACK BY AND VERIFIED WITH: RN Rush Farmer ON [id number] @0723  BY SM Performed at [facility], [address], [place], [place] [address]    Culture ESCHERICHIA COLI (A)  Final   Report Status 11/25/2022 FINAL  Final   Organism ID, Bacteria ESCHERICHIA COLI  Final      Susceptibility   Escherichia coli - MIC*    AMPICILLIN >=32 RESISTANT Resistant     CEFEPIME <=0.12 SENSITIVE Sensitive     CEFTAZIDIME <=1 SENSITIVE Sensitive     CEFTRIAXONE <=0.25 SENSITIVE Sensitive     CIPROFLOXACIN <=0.25 SENSITIVE Sensitive     GENTAMICIN >=16 RESISTANT Resistant     IMIPENEM <=0.25 SENSITIVE Sensitive     TRIMETH/SULFA >=320 RESISTANT Resistant     AMPICILLIN/SULBACTAM 8 SENSITIVE Sensitive     PIP/TAZO <=4 SENSITIVE Sensitive     * ESCHERICHIA COLI  Blood Culture ID Panel (Reflexed)     Status: Abnormal    Collection Time: 11/22/22  1:08 PM  Result Value Ref Range Status   Enterococcus faecalis NOT DETECTED NOT DETECTED Final   Enterococcus Faecium NOT DETECTED NOT DETECTED Final   Listeria monocytogenes NOT DETECTED NOT DETECTED Final   Staphylococcus species NOT DETECTED NOT DETECTED Final   Staphylococcus aureus (BCID) NOT DETECTED NOT DETECTED Final   Staphylococcus epidermidis NOT DETECTED NOT DETECTED Final   Staphylococcus lugdunensis NOT DETECTED NOT DETECTED Final   Streptococcus species NOT DETECTED NOT DETECTED Final   Streptococcus agalactiae NOT DETECTED NOT DETECTED Final   Streptococcus pneumoniae NOT DETECTED NOT DETECTED Final   Streptococcus pyogenes NOT DETECTED NOT DETECTED Final   A.calcoaceticus-baumannii NOT DETECTED NOT DETECTED Final   Bacteroides fragilis NOT DETECTED NOT DETECTED Final   Enterobacterales DETECTED (A) NOT DETECTED Final    Comment: Enterobacterales represent a large order of gram negative bacteria, not a single organism. CRITICAL RESULT CALLED TO, READ BACK BY AND VERIFIED WITH: RN Rush Farmer ON [id number] @0723  BY SM    Enterobacter cloacae complex NOT DETECTED NOT DETECTED Final   Escherichia coli DETECTED (A) NOT DETECTED Final    Comment: CRITICAL RESULT CALLED TO, READ BACK BY AND VERIFIED WITH: RN Rush Farmer ON [id number] @0723  BY SM    Klebsiella aerogenes NOT DETECTED NOT DETECTED Final   Klebsiella oxytoca NOT DETECTED NOT DETECTED Final   Klebsiella pneumoniae NOT DETECTED NOT DETECTED Final   Proteus species NOT DETECTED NOT DETECTED Final   Salmonella species NOT DETECTED NOT DETECTED Final   Serratia marcescens NOT DETECTED NOT DETECTED Final   Haemophilus influenzae NOT DETECTED NOT DETECTED Final   Neisseria meningitidis NOT DETECTED NOT DETECTED Final   Pseudomonas aeruginosa NOT DETECTED NOT DETECTED Final   Stenotrophomonas maltophilia NOT DETECTED NOT DETECTED Final   Candida albicans NOT DETECTED NOT DETECTED Final   Candida auris  NOT DETECTED NOT DETECTED Final   Candida glabrata NOT DETECTED NOT DETECTED Final   Candida krusei NOT DETECTED NOT DETECTED Final   Candida parapsilosis NOT DETECTED NOT DETECTED Final  Candida tropicalis NOT DETECTED NOT DETECTED Final   Cryptococcus neoformans/gattii NOT DETECTED NOT DETECTED Final   CTX-M ESBL NOT DETECTED NOT DETECTED Final   Carbapenem resistance IMP NOT DETECTED NOT DETECTED Final   Carbapenem resistance KPC NOT DETECTED NOT DETECTED Final   Carbapenem resistance NDM NOT DETECTED NOT DETECTED Final   Carbapenem resist OXA 48 LIKE NOT DETECTED NOT DETECTED Final   Carbapenem resistance VIM NOT DETECTED NOT DETECTED Final    Comment: Performed at [facility], [address] [place], [place] [address]  Urine Culture     Status: None   Collection Time: 11/22/22  1:26 PM   Specimen: Urine, Random  Result Value Ref Range Status   Specimen Description   Final    URINE, RANDOM Performed at [facility], [address], [place], [place] [address]    Special Requests   Final    NONE Reflexed from [id number] Performed at [facility], [address], [place], [place] [address]    Culture   Final    NO GROWTH Performed at [facility], [address], [place], [place] [address]    Report Status 11/24/2022 FINAL  Final  SARS Coronavirus 2 by RT PCR (hospital order, performed in [facility] hospital lab) *cepheid single result test* Anterior Nasal Swab     Status: None   Collection Time: 11/22/22  1:40 PM   Specimen: Anterior Nasal Swab  Result Value Ref Range Status   SARS Coronavirus 2 by RT PCR NEGATIVE NEGATIVE Final    Comment: (NOTE) SARS-CoV-2 target nucleic acids are NOT DETECTED.  The SARS-CoV-2 RNA is generally detectable in upper and lower respiratory specimens during the acute phase of infection. The lowest concentration of SARS-CoV-2 viral copies this assay can detect is 250 copies / mL. A negative  result does not preclude SARS-CoV-2 infection and should not be used as the sole basis for treatment or other patient management decisions.  A negative result may occur with improper specimen collection / handling, submission of specimen other than nasopharyngeal swab, presence of viral mutation(s) within the areas targeted by this assay, and inadequate number of viral copies (<250 copies / mL). A negative result must be combined with clinical observations, patient history, and epidemiological information.  Fact Sheet for Patients:   RoadLapTop.co.za  Fact Sheet for Healthcare Providers: http://kim-miller.com/  This test is not yet approved or  cleared by the [place] FDA and has been authorized for detection and/or diagnosis of SARS-CoV-2 by FDA under an Emergency Use Authorization (EUA).  This EUA will remain in effect (meaning this test can be used) for the duration of the COVID-19 declaration under Section 564(b)(1) of the Act, 21 U.S.C. section 360bbb-3(b)(1), unless the authorization is terminated or revoked sooner.  Performed at [facility], [address], [place], [place] [address]   Culture, blood (Routine X 2) w Reflex to ID Panel     Status: None (Preliminary result)   Collection Time: 11/24/22  3:27 PM   Specimen: BLOOD LEFT HAND  Result Value Ref Range Status   Specimen Description   Final    BLOOD LEFT HAND Performed at [facility], [address] [place], [place] [address]    Special Requests   Final    AEROBIC BOTTLE ONLY Blood Culture adequate volume Performed at [facility], [address], [place], [place] [address]    Culture   Final  NO GROWTH < 24 HOURS Performed at [facility], [address], [place], [place] [address]    Report Status PENDING  Incomplete  Surgical pcr screen     Status: Abnormal   Collection Time: 11/25/22  8:22  PM   Specimen: Nasal Mucosa; Nasal Swab  Result Value Ref Range Status   MRSA, PCR NEGATIVE NEGATIVE Final   Staphylococcus aureus POSITIVE (A) NEGATIVE Final    Comment: (NOTE) The Xpert SA Assay (FDA approved for NASAL specimens in patients 39 years of age and older), is one component of a comprehensive surveillance program. It is not intended to diagnose infection nor to guide or monitor treatment. Performed at [facility], [address] [place], [place] [address]   Aerobic/Anaerobic Culture w Gram Stain (surgical/deep wound)     Status: None (Preliminary result)   Collection Time: 11/26/22  1:11 AM   Specimen: Soft Tissue, Other  Result Value Ref Range Status   Specimen Description OTHER  Final   Special Requests SFT TISS  Final   Gram Stain   Final    NO WBC SEEN NO ORGANISMS SEEN Performed at [facility], [address] [place], [place] [address]    Culture PENDING  Incomplete   Report Status PENDING  Incomplete  Aerobic/Anaerobic Culture w Gram Stain (surgical/deep wound)     Status: None (Preliminary result)   Collection Time: 11/26/22  1:21 AM   Specimen: Abscess  Result Value Ref Range Status   Specimen Description ABSCESS  Final   Special Requests NONE  Final   Gram Stain   Final    RARE WBC PRESENT, PREDOMINANTLY PMN NO ORGANISMS SEEN Performed at [facility], [address] [place], [place] [address]    Culture PENDING  Incomplete   Report Status PENDING  Incomplete     Radiology Studies: DG Cervical Spine 1 View  Result Date: 11/26/2022 CLINICAL DATA:  ACDF EXAM: DG CERVICAL SPINE - 1 VIEW COMPARISON:  None Available. FINDINGS: The first image shows probe tip at the C6-7 disc space. The second image shows the probe tip at the C5-6 disc space. The third image shows completed C4-6 ACDF. IMPRESSION: Intraoperative fluoroscopy for C4-6 ACDF. Electronically Signed   By: Deatra Robinson M.D.   On: 11/26/2022 02:29   MR CERVICAL SPINE W WO  CONTRAST  Result Date: 11/24/2022 CLINICAL DATA:  Cervical radiculopathy. Infection suspected. Severe neck and shoulder pain. Bacteremia. EXAM: MRI CERVICAL SPINE WITHOUT AND WITH CONTRAST TECHNIQUE: Multiplanar and multiecho pulse sequences of the cervical spine, to include the craniocervical junction and cervicothoracic junction, were obtained without and with intravenous contrast. CONTRAST:  5mL GADAVIST GADOBUTROL 1 MMOL/ML IV SOLN COMPARISON:  CT of the cervical spine 11/22/2022 FINDINGS: Alignment: 3 mm anterolisthesis is present at C3-4. 2 mm anterolisthesis is present at C4-5. Straightening of the normal cervical lordosis is present. Vertebrae: Signal and enhancement is present diffusely in the C4 and C5 vertebral bodies. Abnormal enhancement extends into the posterior elements both levels. Cord: Cord is compressed the C4-5 level less than 6 mm. Increased T2 signal is present within the cord at C4 and C5. Extensive dural enhancement extends C2 through T1-2 posteriorly. This is most prominent from C2-3 through C7. A right ventral epidural abscess measures 6 x 2 x 12 mm posterior to the C5 vertebral body. A left ventral epidural abscess measures 4 x 2 x 10 mm posterior to the C5 vertebral body. Cord size and signal  is normal above the C3 vertebral level and below the C6 vertebral level. Posterior Fossa, vertebral arteries, paraspinal tissues: Prevertebral edema and enhancement extends from the C1-2 level through C6. No abnormal fluid or enhancement extends into the mediastinum. The craniocervical junction is normal. Vertebral arteries are patent bilaterally. Disc levels: C2-3: Abnormal dural enhancement is present without focal stenosis. C3-4: More prominent circumferential dural enhancement is present. Uncovertebral spurring contributes to mild right foraminal stenosis. C4-5: Extensive epidural enhancement compresses the spinal cord to less than 6 mm. Enhancing soft tissue extends into the foramina  bilaterally. Severe left foraminal stenosis is secondary to uncovertebral and facet hypertrophy. C5-6: Circumferential epidural enhancement is present. Moderate foraminal stenosis is present bilaterally. C6-7: Abnormal dural and epidural enhancement is present without significant central canal stenosis. Abnormal enhancement extends into the foramina. C7-T1: Mild posterior dural enhancement is present. No focal disc disease or stenosis is present. IMPRESSION: 1. 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 2. Extensive dural enhancement extends from the C1-2 level through T1-2 posteriorly. This is most prominent from C2-3 through C7. 3. Increased T2 signal is present within the cervical spinal cord at C4 and C5 consistent with spinal cord edema. 4. Prevertebral edema and enhancement extends from the C1-2 level through C6. No extension into the mediastinum. 5. Severe left foraminal stenosis at C4-5. 6. Moderate foraminal stenosis bilaterally at C5-6. 7. Mild right foraminal stenosis at C3-4. 8. Mild posterior dural enhancement at C7-T1 without significant central canal stenosis. 9. Abnormal enhancement extends into the foramina bilaterally at C4-5, C5-6, and C6-7. Critical Value/emergent results were called by telephone at the time of interpretation on 11/24/2022 at 2:36 pm to provider [facility] , who verbally acknowledged these results. Electronically Signed   By: Marin Roberts M.D.   On: 11/24/2022 14:49   MR SHOULDER RIGHT W WO CONTRAST  Result Date: 11/24/2022 CLINICAL DATA:  Right shoulder pain.  Bacteremia EXAM: MRI OF THE RIGHT SHOULDER WITHOUT AND WITH CONTRAST TECHNIQUE: Multiplanar, multisequence MR imaging of the right shoulder was performed before and after the administration of intravenous contrast. CONTRAST:  5mL GADAVIST GADOBUTROL 1 MMOL/ML IV SOLN COMPARISON:  X-ray 11/22/2022 FINDINGS:  Technical Note: Despite efforts by the technologist and patient, motion artifact is present on today's exam and could not be eliminated. This reduces exam sensitivity and specificity. Rotator cuff: Full thickness, near-complete tear of the distal supraspinatus tendon with retraction to the level of the humeral head apex. Partial-thickness tearing involving the cranial aspect of the distal subscapularis tendon. Intact infraspinatus and teres minor tendons. Muscles: Preserved bulk and signal intensity of the rotator cuff musculature without edema, atrophy, or fatty infiltration. Biceps long head: Intra-articular biceps tendinosis. Biceps tendon is appropriately positioned within the bicipital groove. Acromioclavicular Joint: Minimal degenerative changes of the AC joint. Small volume subacromial-subdeltoid bursal fluid communicating with the glenohumeral joint. Glenohumeral Joint: Small glenohumeral joint effusion with mild enhancing synovitis. Mild diffuse chondral thinning of the glenohumeral joint. Labrum:  Appears degenerated, although suboptimally assessed. Bones: No acute fracture. No dislocation. Reactive subcortical cystic changes within the posterior aspect of the greater tuberosity. No erosion. No marrow replacing bone lesion. Other: None. IMPRESSION: RIGHT SHOULDER: 1. Small glenohumeral joint effusion with relatively mild synovitis. This is favored to be reactive although septic arthritis is not entirely excluded in the setting of bacteremia. 2. Full-thickness, near-complete tear of the  distal supraspinatus tendon with retraction to the level of the humeral head apex. 3. Partial-thickness tearing of the distal subscapularis tendon. 4. Intra-articular biceps tendinosis. Electronically Signed   By: Duanne Guess D.O.   On: 11/24/2022 14:06   MR Shoulder Left W Wo Contrast  Result Date: 11/24/2022 CLINICAL DATA:  Shoulder pain, chronic, no prior imaging. Bacteremia EXAM: MRI OF THE LEFT SHOULDER WITHOUT  AND WITH CONTRAST TECHNIQUE: Multiplanar, multisequence MR imaging of the left shoulder was performed before and after the administration of intravenous contrast. CONTRAST:  5mL GADAVIST GADOBUTROL 1 MMOL/ML IV SOLN COMPARISON:  X-ray 11/22/2022 FINDINGS: Technical Note: Despite efforts by the technologist and patient, motion artifact is present on today's exam and could not be eliminated. This reduces exam sensitivity and specificity. Rotator cuff: Full-thickness, near complete tear of the subscapularis tendon with minimal retraction. Severe supraspinatus and infraspinatus tendinosis. High-grade partial-thickness tear along the anterior leading edge of the distal supraspinatus tendon. Intact teres minor. Muscles: Preserved bulk and signal intensity of the rotator cuff musculature without edema, atrophy, or fatty infiltration. Biceps long head: Long head biceps tendon is medially dislocated out of the bicipital groove. Acromioclavicular Joint: Moderate degenerative changes of the AC joint. Trace subacromial-subdeltoid bursal fluid. Glenohumeral Joint: Small glenohumeral joint effusion with relatively mild synovitis. No cartilage defect. Labrum:  Suboptimally assessed. Bones: No acute fracture. No dislocation. Degenerative subchondral marrow edema centered at the [facility] joint with small subchondral cystic changes. No erosion. No marrow replacing bone lesion. Other: None. IMPRESSION: LEFT SHOULDER: 1. Small glenohumeral joint effusion with relatively mild synovitis. This is favored to be reactive although septic arthritis is not entirely excluded in the setting of bacteremia. 2. Full-thickness, near-complete tear of the subscapularis tendon with minimal retraction. 3. Severe supraspinatus and infraspinatus tendinosis with high-grade partial-thickness tear along the anterior leading edge of the distal supraspinatus tendon. 4. Medial dislocation of the long head biceps tendon out of the bicipital groove. 5. Moderate AC  joint osteoarthritis. Electronically Signed   By: Duanne Guess D.O.   On: 11/24/2022 14:01    Scheduled Meds:  Chlorhexidine Gluconate Cloth  6 each Topical Q0600   docusate sodium  100 mg Oral BID   fentaNYL       labetalol       lidocaine  1 patch Transdermal Q24H   losartan  50 mg Oral Daily   mupirocin ointment  1 Application Nasal BID   senna  1 tablet Oral BID   sodium chloride flush  3 mL Intravenous Q12H   Continuous Infusions:  sodium chloride      ceFAZolin (ANCEF) IV 2 g (11/26/22 0735)   cefTRIAXone (ROCEPHIN)  IV 2 g (11/25/22 0911)   dextrose 5 % and 0.9 % NaCl with KCl 40 mEq/L 75 mL/hr at 11/25/22 1249     LOS: 3 days   Burnadette Pop, MD [facility] P7/30/2024, 7:57 AM

## 2022-11-26 NOTE — TOC Initial Note (Signed)
Transition of Care [facility]) - Initial/Assessment Note    Patient Details  Name: Frederick Andrews MRN: [id number] Date of Birth: 11/13/1950  Transition of Care [facility]) CM/SW Contact:    Kermit Balo, RN Phone Number: 11/26/2022, 10:05 AM  Clinical Narrative:         PCP: Deetta Perla, PA   at [facility]   CM met with the patient and his daughter at the bedside. Daughter provided interpreter services.  No DME at home. Pt drives self but son can provide transportation as needed.  Pt manages his own medications and denies any issues.  Awaiting ID consult.  TOC following.   Expected Discharge Plan: Home w Home Health Services Barriers to Discharge: Continued Medical Work up   Patient Goals and CMS Choice   CMS Medicare.gov Compare Post Acute Care list provided to:: Patient Choice offered to / list presented to : Patient, Adult Children      Expected Discharge Plan and Services   Discharge Planning Services: CM Consult   Living arrangements for the past 2 months: Single Family Home                                      Prior Living Arrangements/Services Living arrangements for the past 2 months: Single Family Home Lives with:: Spouse, Adult Children Patient language and need for interpreter reviewed:: Yes Do you feel safe going back to the place where you live?: Yes        Care giver support system in place?: Yes (comment)   Criminal Activity/Legal Involvement Pertinent to Current Situation/Hospitalization: No - Comment as needed  Activities of Daily Living Home Assistive Devices/Equipment: None ADL Screening (condition at time of admission) Patient's cognitive ability adequate to safely complete daily activities?: Yes Is the patient deaf or have difficulty hearing?: No Does the patient have difficulty seeing, even when wearing glasses/contacts?: No Does the patient have difficulty concentrating, remembering, or making decisions?: No Patient able to express  need for assistance with ADLs?: Yes Does the patient have difficulty dressing or bathing?: No Independently performs ADLs?: Yes (appropriate for developmental age) Does the patient have difficulty walking or climbing stairs?: Yes Weakness of Legs: None Weakness of Arms/Hands: None  Permission Sought/Granted                  Emotional Assessment Appearance:: Appears stated age     Orientation: : Oriented to Self, Oriented to Place, Oriented to  Time, Oriented to Situation   Psych Involvement: No (comment)  Admission diagnosis:  Bacteremia [R78.81] Bacteremia due to Escherichia coli [R78.81, B96.20] 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] Patient Active Problem List   Diagnosis Date Noted   Epidural abscess 11/25/2022   Bacteremia due to Escherichia coli 11/23/2022   Coronary artery calcification 11/23/2022   Aortic atherosclerosis (HCC) 11/23/2022   Normocytic anemia 11/23/2022   Bilateral chronic knee pain 04/27/2021   Hyperlipidemia 05/30/2016   Essential hypertension 05/30/2016   Chronic lung disease 05/30/2016   Tobacco use 05/30/2016   PCP:  Patient, No Pcp Per Pharmacy:   [id number] [place], [place] - [address] AT [facility] OF [facility] & [address] [place] [address] Phone: [phone] Fax: [phone]     Social Determinants of Health (SDOH) Social History: SDOH Screenings   Food Insecurity: No Food Insecurity (11/25/2022)  Housing: Low Risk  (11/25/2022)  Transportation Needs: No Transportation Needs (11/25/2022)  Utilities: Not At Risk (11/25/2022)  Financial Resource Strain: Low Risk  (11/20/2022)   Received from [facility]  Physical Activity: Insufficiently Active (06/06/2022)   Received from [facility]  Social Connections: Socially Integrated (06/06/2022)   Received from [facility]  Stress: No Stress Concern Present (06/06/2022)   Received from [facility]  Tobacco Use: High Risk (11/26/2022)   SDOH  Interventions:     Readmission Risk Interventions     No data to display

## 2022-11-26 NOTE — H&P (Signed)
History and Physical    Patient: Frederick Andrews GMW:[id number] DOB: 12-19-1950 DOA: 11/23/2022 DOS:  11/25/2022 PCP: Patient, No Pcp Per  Patient coming from:  [facility]  Chief Complaint:  Chief Complaint  Patient presents with   positive blood cultures   HPI: Frederick Andrews is a 72 y.o. male with history of hypertension, hyperlipidemia, continued tobacco use presented to the emergency department after having a fall with left shoulder pain. Patient was found to have low-grade fevers at presentation, obtain blood cultures which came back to be positive for E coli 2/2. Urinalysis is positive for urinary tract infection. Patient was also complaining of severe neck and bilateral shoulder pain. Obtained MRI of the neck and shoulder showed C4-C5 diskitis/osteomyelitis with C5 epidural abscess. MRI of the shoulders could note rule out septic arthritis. Neurosurgery is consulted. Concerning about patient's positive blood cultures with E coli patient was on Rocephin also added vancomycin. Id is consulted. 2D echocardiogram showed  EF of 55-60%, with the mild  mitralvalvular regurgitation, moderate AR, severe pulmonary regurgitation. Cardiology is consulted for transesophageal echocardiogram. Orthopedic surgery is consulted, pain is due to severe rotator cuff tear, unlikely septic arthritis.  Neurosurgery is consulted, plan for decompressive surgery, drainage of the abscess.  Patient [facility] for further care Review of Systems: As mentioned in the history of present illness. All other systems reviewed and are negative. Past Medical History:  Diagnosis Date   Hyperlipemia    Hypertension    History reviewed. No pertinent surgical history. Social History:  reports that he has been smoking cigarettes. He has a 40 pack-year smoking history. He does not have any smokeless tobacco history on file. He reports that he does not drink alcohol and does not use drugs.  No Known  Allergies  Family History  Problem Relation Age of Onset   Heart disease Mother    Hypertension Mother    Hyperlipidemia Mother    Hyperlipidemia Other     Prior to Admission medications   Medication Sig Start Date End Date Taking? Authorizing Provider  albuterol (VENTOLIN HFA) 108 (90 Base) MCG/ACT inhaler Inhale 2 puffs into the lungs every 4 (four) hours as needed for wheezing. 04/27/21  Yes [provider]  aspirin EC 81 MG tablet Take 81 mg by mouth daily.     Yes [provider]  atorvastatin (LIPITOR) 20 MG tablet Take 20 mg by mouth daily. 07/22/22  Yes [provider]  cyclobenzaprine (FEXMID) 7.5 MG tablet Take 7.5 mg by mouth 3 (three) times daily as needed for muscle spasms. 11/20/22 11/30/22 Yes [provider]  losartan (COZAAR) 50 MG tablet Take 50 mg by mouth daily. 07/22/22  Yes [provider]  HYDROcodone-acetaminophen (NORCO) 5-325 MG per tablet Take 1-2 tablets by mouth every 6 (six) hours as needed for severe pain. Patient not taking: Reported on 11/23/2022 11/19/14   Doug Sou, MD    Physical Exam: Vitals:   11/26/22 0415 11/26/22 0430 11/26/22 0449 11/26/22 0519  BP: (!) 147/106 (!) 142/72 (!) 162/86 (!) 157/67  Pulse: 70 (!) 128 89   Resp: (!) 22 (!) 22 (!) 22   Temp:  98 F (36.7 C) 98.2 F (36.8 C)   TempSrc:   Oral   SpO2: 97% 96% 100%   Weight:      Height:       General exam: Appears calm and comfortable. : Deconditioned.  On room air. Respiratory system: Bilateral decreased breath sounds at  bases Cardiovascular system: S1 & S2 heard, Rate controlled Gastrointestinal system: Abdomen is nondistended, soft and nontender. Normal bowel sounds heard. Extremities: No cyanosis, clubbing, edema  Central nervous system: Awake.  No focal neurological deficits. Moving extremities Skin: No rashes, lesions or ulcers Psychiatry: Flat affect.  Not agitated. Musculoskeletal: Neck and right shoulder tenderness and  decreased range of motion present. tenderness to palpation  in the left shoulder joint, decrease in range of motion especially abduction  Data Reviewed:  Reviewed labs, radiology reports, consultation reports  Assessment and Plan:  Severe sepsis secondary to E coli bacteremia -blood cultures are growing E coli, sensitivities are pending -repeat blood cultures concerning about the diskitis -echocardiogram showed EF of 55-60% with mild mitral valve regurgitation, moderate aortic regurgitation, severe pulmonary valve regurgitation. -repeat blood cultures - continue with Rocephin, vancomycin.     C5-C6 diskitis with abscess -neurosurgery is consulted  -continue with neuro checks -continue with the vancomycin, add meropenem while waiting for the  sensitivities.     Bilateral shoulder pain -left greater than the right -MRI of the left shoulder small glenohumeral joint effusion, full thickness, near complete tear of the sub capsular recess 10 done, severe supraspinatus and infraspinatus tendinosis with high-grade partial thickness tear along the anterior leading edge of the distal supraspinatus tendon, medial dislocation of the long head biceps tendon, moderate AC joint osteoarthritis. -MRI of the left shoulder full-thickness, near complete tear of the distal supraspinatus tendon with retraction to the level of humeral head FX.  Partial  thickness tearing in the distal sub capsular wrist tendon. - consulted orthopedic surgery  - feeling more arthritis with possible severe rotator cuff care, unlikely septic arthritis   Hypertension accelerated - currently on hydralazine, losartan.   - add amlodipine   Urinary tract infection -follow up with urine cultures - continue with current antibiotic regimen.     Hypoglycemia -secondary to decreased p.o. intake   Thrombocytopenia  - secondary to sepsis   Thrombocytopenia -Questionable cause.  Monitor intermittently.  No signs of  bleeding.  Continued tobacco use -counseling done  - offered nicotine patch  Advance Care Planning:   Code Status: Full Code   Consults:   orthopedic surgery Neurosurgery Infectious diseases  Cardiology   Family Communication:   Severity of Illness: The appropriate patient status for this patient is INPATIENT. Inpatient status is judged to be reasonable and necessary in order to provide the required intensity of service to ensure the patient's safety. The patient's presenting symptoms, physical exam findings, and initial radiographic and laboratory data in the context of their chronic comorbidities is felt to place them at high risk for further clinical deterioration. Furthermore, it is not anticipated that the patient will be medically stable for discharge from the hospital within 2 midnights of admission.   * I certify that at the point of admission it is my clinical judgment that the patient will require inpatient hospital care spanning beyond 2 midnights from the point of admission due to high intensity of service, high risk for further deterioration and high frequency of surveillance required.*  AuthorSusa Griffins, MD 11/26/2022 7:20 AM  For on call review [email or web address].

## 2022-11-26 NOTE — Anesthesia Postprocedure Evaluation (Signed)
Anesthesia Post Note  Patient: Biochemist, clinical  Procedure(s) Performed: ANTERIOR CERVICAL DECOMPRESSION FUSION FOR EPIDURAL ABSCESS CERVICAL FOUR-FIVE AND CERVICAL FIVE-SIX (Neck)     Patient location during evaluation: PACU Anesthesia Type: General Level of consciousness: awake and alert Pain management: pain level controlled Vital Signs Assessment: post-procedure vital signs reviewed and stable Respiratory status: spontaneous breathing, nonlabored ventilation, respiratory function stable and patient connected to nasal cannula oxygen Cardiovascular status: blood pressure returned to baseline and stable Postop Assessment: no apparent nausea or vomiting Anesthetic complications: no   No notable events documented.  Last Vitals:  Vitals:   11/26/22 0449 11/26/22 0519  BP: (!) 162/86 (!) 157/67  Pulse: 89   Resp: (!) 22   Temp: 36.8 C   SpO2: 100%     Last Pain:  Vitals:   11/26/22 0449  TempSrc: Oral  PainSc: 0-No pain                 Nelle Don 

## 2022-11-27 ENCOUNTER — Other Ambulatory Visit: Payer: Self-pay

## 2022-11-27 ENCOUNTER — Encounter (HOSPITAL_COMMUNITY): Payer: Self-pay | Admitting: Neurological Surgery

## 2022-11-27 ENCOUNTER — Inpatient Hospital Stay (HOSPITAL_COMMUNITY): Payer: 59

## 2022-11-27 DIAGNOSIS — R7881 Bacteremia: Secondary | ICD-10-CM | POA: Diagnosis not present

## 2022-11-27 DIAGNOSIS — B9620 Unspecified Escherichia coli [E. coli] as the cause of diseases classified elsewhere: Secondary | ICD-10-CM | POA: Diagnosis not present

## 2022-11-27 MED ORDER — POTASSIUM CHLORIDE CRYS ER 20 MEQ PO TBCR
40.0000 meq | EXTENDED_RELEASE_TABLET | Freq: Once | ORAL | Status: AC
  Administered 2022-11-27: 40 meq via ORAL
  Filled 2022-11-27: qty 2

## 2022-11-27 MED ORDER — IPRATROPIUM-ALBUTEROL 0.5-2.5 (3) MG/3ML IN SOLN
3.0000 mL | Freq: Four times a day (QID) | RESPIRATORY_TRACT | Status: DC
  Administered 2022-11-27: 3 mL via RESPIRATORY_TRACT
  Filled 2022-11-27: qty 3

## 2022-11-27 MED ORDER — AMLODIPINE BESYLATE 10 MG PO TABS
10.0000 mg | ORAL_TABLET | Freq: Every day | ORAL | Status: DC
  Administered 2022-11-28: 10 mg via ORAL
  Filled 2022-11-27: qty 1

## 2022-11-27 MED ORDER — AMLODIPINE BESYLATE 5 MG PO TABS
5.0000 mg | ORAL_TABLET | Freq: Every day | ORAL | Status: DC
  Administered 2022-11-27: 5 mg via ORAL
  Filled 2022-11-27: qty 1

## 2022-11-27 MED ORDER — SODIUM CHLORIDE 3 % IN NEBU
4.0000 mL | INHALATION_SOLUTION | Freq: Every day | RESPIRATORY_TRACT | Status: DC
  Administered 2022-11-27 – 2022-11-28 (×2): 4 mL via RESPIRATORY_TRACT
  Filled 2022-11-27 (×3): qty 4

## 2022-11-27 MED ORDER — GUAIFENESIN ER 600 MG PO TB12
600.0000 mg | ORAL_TABLET | Freq: Two times a day (BID) | ORAL | Status: DC
  Administered 2022-11-27 – 2022-11-29 (×5): 600 mg via ORAL
  Filled 2022-11-27 (×5): qty 1

## 2022-11-27 MED ORDER — OXYCODONE HCL 5 MG PO TABS
5.0000 mg | ORAL_TABLET | Freq: Four times a day (QID) | ORAL | Status: DC | PRN
  Administered 2022-11-27 – 2022-11-29 (×7): 5 mg via ORAL
  Filled 2022-11-27 (×7): qty 1

## 2022-11-27 NOTE — Progress Notes (Addendum)
PROGRESS NOTE  Calil Vanella  KGM:[id number] DOB: 04-14-1951 DOA: 11/23/2022 PCP: Patient, No Pcp Per   Brief Narrative: Patient is a 72 year old male with history of hyperlipidemia, hypertension, tobacco use who presented with positive blood cultures.  Patient presented to ED 1 day prior to admission with left shoulder pain after having a fall.  Blood cultures were obtained at the time because he had low-grade temperatures.  Blood cultures subsequently showed gram-negative rods and he was recalled to the ED.  On presentation,he did not have any leukocytosis.  CT chest did not show any PE.  CT abdomen/pelvis showed mild urinary distention, CT head was negative for acute intracranial findings.  CT cervical spine showed multilevel cervical spondylosis.  Patient was admitted for sepsis/bacteremia and was started on IV antibiotics.  Subsequent MRI of the cervical found showed discitis/osteomyelitis at C4-5 with ventral epidural abscesses at the C5 level measuring 6 x 2 x 12 mm on the right and 4 x 2 x 10 mm on the left.  Patient urgently transferred to a [facility] for neurosurgery.  Underwent anterior cervical decompression C4-C5, C5-C6 arthrodesis with distal stable fixation and allograft anterior plate fixation of C4-C6 by Dr. Danielle Dess.  Blood cultures positive for E. coli.  ID also following, plan for 6 weeks course of ceftriaxone.  PICC line ordered today.  Complains of some shortness of breath, pending chest x-ray, ordered bronchodilators.  Possible discharge in next 1-2 days  if he remains comfortable and if neurosurgery clears  Assessment & Plan:  Principal Problem:   Bacteremia due to Escherichia coli Active Problems:   Hyperlipidemia   Essential hypertension   Chronic lung disease   Tobacco use   Coronary artery calcification   Aortic atherosclerosis (HCC)   Normocytic anemia   Epidural abscess  Cervical discitis/osteomyelitis with epidural abscess/E. coli bacteremia: Scenario as  above. Patient was admitted for sepsis/bacteremia and was started on IV antibiotics.  Subsequent MRI of the cervical found showed discitis/osteomyelitis at C4-5 with ventral epidural abscesses at the C5 level measuring 6 x 2 x 12 mm on the right and 4 x 2 x 10 mm on the left.  [facility].  Underwent anterior cervical decompression C4-C5, C5-C6 arthrodesis with distal stable fixation and allograft anterior plate fixation of C4-C6 by Dr. Danielle Dess.  Blood cultures positive for E. coli.  ID also following.  Elevated CRP.  Has mild leukocytosis.On ceftraxone.  No WBCs or organisms seen as per wound culture report. PT/OT consulted .  He ambulated well without any assistance this morning.  Will follow-up with neurosurgery for clearance before discharge. ID recommended 6 weeks of ceftriaxone, plan for PICC line placement.  Severe neck/bilateral shoulder pain: Orthopedics was consulted.  No evidence of septic arthritis but his symptoms are possibly due to severe rotator cuff pathology and glenohumeral arthritis.  No need of surgical intervention as per orthopedics.  Continue pain management and support care and orthopedics follow-up as an outpatient.   Shortness of breath, cough: He was saturating fine on room air.  Will check chest x-ray.  Continue current medications, bronchodilators added  Normocytic anemia: Currently hemoglobin stable.  Hypertension/sinus tachycardia: Much improved now.  Continue to monitor.On losartan  at home, restarted.  Added 10 mg  amlodipine  History of hyperlipidemia: On Lipitor  Smoker: on nicotene patch  Constipation: Continue bowel regimen.  Had a small bowel movement yesterday  Urinary retention: New problem.  Started on Flomax.  Foley had  to be inserted because he needed repeated in/ out.  Most likely he will need Foley on discharge and can follow-up with urology as an outpatient.  Can also give voiding trial before  discharge         DVT prophylaxis:SCD's Start: 11/26/22 0514     Code Status: Full Code  Family Communication: Daughter at bedside on 7/31  Patient status:Inpatient  Patient is from :home  Anticipated discharge to: Home  Estimated DC date: After clearance from  neurosurgery, likely in next 1 to 2 days if he feels better   Consultants: Neurosurgery, ID  Procedures: As above  Antimicrobials:  Anti-infectives (From admission, onward)    Start     Dose/Rate Route Frequency Ordered Stop   11/26/22 0600  ceFAZolin (ANCEF) IVPB 2g/100 mL premix        2 g 200 mL/hr over 30 Minutes Intravenous Every 8 hours 11/26/22 0513 11/26/22 1448   11/25/22 1700  vancomycin (VANCOCIN) IVPB 1000 mg/200 mL premix  Status:  Discontinued        1,000 mg 200 mL/hr over 60 Minutes Intravenous Every 24 hours 11/24/22 1509 11/25/22 1024   11/24/22 1600  vancomycin (VANCOREADY) IVPB 1250 mg/250 mL        1,250 mg 166.7 mL/hr over 90 Minutes Intravenous  Once 11/24/22 1508 11/24/22 1837   11/23/22 1030  cefTRIAXone (ROCEPHIN) 2 g in sodium chloride 0.9 % 100 mL IVPB        2 g 200 mL/hr over 30 Minutes Intravenous Every 24 hours 11/23/22 1015     11/23/22 1015  cefTRIAXone (ROCEPHIN) 2 g in sodium chloride 0.9 % 100 mL IVPB  Status:  Discontinued        2 g 200 mL/hr over 30 Minutes Intravenous  Once 11/23/22 1002 11/23/22 1015       Subjective: Patient seen and examined at the bedside today.  In comparison to yesterday, he looked different.  He was complaining of some cough, has  chest pain and shoulder pain while coughing.  Also has been retaining urine  Objective: Vitals:   11/26/22 2314 11/27/22 0306 11/27/22 0722 11/27/22 1104  BP: (!) 152/72 (!) 174/70 (!) 159/81 (!) 160/76  Pulse: 82 73 71 81  Resp: 19 19    Temp: 98.3 F (36.8 C) 98.6 F (37 C) 98.3 F (36.8 C) 98.6 F (37 C)  TempSrc: Oral Oral Oral Oral  SpO2: 93% 94% 95% 94%  Weight:      Height:         Intake/Output Summary (Last 24 hours) at 11/27/2022 1317 Last data filed at 11/27/2022 1149 Gross per 24 hour  Intake 879.35 ml  Output 1300 ml  Net -420.65 ml   Filed Weights   11/23/22 0910  Weight: 55 kg    Examination:  General exam: lying on bed, not in distress HEENT: PERRL, surgical wound on the neck Respiratory system: Mild bilateral rhonchi, no wheezing Cardiovascular system: S1 & S2 heard, RRR.  Gastrointestinal system: Abdomen is nondistended, soft and nontender. Central nervous system: Alert and oriented Extremities: No edema, no clubbing ,no cyanosis Skin: No rashes, no ulcers,no icterus     Data Reviewed: I have personally reviewed following labs and imaging studies  CBC: Recent Labs  Lab 11/22/22 1312 11/23/22 1400 11/24/22 0637 11/25/22 0530 11/26/22 0549 11/27/22 0220  WBC 9.6 8.7 9.4 9.3 13.1* 11.1*  NEUTROABS 7.9* 6.7  --  6.4 10.4*  --   HGB 13.8 12.8* 11.4* 11.0* 11.5*  10.8*  HCT 41.8 38.6* 34.8* 32.2* 35.2* 32.4*  MCV 86.7 87.1 87.7 84.1 86.3 84.4  PLT 122* 122* 122* 145* 198 235   Basic Metabolic Panel: Recent Labs  Lab 11/23/22 1400 11/24/22 0637 11/25/22 0530 11/26/22 0549 11/27/22 0220  NA 134* 137 136 136 133*  K 4.0 3.8 3.1* 4.0 3.3*  CL 104 109 104 102 100  CO2 16* 14* 24 21* 23  GLUCOSE 71 69* 145* 116* 106*  BUN 41* 31* 22 15 12   CREATININE 1.03 0.95 0.76 0.83 0.73  CALCIUM 8.0* 7.8* 7.5* 7.8* 7.7*  MG 2.3  --  2.2 2.0  --   PHOS 4.1  --   --   --   --      Recent Results (from the past 240 hour(s))  Culture, blood (routine x 2)     Status: Abnormal   Collection Time: 11/22/22  1:08 PM   Specimen: BLOOD RIGHT FOREARM  Result Value Ref Range Status   Specimen Description   Final    BLOOD RIGHT FOREARM Performed at [facility], [address], [place], [place] [address]    Special Requests   Final    BOTTLES DRAWN AEROBIC AND ANAEROBIC Blood Culture adequate volume Performed at [facility], [address] [place], [place] [address]    Culture  Setup Time   Final    GRAM NEGATIVE RODS AEROBIC BOTTLE ONLY CRITICAL RESULT CALLED TO, READ BACK BY AND VERIFIED WITH: RN Rush Farmer ON [id number] @0723  BY SM Performed at [facility], [address], [place], [place] [address]    Culture ESCHERICHIA COLI (A)  Final   Report Status 11/25/2022 FINAL  Final   Organism ID, Bacteria ESCHERICHIA COLI  Final      Susceptibility   Escherichia coli - MIC*    AMPICILLIN >=32 RESISTANT Resistant     CEFEPIME <=0.12 SENSITIVE Sensitive     CEFTAZIDIME <=1 SENSITIVE Sensitive     CEFTRIAXONE <=0.25 SENSITIVE Sensitive     CIPROFLOXACIN <=0.25 SENSITIVE Sensitive     GENTAMICIN >=16 RESISTANT Resistant     IMIPENEM <=0.25 SENSITIVE Sensitive     TRIMETH/SULFA >=320 RESISTANT Resistant     AMPICILLIN/SULBACTAM 8 SENSITIVE Sensitive     PIP/TAZO <=4 SENSITIVE Sensitive     * ESCHERICHIA COLI  Blood Culture ID Panel (Reflexed)     Status: Abnormal   Collection Time: 11/22/22  1:08 PM  Result Value Ref Range Status   Enterococcus faecalis NOT DETECTED NOT DETECTED Final   Enterococcus Faecium NOT DETECTED NOT DETECTED Final   Listeria monocytogenes NOT DETECTED NOT DETECTED Final   Staphylococcus species NOT DETECTED NOT DETECTED Final   Staphylococcus aureus (BCID) NOT DETECTED NOT DETECTED Final   Staphylococcus epidermidis NOT DETECTED NOT DETECTED Final   Staphylococcus lugdunensis NOT DETECTED NOT DETECTED Final   Streptococcus species NOT DETECTED NOT DETECTED Final   Streptococcus agalactiae NOT DETECTED NOT DETECTED Final   Streptococcus pneumoniae NOT DETECTED NOT DETECTED Final   Streptococcus pyogenes NOT DETECTED NOT DETECTED Final   A.calcoaceticus-baumannii NOT DETECTED NOT DETECTED Final   Bacteroides fragilis NOT DETECTED NOT DETECTED Final   Enterobacterales DETECTED (A) NOT DETECTED Final    Comment: Enterobacterales represent a large order of gram negative  bacteria, not a single organism. CRITICAL RESULT CALLED TO, READ BACK BY AND VERIFIED WITH: RN Rush Farmer ON [id number] @0723  BY SM    Enterobacter cloacae complex NOT DETECTED NOT DETECTED  Final   Escherichia coli DETECTED (A) NOT DETECTED Final    Comment: CRITICAL RESULT CALLED TO, READ BACK BY AND VERIFIED WITH: RN Rush Farmer ON [phone] @0723  BY SM    Klebsiella aerogenes NOT DETECTED NOT DETECTED Final   Klebsiella oxytoca NOT DETECTED NOT DETECTED Final   Klebsiella pneumoniae NOT DETECTED NOT DETECTED Final   Proteus species NOT DETECTED NOT DETECTED Final   Salmonella species NOT DETECTED NOT DETECTED Final   Serratia marcescens NOT DETECTED NOT DETECTED Final   Haemophilus influenzae NOT DETECTED NOT DETECTED Final   Neisseria meningitidis NOT DETECTED NOT DETECTED Final   Pseudomonas aeruginosa NOT DETECTED NOT DETECTED Final   Stenotrophomonas maltophilia NOT DETECTED NOT DETECTED Final   Candida albicans NOT DETECTED NOT DETECTED Final   Candida auris NOT DETECTED NOT DETECTED Final   Candida glabrata NOT DETECTED NOT DETECTED Final   Candida krusei NOT DETECTED NOT DETECTED Final   Candida parapsilosis NOT DETECTED NOT DETECTED Final   Candida tropicalis NOT DETECTED NOT DETECTED Final   Cryptococcus neoformans/gattii NOT DETECTED NOT DETECTED Final   CTX-M ESBL NOT DETECTED NOT DETECTED Final   Carbapenem resistance IMP NOT DETECTED NOT DETECTED Final   Carbapenem resistance KPC NOT DETECTED NOT DETECTED Final   Carbapenem resistance NDM NOT DETECTED NOT DETECTED Final   Carbapenem resist OXA 48 LIKE NOT DETECTED NOT DETECTED Final   Carbapenem resistance VIM NOT DETECTED NOT DETECTED Final    Comment: Performed at [facility], [address] [place], [place] [address]  Urine Culture     Status: None   Collection Time: 11/22/22  1:26 PM   Specimen: Urine, Random  Result Value Ref Range Status   Specimen Description   Final    URINE, RANDOM Performed at [facility], [address] [place], [place] [address]    Special Requests   Final    NONE Reflexed from [id number] Performed at [facility], [address], [place], [place] [address]    Culture   Final    NO GROWTH Performed at [facility], [address] [place], [place] [address]    Report Status 11/24/2022 FINAL  Final  SARS Coronavirus 2 by RT PCR (hospital order, performed in [facility] hospital lab) *cepheid single result test* Anterior Nasal Swab     Status: None   Collection Time: 11/22/22  1:40 PM   Specimen: Anterior Nasal Swab  Result Value Ref Range Status   SARS Coronavirus 2 by RT PCR NEGATIVE NEGATIVE Final    Comment: (NOTE) SARS-CoV-2 target nucleic acids are NOT DETECTED.  The SARS-CoV-2 RNA is generally detectable in upper and lower respiratory specimens during the acute phase of infection. The lowest concentration of SARS-CoV-2 viral copies this assay can detect is 250 copies / mL. A negative result does not preclude SARS-CoV-2 infection and should not be used as the sole basis for treatment or other patient management decisions.  A negative result may occur with improper specimen collection / handling, submission of specimen other than nasopharyngeal swab, presence of viral mutation(s) within the areas targeted by this assay, and inadequate number of viral copies (<250 copies / mL). A negative result must be combined with clinical observations, patient history, and epidemiological information.  Fact Sheet for Patients:   RoadLapTop.co.za  Fact Sheet for Healthcare Providers: http://kim-miller.com/  This test is not yet approved or  cleared by the [place] FDA and has been authorized for detection  and/or diagnosis of SARS-CoV-2 by FDA under an Emergency Use Authorization (EUA).  This EUA will remain in effect (meaning this test can be used) for the duration of  the COVID-19 declaration under Section 564(b)(1) of the Act, 21 U.S.C. section 360bbb-3(b)(1), unless the authorization is terminated or revoked sooner.  Performed at [facility], [address] [place], [place] [address]   Culture, blood (Routine X 2) w Reflex to ID Panel     Status: None (Preliminary result)   Collection Time: 11/24/22  3:27 PM   Specimen: BLOOD LEFT HAND  Result Value Ref Range Status   Specimen Description   Final    BLOOD LEFT HAND Performed at [facility], [address] [place], [place] [address]    Special Requests   Final    AEROBIC BOTTLE ONLY Blood Culture adequate volume Performed at [facility], [address], [place], [place] [address]    Culture   Final    NO GROWTH 3 DAYS Performed at [facility], [address] [place], [place] [address]    Report Status PENDING  Incomplete  Culture, blood (Routine X 2) w Reflex to ID Panel     Status: None (Preliminary result)   Collection Time: 11/24/22  3:35 PM   Specimen: BLOOD LEFT ARM  Result Value Ref Range Status   Specimen Description   Final    BLOOD LEFT ARM Performed at [facility], [address] [place], [place] [address]    Special Requests   Final    AEROBIC BOTTLE ONLY Blood Culture adequate volume Performed at [facility], [address] [place], [place] [address]    Culture   Final    NO GROWTH 3 DAYS Performed at [facility], [address] [place], [place] [address]    Report Status PENDING  Incomplete  Surgical pcr screen     Status: Abnormal   Collection Time: 11/25/22  8:22 PM   Specimen: Nasal Mucosa; Nasal Swab  Result Value Ref Range Status   MRSA, PCR NEGATIVE NEGATIVE Final   Staphylococcus aureus POSITIVE (A) NEGATIVE Final    Comment: (NOTE) The Xpert SA Assay (FDA approved for NASAL specimens in patients 70 years of age and older), is one component  of a comprehensive surveillance program. It is not intended to diagnose infection nor to guide or monitor treatment. Performed at [facility], [address] [place], [place] [address]   Aerobic/Anaerobic Culture w Gram Stain (surgical/deep wound)     Status: None (Preliminary result)   Collection Time: 11/26/22  1:11 AM   Specimen: Soft Tissue, Other  Result Value Ref Range Status   Specimen Description OTHER  Final   Special Requests SFT TISS  Final   Gram Stain   Final    NO WBC SEEN NO ORGANISMS SEEN Performed at [facility], [address], [place], [place] [address]    Culture FEW GRAM NEGATIVE RODS  Final   Report Status PENDING  Incomplete  Aerobic/Anaerobic Culture w Gram Stain (surgical/deep wound)     Status: None (Preliminary result)   Collection Time: 11/26/22  1:21 AM   Specimen: Abscess  Result Value Ref Range Status   Specimen Description ABSCESS  Final   Special Requests NONE  Final   Gram Stain   Final    RARE WBC PRESENT, PREDOMINANTLY PMN NO ORGANISMS SEEN Performed at [facility], [address] [place], [place] [address]    Culture FEW GRAM NEGATIVE RODS  Final   Report Status PENDING  Incomplete  Culture, blood (Routine X 2) w Reflex to ID Panel     Status: None (Preliminary result)   Collection Time: 11/26/22  7:46 AM   Specimen: BLOOD  Result Value Ref Range Status   Specimen Description BLOOD BLOOD RIGHT ARM  Final   Special Requests   Final    BOTTLES DRAWN AEROBIC AND ANAEROBIC Blood Culture adequate volume   Culture   Final    NO GROWTH < 24 HOURS Performed at [facility], [address] [place], [place] [address]    Report Status PENDING  Incomplete  Culture, blood (Routine X 2) w Reflex to ID Panel     Status: None (Preliminary result)   Collection Time: 11/26/22  7:46 AM   Specimen: BLOOD  Result Value Ref Range Status   Specimen Description BLOOD BLOOD LEFT HAND  Final   Special Requests   Final     AEROBIC BOTTLE ONLY Blood Culture results may not be optimal due to an inadequate volume of blood received in culture bottles   Culture   Final    NO GROWTH < 24 HOURS Performed at [facility], [address] [place], [place] [address]    Report Status PENDING  Incomplete     Radiology Studies: [place] EKG SITE RITE  Result Date: 11/27/2022 If Site Rite image not attached, placement could not be confirmed due to current cardiac rhythm.  DG Cervical Spine 1 View  Result Date: 11/26/2022 CLINICAL DATA:  ACDF EXAM: DG CERVICAL SPINE - 1 VIEW COMPARISON:  None Available. FINDINGS: The first image shows probe tip at the C6-7 disc space. The second image shows the probe tip at the C5-6 disc space. The third image shows completed C4-6 ACDF. IMPRESSION: Intraoperative fluoroscopy for C4-6 ACDF. Electronically Signed   By: Deatra Robinson M.D.   On: 11/26/2022 02:29    Scheduled Meds:  amLODipine  5 mg Oral Daily   docusate sodium  100 mg Oral BID   guaiFENesin  600 mg Oral BID   ipratropium-albuterol  3 mL Nebulization Q6H   lidocaine  1 patch Transdermal Q24H   losartan  50 mg Oral Daily   mupirocin ointment  1 Application Nasal BID   polyethylene glycol  17 g Oral Daily   senna  1 tablet Oral BID   sodium chloride flush  3 mL Intravenous Q12H   sodium chloride HYPERTONIC  4 mL Nebulization Daily   tamsulosin  0.4 mg Oral Daily   Continuous Infusions:  sodium chloride 250 mL (11/26/22 0900)   cefTRIAXone (ROCEPHIN)  IV 2 g (11/27/22 1037)     LOS: 4 days   Burnadette Pop, MD [facility] P7/31/2024, 1:17 PM

## 2022-11-27 NOTE — Progress Notes (Signed)
PHARMACY CONSULT NOTE FOR:  OUTPATIENT  PARENTERAL ANTIBIOTIC THERAPY (OPAT)  Indication: Cervical spine discitis/osteomyelitis  Regimen: ceftriaxone 2g IV Q24H End date: 01/07/23  IV antibiotic discharge orders are pended. To discharging provider:  please sign these orders via discharge navigator,  Select New Orders & click on the button choice - Manage This Unsigned Work.     Thank you for allowing pharmacy to be a part of this patient's care.  Lendon Ka, PharmD Candidate 11/27/2022, 12:17 PM

## 2022-11-27 NOTE — Plan of Care (Signed)
  Problem: Education: Goal: Knowledge of General Education information will improve Description: Including pain rating scale, medication(s)/side effects and non-pharmacologic comfort measures Outcome: Progressing   Problem: Nutrition: Goal: Adequate nutrition will be maintained Outcome: Progressing   Problem: Elimination: Goal: Will not experience complications related to bowel motility Outcome: Progressing Goal: Will not experience complications related to urinary retention Outcome: Progressing   Problem: Pain Managment: Goal: General experience of comfort will improve Outcome: Progressing   Problem: Safety: Goal: Ability to remain free from injury will improve Outcome: Progressing

## 2022-11-27 NOTE — Plan of Care (Signed)

## 2022-11-27 NOTE — Progress Notes (Addendum)
Abx: 7/27-c ceftriaxone  7/27-29 vanc 7/30 cefazolin periop  Diagnosis: Ecoli bacteremia -- unclear source Cervical spine om -- s/p acd c4-5 and c5-6 arthrodesis with structural fixation and allograft anterior plate fixation X9-1 on 7/30  Culture Result: 7/27 bcx and 7/30 operative cx ecoli (S cipro, amp-sulb; no cefazolin card to reflect new mic cut off </=2; R amp, bactrim)   Repeat bcx 7/28 and 7/30 ngtd   7/27 tte with moderate ar but suspect age related valve issue rather than endocarditis. This is a very quick onset of sx and very low risk for IE from gram negative so I do not feel IE w/u warranted with tee. However, I am not opposed if primary team want to pursue and will defer to them   Shoulders imaging reviewed -- clinically not consistent with septic arthritis   OPAT Orders Discharge antibiotics to be given via PICC line Discharge antibiotics: Ceftriaxone 2 gm iv   Duration: 6 weeks End Date: 7/30 -   Home health RN for IV administration and teaching; PICC line care and labs.    Labs weekly while on IV antibiotics: _x_ CBC with differential __ BMP _x_ CMP _x_ CRP __ ESR __ Vancomycin trough __ CK  _x_ Please pull PIC at completion of IV antibiotics __ Please leave PIC in place until doctor has seen patient or been notified  Fax weekly labs to [phone]  Clinic Follow Up Appt: 8/22 @ 245 with dr Renold Don  @  [facility] [address], [place], [place] [address] Phone: [phone]   --------------- Subjective: Some dry cough and upper anterior chest pain Difficult to urinate and feels like retention Minimal neck pain No ext numbness/weakness  Reviewed hx. No clear source for ecoli bacteremia    Objective: Vitals:   11/27/22 0722 11/27/22 1104  BP: (!) 159/81 (!) 160/76  Pulse: 71 81  Resp:    Temp: 98.3 F (36.8 C) 98.6 F (37 C)  SpO2: 95% 94%   General/constitutional: no distress, pleasant HEENT: Normocephalic, PER,  Conj Clear, EOMI, Oropharynx clear Neck supple -- anterior incision no dehiscence, but only mild swelling CV: rrr no mrg Lungs: clear to auscultation, normal respiratory effort Abd: Soft, Nontender Ext: no edema Skin: No Rash Neuro: nonfocal MSK: no peripheral joint swelling/tenderness/warmth; back spines nontender    Labs: Lab Results  Component Value Date   WBC 11.1 (H) 11/27/2022   HGB 10.8 (L) 11/27/2022   HCT 32.4 (L) 11/27/2022   MCV 84.4 11/27/2022   PLT 235 11/27/2022   Last metabolic panel Lab Results  Component Value Date   GLUCOSE 106 (H) 11/27/2022   NA 133 (L) 11/27/2022   K 3.3 (L) 11/27/2022   CL 100 11/27/2022   CO2 23 11/27/2022   BUN 12 11/27/2022   CREATININE 0.73 11/27/2022   GFRNONAA >60 11/27/2022   CALCIUM 7.7 (L) 11/27/2022   PHOS 4.1 11/23/2022   PROT 5.6 (L) 11/25/2022   ALBUMIN 2.2 (L) 11/25/2022   BILITOT 1.0 11/25/2022   ALKPHOS 238 (H) 11/25/2022   AST 26 11/25/2022   ALT 28 11/25/2022   ANIONGAP 10 11/27/2022   Imaging: Reviewed   7/28 mri c spine 1. 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 2. Extensive dural enhancement extends from the C1-2 level through T1-2 posteriorly. This is most prominent from C2-3 through C7.  3. Increased T2 signal is present within the cervical spinal cord at C4 and C5 consistent with spinal cord edema. 4. Prevertebral edema and enhancement extends from the C1-2 level through C6. No extension into the mediastinum. 5. Severe left foraminal stenosis at C4-5. 6. Moderate foraminal stenosis bilaterally at C5-6. 7. Mild right foraminal stenosis at C3-4. 8. Mild posterior dural enhancement at C7-T1 without significant central canal stenosis. 9. Abnormal enhancement extends into the foramina bilaterally at C4-5, C5-6, and C6-7.   7/28 mri left shoulder 1. Small glenohumeral joint effusion with  relatively mild synovitis. This is favored to be reactive although septic arthritis is not entirely excluded in the setting of bacteremia. 2. Full-thickness, near-complete tear of the subscapularis tendon with minimal retraction. 3. Severe supraspinatus and infraspinatus tendinosis with high-grade partial-thickness tear along the anterior leading edge of the distal supraspinatus tendon. 4. Medial dislocation of the long head biceps tendon out of the bicipital groove. 5. Moderate AC joint osteoarthritis.    7/28 mri right shoulder  1. Small glenohumeral joint effusion with relatively mild synovitis. This is favored to be reactive although septic arthritis is not entirely excluded in the setting of bacteremia. 2. Full-thickness, near-complete tear of the distal supraspinatus tendon with retraction to the level of the humeral head apex. 3. Partial-thickness tearing of the distal subscapularis tendon. 4. Intra-articular biceps tendinosis.    7/27 cta chest No definite evidence of pulmonary embolus.   Mild urinary bladder distention is noted.   Coronary artery calcifications are noted.     7/27 ct abd pelv with contrast CT ABDOMEN and PELVIS FINDINGS   Hepatobiliary: No focal liver abnormality is seen. No gallstones, gallbladder wall thickening, or biliary dilatation.   Pancreas: Unremarkable. No pancreatic ductal dilatation or surrounding inflammatory changes.   Spleen: Normal in size without focal abnormality.   Adrenals/Urinary Tract: Adrenal glands appear normal. Left renal cysts are noted for which no further follow-up is required. No hydronephrosis or renal obstruction is noted. Mild urinary bladder distention is noted.   Stomach/Bowel: Stomach is within normal limits. Appendix appears normal. No evidence of bowel wall thickening, distention, or inflammatory changes.   Vascular/Lymphatic: Aortic atherosclerosis. No enlarged abdominal or pelvic lymph nodes.    Reproductive: Prostate is unremarkable.   Other: No abdominal wall hernia or abnormality. No abdominopelvic ascites.   Musculoskeletal: No acute or significant osseous findings.   7/27 tte  1. Left ventricular ejection fraction, by estimation, is 55 to 60%. The  left ventricle has normal function. The left ventricle has no regional  wall motion abnormalities. Left ventricular diastolic parameters were  normal.   2. Right ventricular systolic function is normal. The right ventricular  size is normal. There is mildly elevated pulmonary artery systolic  pressure. The estimated right ventricular systolic pressure is 38.7 mmHg.   3. Left atrial size was mildly dilated.   4. The mitral valve is normal in structure. Mild mitral valve  regurgitation. No evidence of mitral stenosis.   5. The aortic valve is tricuspid. There is mild thickening of the aortic  valve. Aortic valve regurgitation is moderate. No aortic stenosis is  present.   6. 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   7. The inferior vena cava is normal in size with <50% respiratory  variability, suggesting right atrial pressure of 8 mmHg.   Comparison(s): Regurgitant valve lesions raise concern for endocarditis in  setting of bacteremia, consider TEE if clinically indicated.

## 2022-11-27 NOTE — Progress Notes (Signed)
Discuss with Dr. Renford Dills and Dr. Renold Don, patient does not need TEE at this time. Will cancel TEE.

## 2022-11-27 NOTE — Progress Notes (Signed)
Pt voided postop. Pt not voiding 6 hrs later @0058 , bladder scanned 970 ml and I & O cath 750 ml.. Pt still not voiding 0600. Bladder scan shows 464 ml and I & O cath 550 ml.

## 2022-11-27 NOTE — Plan of Care (Signed)
  Problem: Health Behavior/Discharge Planning: Goal: Ability to manage health-related needs will improve Outcome: Progressing   

## 2022-11-28 ENCOUNTER — Other Ambulatory Visit (INDEPENDENT_AMBULATORY_CARE_PROVIDER_SITE_OTHER): Payer: 59

## 2022-11-28 ENCOUNTER — Telehealth: Payer: Self-pay | Admitting: Cardiology

## 2022-11-28 DIAGNOSIS — B9620 Unspecified Escherichia coli [E. coli] as the cause of diseases classified elsewhere: Secondary | ICD-10-CM | POA: Diagnosis not present

## 2022-11-28 DIAGNOSIS — I480 Paroxysmal atrial fibrillation: Secondary | ICD-10-CM

## 2022-11-28 DIAGNOSIS — R7881 Bacteremia: Secondary | ICD-10-CM | POA: Diagnosis not present

## 2022-11-28 LAB — CBC WITH DIFFERENTIAL/PLATELET
Abs Immature Granulocytes: 0.08 10*3/uL — ABNORMAL HIGH (ref 0.00–0.07)
Basophils Absolute: 0 10*3/uL (ref 0.0–0.1)
Basophils Relative: 1 %
Eosinophils Absolute: 0.3 10*3/uL (ref 0.0–0.5)
Eosinophils Relative: 3 %
HCT: 38.3 % — ABNORMAL LOW (ref 39.0–52.0)
Hemoglobin: 12.7 g/dL — ABNORMAL LOW (ref 13.0–17.0)
Immature Granulocytes: 1 %
Lymphocytes Relative: 12 %
Lymphs Abs: 1.1 10*3/uL (ref 0.7–4.0)
MCH: 28.2 pg (ref 26.0–34.0)
MCHC: 33.2 g/dL (ref 30.0–36.0)
MCV: 84.9 fL (ref 80.0–100.0)
Monocytes Absolute: 0.6 10*3/uL (ref 0.1–1.0)
Monocytes Relative: 7 %
Neutro Abs: 6.5 10*3/uL (ref 1.7–7.7)
Neutrophils Relative %: 76 %
Platelets: 408 10*3/uL — ABNORMAL HIGH (ref 150–400)
RBC: 4.51 MIL/uL (ref 4.22–5.81)
RDW: 13.4 % (ref 11.5–15.5)
WBC: 8.5 10*3/uL (ref 4.0–10.5)
nRBC: 0 % (ref 0.0–0.2)

## 2022-11-28 LAB — RENAL FUNCTION PANEL
Albumin: 2.1 g/dL — ABNORMAL LOW (ref 3.5–5.0)
Anion gap: 10 (ref 5–15)
BUN: 13 mg/dL (ref 8–23)
CO2: 24 mmol/L (ref 22–32)
Calcium: 8.4 mg/dL — ABNORMAL LOW (ref 8.9–10.3)
Chloride: 99 mmol/L (ref 98–111)
Creatinine, Ser: 0.77 mg/dL (ref 0.61–1.24)
GFR, Estimated: >60 mL/min (ref >60)
Glucose, Bld: 123 mg/dL — ABNORMAL HIGH (ref 70–99)
Phosphorus: 3 mg/dL (ref 2.5–4.6)
Potassium: 4.2 mmol/L (ref 3.5–5.1)
Sodium: 133 mmol/L — ABNORMAL LOW (ref 135–145)

## 2022-11-28 LAB — TROPONIN I (HIGH SENSITIVITY)
Troponin I (High Sensitivity): 24 ng/L — ABNORMAL HIGH (ref <18)
Troponin I (High Sensitivity): 24 ng/L — ABNORMAL HIGH (ref <18)

## 2022-11-28 LAB — MAGNESIUM: Magnesium: 2.1 mg/dL (ref 1.7–2.4)

## 2022-11-28 LAB — D-DIMER, QUANTITATIVE: D-Dimer, Quant: 3.09 ug/mL-FEU — ABNORMAL HIGH (ref 0.00–0.50)

## 2022-11-28 LAB — T4, FREE: Free T4: 1.44 ng/dL — ABNORMAL HIGH (ref 0.61–1.12)

## 2022-11-28 LAB — TSH: TSH: 1.2 u[IU]/mL (ref 0.350–4.500)

## 2022-11-28 MED ORDER — CHLORHEXIDINE GLUCONATE CLOTH 2 % EX PADS
6.0000 | MEDICATED_PAD | Freq: Every day | CUTANEOUS | Status: DC
  Administered 2022-11-28 – 2022-11-29 (×2): 6 via TOPICAL

## 2022-11-28 MED ORDER — METOPROLOL TARTRATE 5 MG/5ML IV SOLN
5.0000 mg | Freq: Once | INTRAVENOUS | Status: AC
  Administered 2022-11-28: 5 mg via INTRAVENOUS

## 2022-11-28 MED ORDER — SODIUM CHLORIDE 0.9 % IV SOLN: INTRAVENOUS | Status: DC | PRN

## 2022-11-28 MED ORDER — SODIUM CHLORIDE 0.9% FLUSH
10.0000 mL | Freq: Two times a day (BID) | INTRAVENOUS | Status: DC
  Administered 2022-11-28 – 2022-11-29 (×3): 10 mL

## 2022-11-28 MED ORDER — SODIUM CHLORIDE 0.9% FLUSH: 10.0000 mL | INTRAVENOUS | Status: DC | PRN

## 2022-11-28 MED ORDER — SODIUM CHLORIDE 0.9 % IV SOLN: 250.0000 mL | INTRAVENOUS | Status: DC | PRN

## 2022-11-28 MED ORDER — CEFTRIAXONE IV (FOR PTA / DISCHARGE USE ONLY): 2.0000 g | INTRAVENOUS | 0 refills | Status: AC

## 2022-11-28 MED ORDER — DILTIAZEM HCL-DEXTROSE 125-5 MG/125ML-% IV SOLN (PREMIX)
5.0000 mg/h | INTRAVENOUS | Status: DC
  Administered 2022-11-28: 5 mg/h via INTRAVENOUS
  Filled 2022-11-28: qty 125

## 2022-11-28 NOTE — Plan of Care (Signed)
Alert and oriented. Went into Afib with RVR today, converted back to NSR after diltazem dose and metoprolol IV push, see MAR for details. PICC line placed by IV team. Possible discharge home tomorrow with home infusion.   Problem: Education: Goal: Knowledge of General Education information will improve Description: Including pain rating scale, medication(s)/side effects and non-pharmacologic comfort measures Outcome: Progressing   Problem: Health Behavior/Discharge Planning: Goal: Ability to manage health-related needs will improve Outcome: Progressing   Problem: Clinical Measurements: Goal: Ability to maintain clinical measurements within normal limits will improve Outcome: Progressing Goal: Will remain free from infection Outcome: Progressing Goal: Diagnostic test results will improve Outcome: Progressing   Problem: Elimination: Goal: Will not experience complications related to bowel motility Outcome: Progressing Goal: Will not experience complications related to urinary retention Outcome: Progressing

## 2022-11-28 NOTE — TOC Progression Note (Signed)
Transition of Care [facility]) - Progression Note    Patient Details  Name: Frederick Andrews MRN: [id number] Date of Birth: [birth year]  [facility] CM/SW Contact  Kermit Balo, RN Phone Number: 11/28/2022, 12:05 PM  Clinical Narrative:    Julianne Rice will provide antibiotics to the home and home health RN.  Pt to d/c home today but went into Afib after PICC placement.  TOC following.    Expected Discharge Plan: Home w Home Health Services Barriers to Discharge: Continued Medical Work up  Expected Discharge Plan and Services   Discharge Planning Services: CM Consult   Living arrangements for the past 2 months: Single Family Home                                       Social Determinants of Health (SDOH) Interventions SDOH Screenings   Food Insecurity: No Food Insecurity (11/25/2022)  Housing: Low Risk  (11/25/2022)  Transportation Needs: No Transportation Needs (11/25/2022)  Utilities: Not At Risk (11/25/2022)  Financial Resource Strain: Low Risk  (11/20/2022)   Received from [facility]  Physical Activity: Insufficiently Active (06/06/2022)   Received from [facility]  Social Connections: Socially Integrated (06/06/2022)   Received from [facility]  Stress: No Stress Concern Present (06/06/2022)   Received from [facility]  Tobacco Use: High Risk (11/26/2022)    Readmission Risk Interventions     No data to display

## 2022-11-28 NOTE — Progress Notes (Signed)
MEWS Progress Note  Patient Details Name: Beniamin Calliste MRN: [id number] DOB: 1950/05/27 Today's Date: 11/28/2022   MEWS Flowsheet Documentation:  Assess: MEWS Score Temp: 98.2 F (36.8 C) BP: (!) 151/135 MAP (mmHg): 142 Pulse Rate: (!) 138 ECG Heart Rate: (!) 136 Resp: 14 Level of Consciousness: Alert SpO2: 99 % O2 Device: Room Air Patient Activity (if Appropriate): In bed O2 Flow Rate (L/min): 2 L/min Assess: MEWS Score MEWS Temp: 0 MEWS Systolic: 0 MEWS Pulse: 3 MEWS RR: 0 MEWS LOC: 0 MEWS Score: 3 MEWS Score Color: Yellow Assess: SIRS CRITERIA SIRS Temperature : 0 SIRS Respirations : 0 SIRS Pulse: 1 SIRS WBC: 0 SIRS Score Sum : 1 SIRS Temperature : 0 SIRS Pulse: 1 SIRS Respirations : 0 SIRS WBC: 0 SIRS Score Sum : 1 Assess: if the MEWS score is Yellow or Red Were vital signs accurate and taken at a resting state?: Yes Does the patient meet 2 or more of the SIRS criteria?: No MEWS guidelines implemented : Yes, yellow Treat MEWS Interventions: Considered administering scheduled or prn medications/treatments as ordered Take Vital Signs Increase Vital Sign Frequency : Yellow: Q2hr x1, continue Q4hrs until patient remains green for 12hrs Escalate MEWS: Escalate: Yellow: Discuss with charge nurse and consider notifying provider and/or RRT Notify: Charge Nurse/RN Name of Charge Nurse/RN Notified: Arthor Captain RN Provider Notification Provider Name/Title: Dr. Dartha Lodge Attending Date Provider Notified: 11/28/22 Time Provider Notified: 1042 Method of Notification: Face-to-face, Rounds Notification Reason: New onset of dysrhythmia Provider response: At bedside Date of Provider Response: 11/28/22 Time of Provider Response: 1042   Kallie Locks 11/28/2022, 11:10 AM

## 2022-11-28 NOTE — Progress Notes (Signed)
Vital signs are stable Patient is feeling better slowly He reports some difficulty with swallowing with lump in throat but otherwise tolerating activity well Neurologically he is intact Swelling in front of neck to be expected -Advised ice packs for this area He can be discharged home and I will follow him up in about 3 weeks in my office.

## 2022-11-28 NOTE — Telephone Encounter (Signed)
Patient with plans to DC soon. Admitted for sepsis and had 2 relatively short occurrences of Afib. Ordering live zio monitor to be mailed to patient. Patient does not speak english but lives with son who does and has daughter who also speaks fluent english and also lives nearby. This correspondence will be sent to Andee Lineman to help facilitate. I will schedule follow up with APP in about 4 weeks to ensure follow up APP will have results.   Dr. Jacques Navy to read zio results.

## 2022-11-28 NOTE — Progress Notes (Unsigned)
Enrolled for [employer] to mail a ZIO AT Live Telemetry monitor to patients address on file.   Dr. Margaretann Loveless to read.

## 2022-11-28 NOTE — Consult Note (Addendum)
Cardiology Consultation   Frederick Andrews ID: Frederick Andrews MRN: [id number]; DOB: 02-20-1951  Admit date: 11/23/2022 Date of Consult: 11/28/2022  PCP:  Frederick Andrews, No Pcp Per    [facility] Providers Cardiologist:  Parke Poisson, MD   Frederick Andrews Profile:   Frederick Andrews is a 72 y.o. Andrews with a hx of hypertension, hyperlipidemia, nicotine dependence who is being seen 11/28/2022 for the evaluation of atrial fibrillation at the request of Dr. Dartha Lodge.  History of Present Illness:  Of note: Frederick Andrews does not speak [place].  All translation was through his daughter.  Frederick Andrews Frederick Andrews with no previous significant cardiac history or family history.  Denies any drug use.  Does smoke unclear exactly how much since Frederick Andrews hides from family.  Lives at home with his son.  His daughter is accompanied with him today who speaks fluent [place].  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  He saw his primary care provider who initially prescribed him pain medications for his shoulder.  He had followed up with his primary care provider who had noted Frederick Andrews to be hypotensive and tachycardic and was referred to the emergency room.  On admission to the emergency room, workup revealed positive blood cultures.  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  Frederick Andrews was urgently transferred to [facility] for emergent anterior cervical decompression C4-C5, C5-C6 arthrodesis with distal stable fixation and allograft anterior plate fixation of C4-C6 by Dr. Danielle Dess.  No evidence of septic arthritis.  Frederick Andrews has been placed on IV antibiotics per primary team.  Cardiology has been consulted due to new onset of atrial fibrillation. On admission Frederick Andrews was in sinus tachycardia.    Frederick Andrews has had 2 occurrences of atrial fibrillation.  The first being very brief episode for about  15 minutes.  Second episode was sustained for about 1-1/2 hours.  Frederick Andrews was started on diltiazem drip and eventually converted back to normal sinus tachycardia, heart rate 110 now heart rates in the 80s to 90s.  Overall, Frederick Andrews asymptomatic with no complaints of dizziness, palpitations, shortness of breath, chest pain, peripheral edema.  Reports no exertional dyspnea or fatigue prior to admission.  Echocardiogram shows preserved EF 55 to 60%.  Mildly elevated RVSP 30.7.  Moderate AR.  Severe PR.  Sodium 133.  Potassium 2.3.  WBC 11.1.  Hemoglobin 10.8.  CRP 15.  Chest x-ray bibasilar segmental atelectasis.  CTA of the chest showed no evidence of pulmonary embolism.  Did show coronary artery calcifications.  Past Medical History:  Diagnosis Date   Hyperlipemia    Hypertension     Past Surgical History:  Procedure Laterality Date   ANTERIOR CERVICAL DECOMPRESSION FOR EPIDURAL ABSCESS N/A 11/25/2022   Procedure: ANTERIOR CERVICAL DECOMPRESSION FUSION FOR EPIDURAL ABSCESS CERVICAL FOUR-FIVE AND CERVICAL FIVE-SIX;  Surgeon: Barnett Abu, MD;  Location: [facility] OR;  Service: Neurosurgery;  Laterality: N/A;       Inpatient Medications: Scheduled Meds:  Chlorhexidine Gluconate Cloth  6 each Topical Daily   docusate sodium  100 mg Oral BID   guaiFENesin  600 mg Oral BID   lidocaine  1 patch Transdermal Q24H   losartan  50 mg Oral Daily   mupirocin ointment  1 Application Nasal BID   polyethylene glycol  17 g Oral Daily   senna  1 tablet Oral BID   sodium chloride flush  10-40 mL  Intracatheter Q12H   sodium chloride HYPERTONIC  4 mL Nebulization Daily   tamsulosin  0.4 mg Oral Daily   Continuous Infusions:  sodium chloride 10 mL/hr at 11/28/22 1305   cefTRIAXone (ROCEPHIN)  IV Stopped (11/28/22 1231)   diltiazem (CARDIZEM) infusion 2.5 mg/hr (11/28/22 1305)   PRN Meds: sodium chloride, acetaminophen **OR** acetaminophen, alum & mag hydroxide-simeth **AND** lidocaine, bisacodyl, hydrALAZINE,  ipratropium-albuterol, ketorolac, labetalol, menthol-cetylpyridinium **OR** phenol, methocarbamol, nicotine, ondansetron **OR** ondansetron (ZOFRAN) IV, oxyCODONE, sodium chloride flush, sodium phosphate  Allergies:   No Known Allergies  Social History:   Social History   Socioeconomic History   Marital status: Married    Spouse name: Not on file   Number of children: Not on file   Years of education: Not on file   Highest education level: Not on file  Occupational History   Not on file  Tobacco Use   Smoking status: Every Day    Current packs/day: 1.00    Average packs/day: 1 pack/day for 40.0 years (40.0 ttl pk-yrs)    Types: Cigarettes   Smokeless tobacco: Not on file   Tobacco comments:    Working on decreasing the number of cigarettes per day.  Substance and Sexual Activity   Alcohol use: No    Alcohol/week: 0.0 standard drinks of alcohol   Drug use: No   Sexual activity: Not on file  Other Topics Concern   Not on file  Social History Narrative   Not on file   Social Determinants of Health   Financial Resource Strain: Low Risk  (11/20/2022)   Received from [facility]   Overall Financial Resource Strain (CARDIA)    Difficulty of Paying Living Expenses: Not hard at all  Food Insecurity: No Food Insecurity (11/25/2022)   Hunger Vital Sign    Worried About Running Out of Food in the Last Year: Never true    Ran Out of Food in the Last Year: Never true  Transportation Needs: No Transportation Needs (11/25/2022)   PRAPARE - Administrator, Civil Service (Medical): No    Lack of Transportation (Non-Medical): No  Physical Activity: Insufficiently Active (06/06/2022)   Received from [facility]   Exercise Vital Sign    Days of Exercise per Week: 2 days    Minutes of Exercise per Session: 10 min  Stress: No Stress Concern Present (06/06/2022)   Received from [facility] - Occupational Stress Questionnaire     Feeling of Stress : Only a little  Social Connections: Socially Integrated (06/06/2022)   Received from [facility]   Social Network    How would you rate your social network (family, work, friends)?: Good participation with social networks  Intimate Partner Violence: Not At Risk (11/25/2022)   Humiliation, Afraid, Rape, and Kick questionnaire    Fear of Current or Ex-Partner: No    Emotionally Abused: No    Physically Abused: No    Sexually Abused: No    Family History:    Family History  Problem Relation Age of Onset   Heart disease Mother    Hypertension Mother    Hyperlipidemia Mother    Hyperlipidemia Other      ROS:  Please see the history of present illness.  All other ROS reviewed and negative.     Physical Exam/Data:   Vitals:   11/28/22 1215 11/28/22 1230 11/28/22 1315 11/28/22 1330  BP: 92/73 118/76 (!) 148/127 (!) 121/59  Pulse: 95  Resp: 17 (!) 22 (!) 21 (!) 22  Temp: 98.9 F (37.2 C)     TempSrc: Oral     SpO2: 98%     Weight:      Height:        Intake/Output Summary (Last 24 hours) at 11/28/2022 1447 Last data filed at 11/28/2022 1305 Gross per 24 hour  Intake 221.62 ml  Output 1700 ml  Net -1478.38 ml      11/23/2022    9:10 AM 11/22/2022   11:26 AM 11/19/2014   12:39 PM  Last 3 Weights  Weight (lbs) 121 lb 4.1 oz 123 lb 0.3 oz 123 lb  Weight (kg) 55 kg 55.8 kg 55.792 kg     Body mass index is 21.48 kg/m.  General:  Well nourished, well developed, in no acute distress HEENT: normal Neck: no JVD Vascular: No carotid bruits; Distal pulses 2+ bilaterally Cardiac:  normal S1, S2; RRR; no murmur  Lungs:  clear to auscultation bilaterally, no wheezing, rhonchi or rales  Abd: soft, nontender, no hepatomegaly  Ext: no edema Musculoskeletal:  No deformities, BUE and BLE strength normal and equal Skin: warm and dry  Neuro:  CNs 2-12 intact, no focal abnormalities noted Psych:  Normal affect   EKG:  The EKG was personally reviewed and  demonstrates:  Initial EKG shows atrial fibrillation, heart rate 158. Telemetry:  Telemetry was personally reviewed and demonstrates: Maintaining normal sinus rhythm heart rate 80s now.  Had about 1/2-hour episode of atrial fibrillation today at approximately 10 45-12 15.  Had another brief bout earlier this morning around 6 AM.  Relevant CV Studies: Echocardiogram 11/23/2022  1. Left ventricular ejection fraction, by estimation, is 55 to 60%. The  left ventricle has normal function. The left ventricle has no regional  wall motion abnormalities. Left ventricular diastolic parameters were  normal.   2. Right ventricular systolic function is normal. The right ventricular  size is normal. There is mildly elevated pulmonary artery systolic  pressure. The estimated right ventricular systolic pressure is 38.7 mmHg.   3. Left atrial size was mildly dilated.   4. The mitral valve is normal in structure. Mild mitral valve  regurgitation. No evidence of mitral stenosis.   5. The aortic valve is tricuspid. There is mild thickening of the aortic  valve. Aortic valve regurgitation is moderate. No aortic stenosis is  present.   6. 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   7. The inferior vena cava is normal in size with <50% respiratory  variability, suggesting right atrial pressure of 8 mmHg.   Comparison(s): Regurgitant valve lesions raise concern for endocarditis in  setting of bacteremia, consider TEE if clinically indicated.   Laboratory Data:  High Sensitivity Troponin:   Recent Labs  Lab 11/28/22 1258  TROPONINIHS 24*     Chemistry Recent Labs  Lab 11/25/22 0530 11/26/22 0549 11/27/22 0220 11/28/22 1258  NA 136 136 133* 133*  K 3.1* 4.0 3.3* 4.2  CL 104 102 100 99  CO2 24 21* 23 24  GLUCOSE 145* 116* 106* 123*  BUN 22 15 12 13   CREATININE 0.76 0.83 0.73 0.77  CALCIUM 7.5* 7.8* 7.7* 8.4*  MG 2.2 2.0  --  2.1  GFRNONAA >60 >60 >60 >60  ANIONGAP 8 13 10 10     Recent Labs   Lab 11/23/22 1400 11/24/22 0637 11/25/22 0530 11/28/22 1258  PROT 6.6 6.0* 5.6*  --   ALBUMIN 2.6* 2.4* 2.2* 2.1*  AST 25 27 26   --  ALT 29 28 28   --   ALKPHOS 157* 184* 238*  --   BILITOT 1.5* 1.6* 1.0  --    Lipids No results for input(s): "CHOL", "TRIG", "HDL", "LABVLDL", "LDLCALC", "CHOLHDL" in the last 168 hours.  Hematology Recent Labs  Lab 11/26/22 0549 11/27/22 0220 11/28/22 1258  WBC 13.1* 11.1* 8.5  RBC 4.08* 3.84* 4.51  HGB 11.5* 10.8* 12.7*  HCT 35.2* 32.4* 38.3*  MCV 86.3 84.4 84.9  MCH 28.2 28.1 28.2  MCHC 32.7 33.3 33.2  RDW 13.8 13.6 13.4  PLT 198 235 408*   Thyroid  Recent Labs  Lab 11/28/22 1258  TSH 1.200  FREET4 1.44*    BNPNo results for input(s): "BNP", "PROBNP" in the last 168 hours.  DDimer  Recent Labs  Lab 11/28/22 1258  DDIMER 3.09*     Radiology/Studies:  DG CHEST PORT 1 VIEW  Result Date: 11/27/2022 CLINICAL DATA:  Shortness of breath. EXAM: PORTABLE CHEST 1 VIEW COMPARISON:  November 22, 2022.  November 23, 2022. FINDINGS: Stable cardiomegaly. Mild bibasilar subsegmental atelectasis is noted. The visualized skeletal structures are unremarkable. IMPRESSION: Mild bibasilar subsegmental atelectasis. Electronically Signed   By: Lupita Raider M.D.   On: 11/27/2022 14:16   [place] EKG SITE RITE  Result Date: 11/27/2022 If Site Rite image not attached, placement could not be confirmed due to current cardiac rhythm.  DG Cervical Spine 1 View  Result Date: 11/26/2022 CLINICAL DATA:  ACDF EXAM: DG CERVICAL SPINE - 1 VIEW COMPARISON:  None Available. FINDINGS: The first image shows probe tip at the C6-7 disc space. The second image shows the probe tip at the C5-6 disc space. The third image shows completed C4-6 ACDF. IMPRESSION: Intraoperative fluoroscopy for C4-6 ACDF. Electronically Signed   By: Deatra Robinson M.D.   On: 11/26/2022 02:29     Assessment and Plan:   New onset paroxysmal atrial fibrillation Initially presented in sinus  tachycardia and had short episode of atrial fibrillation earlier this morning ( ).  Then had another sustained episode for 1 and half hours, started on IV diltiazem and then converted back to sinus tachycardia, now normal sinus rhythm with heart rates in the 80s to 90s.  He does have an elevated CHA2DS2-VASc score of 3 which would indicate anticoagulation however, given 2 relatively short bouts of atrial fibrillation, question the need for anticoagulation at this time.  In the setting of sepsis this may be brief occurrences.  Frederick Andrews has mildly dilated left atria which could possibly support short-term chronicity.  Given such, we will plan for heart monitor for 2 weeks to evaluate reoccurrence/atrial fibrillation burden.  Per neurosurgery, okay to start anticoagulation if needed.  Place 2-week heart monitor to be mailed at DC.  Would defer anticoagulation for now until more evidence of atrial fibrillation can be determined/assess burden. Echocardiogram shows preserved EF 55 to 60%.  RVSP 38.7.  Severe PR. TSH normal. 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 Will need follow up echocardiogram in 6 months. If TEE is thought to be needed, okay per neurosurgery.  Abx per primary team   CAD Coronary calcifications noted on CTA.  Continue to follow and modify secondary risk factors. Check LDL.  Hypertension Continue losartan 50 mg  Nicotine dependence Currently on nicotine patch.  Counseling provided.   Risk Assessment/Risk Scores:  CHA2DS2-VASc Score = 3   :1} This indicates a 3.2% annual risk of stroke. The Frederick Andrews's score is based upon: CHF History: 0 HTN History: 1 Diabetes History: 0 Stroke History: 0 Vascular Disease History: 1 Age Score: 1 Gender Score: 0     For questions  or updates, please contact [facility] Please consult [email or web address] for contact info under    Signed, Abagail Kitchens, PA-C  11/28/2022 2:47 PM   Frederick Andrews seen and examined with Yvonna Alanis PA-C.  Agree as above, with the following exceptions and changes as noted below.  72 year old Andrews currently admitted for management of bacteremia/sepsis with identification of cervical spine ventral epidural abscesses, urgently transferred to [facility] for neurosurgery where he underwent anterior cervical decompression with distal stable fixation and allograft anterior plate fixation of C4-C6 with Dr. Danielle Dess.  Echocardiogram remarkable for moderate aortic valve regurgitation and severe pulmonary valve regurgitation, this is the Frederick Andrews's first echocardiogram unclear on duration of this finding.  Cardiology consulted for 2 brief episodes of supraventricular tachycardia felt to represent atrial flutter with 2-1 conduction versus rapid atrial fibrillation.  Gen: NAD, CV: RRR, soft precordial diastolic murmur, JVP notable for V wave to lower one third of the neck lungs: clear, Abd: soft, Extrem: Warm, well perfused, no edema, Neuro/Psych: alert and oriented x 3, normal mood and affect. All available labs, radiology testing, previous records reviewed.  Frederick Andrews seen and examined with the iPad interpreter service.  Frederick Andrews has no significant questions and understands our discussion about brief episode of atrial fibrillation.  He certainly has a nidus for further atrial fibrillation including valvular lesions with significant pulmonary valve regurgitation and evidence of V wave on evaluation of the jugular veins, as well as infection and recent surgery.  We will monitor his telemetry overnight and if no recurrence of atrial fibrillation, will plan for outpatient cardiac monitor to screen for atrial fibrillation.  Given the brief episode and recent neurosurgery, would defer anticoagulation unless episodes are more  sustained or recur.  Neurosurgery has cleared the Frederick Andrews to receive anticoagulation and we appreciate their guidance.  Primary service inquires whether starting a beta-blocker would be helpful, no contraindication to low-dose beta-blocker, if Frederick Andrews is felt to be stable from a infection standpoint.  Hemodynamically stable at this time, cardiology will follow as needed.  Parke Poisson, MD 11/28/22 4:27 PM

## 2022-11-28 NOTE — Progress Notes (Signed)
Peripherally Inserted Central Catheter Placement  The IV Nurse has discussed with the patient and/or persons authorized to consent for the patient, the purpose of this procedure and the potential benefits and risks involved with this procedure.  The benefits include less needle sticks, lab draws from the catheter, and the patient may be discharged home with the catheter. Risks include, but not limited to, infection, bleeding, blood clot (thrombus formation), and puncture of an artery; nerve damage and irregular heartbeat and possibility to perform a PICC exchange if needed/ordered by physician.  Alternatives to this procedure were also discussed.  Bard Power PICC patient education guide, fact sheet on infection prevention and patient information card has been provided to patient /or left at bedside.    PICC Placement Documentation  PICC Single Lumen 11/28/22 Right Brachial 37 cm 0 cm (Active)  Indication for Insertion or Continuance of Line Home intravenous therapies (PICC only) 11/28/22 0848  Exposed Catheter (cm) 0 cm 11/28/22 0848  Site Assessment Clean, Dry, Intact 11/28/22 0848  Line Status Flushed;Blood return noted;Saline locked 11/28/22 0848  Dressing Type Transparent 11/28/22 0848  Dressing Status Antimicrobial disc in place 11/28/22 0848  Safety Lock Not Applicable 11/28/22 0848  Line Care Connections checked and tightened 11/28/22 0848  Line Adjustment (NICU/IV Team Only) No 11/28/22 0848  Dressing Intervention New dressing 11/28/22 0848  Dressing Change Due 12/05/22 11/28/22 0848   Daughter signed consent.    Audrie Gallus 11/28/2022, 8:51 AM

## 2022-11-28 NOTE — Progress Notes (Signed)
PROGRESS NOTE  Frederick Andrews  WUJ:[id number] DOB: 12-02-1950 DOA: 11/23/2022 PCP: Patient, No Pcp Per   Brief Narrative: Patient is a 72 year old male with history of hyperlipidemia, hypertension, tobacco use who presented with positive blood cultures.  Patient presented to ED 1 day prior to admission with left shoulder pain after having a fall.  Blood cultures were obtained at the time because he had low-grade temperatures.  Blood cultures subsequently showed gram-negative rods and he was recalled to the ED.  On presentation,he did not have any leukocytosis.  CT chest did not show any PE.  CT abdomen/pelvis showed mild urinary distention, CT head was negative for acute intracranial findings.  CT cervical spine showed multilevel cervical spondylosis.  Patient was admitted for sepsis/bacteremia and was started on IV antibiotics.  Subsequent MRI of the cervical found showed discitis/osteomyelitis at C4-5 with ventral epidural abscesses at the C5 level measuring 6 x 2 x 12 mm on the right and 4 x 2 x 10 mm on the left.  Patient urgently transferred to a [facility] for neurosurgery.  Underwent anterior cervical decompression C4-C5, C5-C6 arthrodesis with distal stable fixation and allograft anterior plate fixation of C4-C6 by Dr. Danielle Dess.  Blood cultures positive for E. coli.  ID also following, plan for 6 weeks course of ceftriaxone.  PICC line ordered today.  Complains of some shortness of breath, pending chest x-ray, ordered bronchodilators.  Possible discharge in next 1-2 days  if he remains comfortable and if neurosurgery clears  11/28/2022: Patient seen alongside patient's son, daughter and nurse.  Patient went into atrial fibrillation with RVR after PICC line placement today.  Heart rate has intermittently gone to the 160s.  Patient has history of hypertension and hyperlipidemia.  Cardiology team is being consulted to assist with patient's management.  Meanwhile, will start patient on Cardizem drip.   I have also contacted the neurosurgeons (Dr. Ninfa Meeker) office to advise if patient can be anticoagulated if necessary.  Cardiology team will see patient in consultation.  No chest pain or shortness of breath.  Patient reports headache.  Otherwise, no other constitutional symptoms reported.  Blood pressure ranges from 114-174/74 mmHg.  No documented fever.    Assessment & Plan:  Principal Problem:   Bacteremia due to Escherichia coli Active Problems:   Hyperlipidemia   Essential hypertension   Chronic lung disease   Tobacco use   Coronary artery calcification   Aortic atherosclerosis (HCC)   Normocytic anemia   Epidural abscess  Cervical discitis/osteomyelitis with epidural abscess/E. coli bacteremia: Scenario as above. Patient was admitted for sepsis/bacteremia and was started on IV antibiotics.  Subsequent MRI of the cervical found showed discitis/osteomyelitis at C4-5 with ventral epidural abscesses at the C5 level measuring 6 x 2 x 12 mm on the right and 4 x 2 x 10 mm on the left.  [facility].  Underwent anterior cervical decompression C4-C5, C5-C6 arthrodesis with distal stable fixation and allograft anterior plate fixation of C4-C6 by Dr. Danielle Dess.  Blood cultures positive for E. coli.  ID also following.  Elevated CRP.  Has mild leukocytosis.On ceftraxone.  No WBCs or organisms seen as per wound culture report. PT/OT consulted .  He ambulated well without any assistance this morning.  Will follow-up with neurosurgery for clearance before discharge. ID recommended 6 weeks of ceftriaxone, plan for PICC line placement. 11/28/2022: PICC line has been placed.  6 weeks of antibiotics is planned.  Atrial fibrillation with  rapid ventricular response: -Cardizem drip. -Cardiology consult. -Neurosurgery to advise safety of anticoagulation if indicated. -Check TSH and free T4. -D-dimer will likely be elevated (recent surgery etc.).  However,  will still check D-dimer -Will check Doppler ultrasound of the lower extremities if D-dimer is elevated. -Low threshold to also consider CTA chest. -Hold amlodipine for now.  Severe neck/bilateral shoulder pain: Orthopedics was consulted.  No evidence of septic arthritis but his symptoms are possibly due to severe rotator cuff pathology and glenohumeral arthritis.  No need of surgical intervention as per orthopedics.  Continue pain management and support care and orthopedics follow-up as an outpatient.   Shortness of breath, cough: He was saturating fine on room air.  Will check chest x-ray.  Continue current medications, bronchodilators added 12/17/2022: Seems stable.  Normocytic anemia: Currently hemoglobin stable. 11/28/2022: Last hemoglobin was 10.8 g/dL.  Hypertension/sinus tachycardia: Much improved now.  Continue to monitor.On losartan  at home, restarted.  Added 10 mg  amlodipine  History of hyperlipidemia: On Lipitor  Smoker: on nicotene patch  Constipation: Continue bowel regimen.  Had a small bowel movement yesterday  Urinary retention: New problem.  Started on Flomax.  Foley had to be inserted because he needed repeated in/ out.  Most likely he will need Foley on discharge and can follow-up with urology as an outpatient.  Can also give voiding trial before discharge      DVT prophylaxis:SCD's Start: 11/26/22 0514     Code Status: Full Code  Family Communication: Daughter at bedside on 7/31  Patient status:Inpatient  Patient is from :home  Anticipated discharge to: Home  Estimated DC date: After clearance from  neurosurgery, likely in next 1 to 2 days if he feels better   Consultants: Neurosurgery, ID, cardiology  Procedures: As above  Antimicrobials:  Anti-infectives (From admission, onward)    Start     Dose/Rate Route Frequency Ordered Stop   11/28/22 0000  cefTRIAXone (ROCEPHIN) IVPB        2 g Intravenous Every 24 hours 11/28/22 1029 01/08/23 2359    11/26/22 0600  ceFAZolin (ANCEF) IVPB 2g/100 mL premix        2 g 200 mL/hr over 30 Minutes Intravenous Every 8 hours 11/26/22 0513 11/26/22 1448   11/25/22 1700  vancomycin (VANCOCIN) IVPB 1000 mg/200 mL premix  Status:  Discontinued        1,000 mg 200 mL/hr over 60 Minutes Intravenous Every 24 hours 11/24/22 1509 11/25/22 1024   11/24/22 1600  vancomycin (VANCOREADY) IVPB 1250 mg/250 mL        1,250 mg 166.7 mL/hr over 90 Minutes Intravenous  Once 11/24/22 1508 11/24/22 1837   11/23/22 1030  cefTRIAXone (ROCEPHIN) 2 g in sodium chloride 0.9 % 100 mL IVPB        2 g 200 mL/hr over 30 Minutes Intravenous Every 24 hours 11/23/22 1015     11/23/22 1015  cefTRIAXone (ROCEPHIN) 2 g in sodium chloride 0.9 % 100 mL IVPB  Status:  Discontinued        2 g 200 mL/hr over 30 Minutes Intravenous  Once 11/23/22 1002 11/23/22 1015       Subjective: Patient is in paroxysmal atrial fibrillation with RVR Reports headache. No chest pain No shortness of breath No fever or chills   Objective: Vitals:   11/28/22 0718 11/28/22 1042 11/28/22 1118 11/28/22 1135  BP: (!) 174/74 (!) 151/135  114/71  Pulse: 80 (!) 138    Resp:  14  18  Temp: 98.1 F (36.7 C) 98.2 F (36.8 C)    TempSrc: Oral     SpO2: 99% 99% 98%   Weight:      Height:        Intake/Output Summary (Last 24 hours) at 11/28/2022 1151 Last data filed at 11/28/2022 1149 Gross per 24 hour  Intake 110 ml  Output 950 ml  Net -840 ml   Filed Weights   11/23/22 0910  Weight: 55 kg    Examination:  General exam: Patient is awake and alert.  Patient is not in any distress.   HEENT: PERRL, surgical wound on the neck Respiratory system: Clear to auscultation. Cardiovascular system: S1 & S2, tachycardic. Gastrointestinal system: Abdomen is nondistended, soft and nontender. Central nervous system: Alert and oriented.  Patient moves all extremities. Extremities: No leg edema.  Data Reviewed: I have personally reviewed following  labs and imaging studies  CBC: Recent Labs  Lab 11/22/22 1312 11/23/22 1400 11/24/22 0637 11/25/22 0530 11/26/22 0549 11/27/22 0220  WBC 9.6 8.7 9.4 9.3 13.1* 11.1*  NEUTROABS 7.9* 6.7  --  6.4 10.4*  --   HGB 13.8 12.8* 11.4* 11.0* 11.5* 10.8*  HCT 41.8 38.6* 34.8* 32.2* 35.2* 32.4*  MCV 86.7 87.1 87.7 84.1 86.3 84.4  PLT 122* 122* 122* 145* 198 235   Basic Metabolic Panel: Recent Labs  Lab 11/23/22 1400 11/24/22 0637 11/25/22 0530 11/26/22 0549 11/27/22 0220  NA 134* 137 136 136 133*  K 4.0 3.8 3.1* 4.0 3.3*  CL 104 109 104 102 100  CO2 16* 14* 24 21* 23  GLUCOSE 71 69* 145* 116* 106*  BUN 41* 31* 22 15 12   CREATININE 1.03 0.95 0.76 0.83 0.73  CALCIUM 8.0* 7.8* 7.5* 7.8* 7.7*  MG 2.3  --  2.2 2.0  --   PHOS 4.1  --   --   --   --      Recent Results (from the past 240 hour(s))  Culture, blood (routine x 2)     Status: Abnormal   Collection Time: 11/22/22  1:08 PM   Specimen: BLOOD RIGHT FOREARM  Result Value Ref Range Status   Specimen Description   Final    BLOOD RIGHT FOREARM Performed at [facility], [address], [place], [place] [address]    Special Requests   Final    BOTTLES DRAWN AEROBIC AND ANAEROBIC Blood Culture adequate volume Performed at [facility], [address], [place], [place] [address]    Culture  Setup Time   Final    GRAM NEGATIVE RODS AEROBIC BOTTLE ONLY CRITICAL RESULT CALLED TO, READ BACK BY AND VERIFIED WITH: RN Rush Farmer ON [id number] @0723  BY SM Performed at [facility], [address], [place], [place] [address]    Culture ESCHERICHIA COLI (A)  Final   Report Status 11/25/2022 FINAL  Final   Organism ID, Bacteria ESCHERICHIA COLI  Final      Susceptibility   Escherichia coli - MIC*    AMPICILLIN >=32 RESISTANT Resistant     CEFEPIME <=0.12 SENSITIVE Sensitive     CEFTAZIDIME <=1 SENSITIVE Sensitive     CEFTRIAXONE <=0.25 SENSITIVE Sensitive     CIPROFLOXACIN <=0.25 SENSITIVE Sensitive      GENTAMICIN >=16 RESISTANT Resistant     IMIPENEM <=0.25 SENSITIVE Sensitive     TRIMETH/SULFA >=320 RESISTANT Resistant     AMPICILLIN/SULBACTAM 8 SENSITIVE Sensitive     PIP/TAZO <=4 SENSITIVE Sensitive     *  ESCHERICHIA COLI  Blood Culture ID Panel (Reflexed)     Status: Abnormal   Collection Time: 11/22/22  1:08 PM  Result Value Ref Range Status   Enterococcus faecalis NOT DETECTED NOT DETECTED Final   Enterococcus Faecium NOT DETECTED NOT DETECTED Final   Listeria monocytogenes NOT DETECTED NOT DETECTED Final   Staphylococcus species NOT DETECTED NOT DETECTED Final   Staphylococcus aureus (BCID) NOT DETECTED NOT DETECTED Final   Staphylococcus epidermidis NOT DETECTED NOT DETECTED Final   Staphylococcus lugdunensis NOT DETECTED NOT DETECTED Final   Streptococcus species NOT DETECTED NOT DETECTED Final   Streptococcus agalactiae NOT DETECTED NOT DETECTED Final   Streptococcus pneumoniae NOT DETECTED NOT DETECTED Final   Streptococcus pyogenes NOT DETECTED NOT DETECTED Final   A.calcoaceticus-baumannii NOT DETECTED NOT DETECTED Final   Bacteroides fragilis NOT DETECTED NOT DETECTED Final   Enterobacterales DETECTED (A) NOT DETECTED Final    Comment: Enterobacterales represent a large order of gram negative bacteria, not a single organism. CRITICAL RESULT CALLED TO, READ BACK BY AND VERIFIED WITH: RN Rush Farmer ON [id number] @0723  BY SM    Enterobacter cloacae complex NOT DETECTED NOT DETECTED Final   Escherichia coli DETECTED (A) NOT DETECTED Final    Comment: CRITICAL RESULT CALLED TO, READ BACK BY AND VERIFIED WITH: RN Rush Farmer ON [id number] @0723  BY SM    Klebsiella aerogenes NOT DETECTED NOT DETECTED Final   Klebsiella oxytoca NOT DETECTED NOT DETECTED Final   Klebsiella pneumoniae NOT DETECTED NOT DETECTED Final   Proteus species NOT DETECTED NOT DETECTED Final   Salmonella species NOT DETECTED NOT DETECTED Final   Serratia marcescens NOT DETECTED NOT DETECTED Final    Haemophilus influenzae NOT DETECTED NOT DETECTED Final   Neisseria meningitidis NOT DETECTED NOT DETECTED Final   Pseudomonas aeruginosa NOT DETECTED NOT DETECTED Final   Stenotrophomonas maltophilia NOT DETECTED NOT DETECTED Final   Candida albicans NOT DETECTED NOT DETECTED Final   Candida auris NOT DETECTED NOT DETECTED Final   Candida glabrata NOT DETECTED NOT DETECTED Final   Candida krusei NOT DETECTED NOT DETECTED Final   Candida parapsilosis NOT DETECTED NOT DETECTED Final   Candida tropicalis NOT DETECTED NOT DETECTED Final   Cryptococcus neoformans/gattii NOT DETECTED NOT DETECTED Final   CTX-M ESBL NOT DETECTED NOT DETECTED Final   Carbapenem resistance IMP NOT DETECTED NOT DETECTED Final   Carbapenem resistance KPC NOT DETECTED NOT DETECTED Final   Carbapenem resistance NDM NOT DETECTED NOT DETECTED Final   Carbapenem resist OXA 48 LIKE NOT DETECTED NOT DETECTED Final   Carbapenem resistance VIM NOT DETECTED NOT DETECTED Final    Comment: Performed at [facility], [address], [place], [place] [address]  Urine Culture     Status: None   Collection Time: 11/22/22  1:26 PM   Specimen: Urine, Random  Result Value Ref Range Status   Specimen Description   Final    URINE, RANDOM Performed at [facility], [address] [place], [place] [address]    Special Requests   Final    NONE Reflexed from [id number] Performed at [facility], [address], [place], [place] [address]    Culture   Final    NO GROWTH Performed at [facility], [address], [place], [place] [address]    Report Status 11/24/2022 FINAL  Final  SARS Coronavirus 2 by RT PCR (hospital order, performed in [facility] hospital lab) *cepheid single result test* Anterior Nasal Swab  Status: None   Collection Time: 11/22/22  1:40 PM   Specimen: Anterior Nasal Swab  Result Value Ref Range Status   SARS Coronavirus 2 by RT PCR NEGATIVE NEGATIVE Final     Comment: (NOTE) SARS-CoV-2 target nucleic acids are NOT DETECTED.  The SARS-CoV-2 RNA is generally detectable in upper and lower respiratory specimens during the acute phase of infection. The lowest concentration of SARS-CoV-2 viral copies this assay can detect is 250 copies / mL. A negative result does not preclude SARS-CoV-2 infection and should not be used as the sole basis for treatment or other patient management decisions.  A negative result may occur with improper specimen collection / handling, submission of specimen other than nasopharyngeal swab, presence of viral mutation(s) within the areas targeted by this assay, and inadequate number of viral copies (<250 copies / mL). A negative result must be combined with clinical observations, patient history, and epidemiological information.  Fact Sheet for Patients:   RoadLapTop.co.za  Fact Sheet for Healthcare Providers: http://kim-miller.com/  This test is not yet approved or  cleared by the [place] FDA and has been authorized for detection and/or diagnosis of SARS-CoV-2 by FDA under an Emergency Use Authorization (EUA).  This EUA will remain in effect (meaning this test can be used) for the duration of the COVID-19 declaration under Section 564(b)(1) of the Act, 21 U.S.C. section 360bbb-3(b)(1), unless the authorization is terminated or revoked sooner.  Performed at [facility], [address] [place], [place] [address]   Culture, blood (Routine X 2) w Reflex to ID Panel     Status: None (Preliminary result)   Collection Time: 11/24/22  3:27 PM   Specimen: BLOOD LEFT HAND  Result Value Ref Range Status   Specimen Description   Final    BLOOD LEFT HAND Performed at [facility], [address], [place], [place] [address]    Special Requests   Final    AEROBIC BOTTLE ONLY Blood Culture adequate volume Performed at [facility], [address] [place], [place] [address]    Culture   Final    NO GROWTH 4 DAYS Performed at [facility], [address], [place], [place] [address]    Report Status PENDING  Incomplete  Culture, blood (Routine X 2) w Reflex to ID Panel     Status: None (Preliminary result)   Collection Time: 11/24/22  3:35 PM   Specimen: BLOOD LEFT ARM  Result Value Ref Range Status   Specimen Description   Final    BLOOD LEFT ARM Performed at [facility], [address], [place], [place] [address]    Special Requests   Final    AEROBIC BOTTLE ONLY Blood Culture adequate volume Performed at [facility], [address], [place], [place] [address]    Culture   Final    NO GROWTH 4 DAYS Performed at [facility], [address] [place], [place] [address]    Report Status PENDING  Incomplete  Surgical pcr screen     Status: Abnormal   Collection Time: 11/25/22  8:22 PM   Specimen: Nasal Mucosa; Nasal Swab  Result Value Ref Range Status   MRSA, PCR NEGATIVE NEGATIVE Final   Staphylococcus aureus POSITIVE (A) NEGATIVE Final    Comment: (NOTE) The Xpert SA Assay (FDA approved for NASAL specimens in patients 79 years of age and older), is one component of a comprehensive surveillance program. It  is not intended to diagnose infection nor to guide or monitor treatment. Performed at [facility], [address] [place], [place] [address]   Aerobic/Anaerobic Culture w Gram Stain (surgical/deep wound)     Status: None (Preliminary result)   Collection Time: 11/26/22  1:11 AM   Specimen: Soft Tissue, Other  Result Value Ref Range Status   Specimen Description OTHER  Final   Special Requests SFT TISS  Final   Gram Stain   Final    NO WBC SEEN NO ORGANISMS SEEN Performed at [facility], [address], [place], [place] [address]    Culture FEW ESCHERICHIA COLI  Final   Report Status PENDING  Incomplete    Organism ID, Bacteria ESCHERICHIA COLI  Final      Susceptibility   Escherichia coli - MIC*    AMPICILLIN >=32 RESISTANT Resistant     CEFEPIME <=0.12 SENSITIVE Sensitive     CEFTAZIDIME <=1 SENSITIVE Sensitive     CEFTRIAXONE <=0.25 SENSITIVE Sensitive     CIPROFLOXACIN <=0.25 SENSITIVE Sensitive     GENTAMICIN >=16 RESISTANT Resistant     IMIPENEM <=0.25 SENSITIVE Sensitive     TRIMETH/SULFA >=320 RESISTANT Resistant     AMPICILLIN/SULBACTAM 4 SENSITIVE Sensitive     PIP/TAZO <=4 SENSITIVE Sensitive     * FEW ESCHERICHIA COLI  Aerobic/Anaerobic Culture w Gram Stain (surgical/deep wound)     Status: None (Preliminary result)   Collection Time: 11/26/22  1:21 AM   Specimen: Abscess  Result Value Ref Range Status   Specimen Description ABSCESS  Final   Special Requests NONE  Final   Gram Stain   Final    RARE WBC PRESENT, PREDOMINANTLY PMN NO ORGANISMS SEEN Performed at [facility], [address] [place], [place] [address]    Culture FEW ESCHERICHIA COLI  Final   Report Status PENDING  Incomplete   Organism ID, Bacteria ESCHERICHIA COLI  Final      Susceptibility   Escherichia coli - MIC*    AMPICILLIN >=32 RESISTANT Resistant     CEFEPIME <=0.12 SENSITIVE Sensitive     CEFTAZIDIME <=1 SENSITIVE Sensitive     CEFTRIAXONE <=0.25 SENSITIVE Sensitive     CIPROFLOXACIN <=0.25 SENSITIVE Sensitive     GENTAMICIN >=16 RESISTANT Resistant     IMIPENEM <=0.25 SENSITIVE Sensitive     TRIMETH/SULFA >=320 RESISTANT Resistant     AMPICILLIN/SULBACTAM 4 SENSITIVE Sensitive     PIP/TAZO <=4 SENSITIVE Sensitive     * FEW ESCHERICHIA COLI  Culture, blood (Routine X 2) w Reflex to ID Panel     Status: None (Preliminary result)   Collection Time: 11/26/22  7:46 AM   Specimen: BLOOD  Result Value Ref Range Status   Specimen Description BLOOD BLOOD RIGHT ARM  Final   Special Requests   Final    BOTTLES DRAWN AEROBIC AND ANAEROBIC Blood Culture adequate volume   Culture   Final     NO GROWTH 2 DAYS Performed at [facility], [address] [place], [place] [address]    Report Status PENDING  Incomplete  Culture, blood (Routine X 2) w Reflex to ID Panel     Status: None (Preliminary result)   Collection Time: 11/26/22  7:46 AM   Specimen: BLOOD  Result Value Ref Range Status   Specimen Description BLOOD BLOOD LEFT HAND  Final   Special Requests   Final    AEROBIC BOTTLE ONLY Blood Culture results may not  be optimal due to an inadequate volume of blood received in culture bottles   Culture   Final    NO GROWTH 2 DAYS Performed at [facility], [address], [place], [place] [address]    Report Status PENDING  Incomplete     Radiology Studies: DG CHEST PORT 1 VIEW  Result Date: 11/27/2022 CLINICAL DATA:  Shortness of breath. EXAM: PORTABLE CHEST 1 VIEW COMPARISON:  November 22, 2022.  November 23, 2022. FINDINGS: Stable cardiomegaly. Mild bibasilar subsegmental atelectasis is noted. The visualized skeletal structures are unremarkable. IMPRESSION: Mild bibasilar subsegmental atelectasis. Electronically Signed   By: Lupita Raider M.D.   On: 11/27/2022 14:16   [place] EKG SITE RITE  Result Date: 11/27/2022 If Site Rite image not attached, placement could not be confirmed due to current cardiac rhythm.   Scheduled Meds:  amLODipine  10 mg Oral Daily   Chlorhexidine Gluconate Cloth  6 each Topical Daily   docusate sodium  100 mg Oral BID   guaiFENesin  600 mg Oral BID   lidocaine  1 patch Transdermal Q24H   losartan  50 mg Oral Daily   mupirocin ointment  1 Application Nasal BID   polyethylene glycol  17 g Oral Daily   senna  1 tablet Oral BID   sodium chloride flush  10-40 mL Intracatheter Q12H   sodium chloride flush  3 mL Intravenous Q12H   sodium chloride HYPERTONIC  4 mL Nebulization Daily   tamsulosin  0.4 mg Oral Daily   Continuous Infusions:  sodium chloride 250 mL (11/26/22 0900)   cefTRIAXone (ROCEPHIN)  IV 2 g (11/27/22 1037)   diltiazem  (CARDIZEM) infusion 5 mg/hr (11/28/22 1146)     LOS: 5 days   Time spent: 55 minutes.  Barnetta Chapel, MD [facility] P8/04/2022, 11:51 AM

## 2022-11-29 DIAGNOSIS — B9620 Unspecified Escherichia coli [E. coli] as the cause of diseases classified elsewhere: Secondary | ICD-10-CM | POA: Diagnosis not present

## 2022-11-29 DIAGNOSIS — R7881 Bacteremia: Secondary | ICD-10-CM | POA: Diagnosis not present

## 2022-11-29 SURGERY — ECHOCARDIOGRAM, TRANSESOPHAGEAL
Anesthesia: Monitor Anesthesia Care

## 2022-11-29 MED ORDER — POLYETHYLENE GLYCOL 3350 17 G PO PACK: 17.0000 g | PACK | Freq: Every day | ORAL | 0 refills | Status: DC

## 2022-11-29 MED ORDER — GUAIFENESIN ER 600 MG PO TB12: 600.0000 mg | ORAL_TABLET | Freq: Two times a day (BID) | ORAL | 0 refills | Status: AC

## 2022-11-29 MED ORDER — TAMSULOSIN HCL 0.4 MG PO CAPS: 0.4000 mg | ORAL_CAPSULE | Freq: Every day | ORAL | 1 refills | Status: DC

## 2022-11-29 MED ORDER — DOCUSATE SODIUM 100 MG PO CAPS: 100.0000 mg | ORAL_CAPSULE | Freq: Two times a day (BID) | ORAL | 0 refills | Status: DC

## 2022-11-29 MED ORDER — NICOTINE 14 MG/24HR TD PT24: 14.0000 mg | MEDICATED_PATCH | Freq: Every day | TRANSDERMAL | 0 refills | Status: DC | PRN

## 2022-11-29 MED ORDER — LIDOCAINE VISCOUS HCL 2 % MT SOLN: 15.0000 mL | OROMUCOSAL | 0 refills | Status: DC | PRN

## 2022-11-29 MED ORDER — MUPIROCIN 2 % EX OINT: 1.0000 | TOPICAL_OINTMENT | Freq: Two times a day (BID) | CUTANEOUS | 0 refills | Status: DC

## 2022-11-29 MED ORDER — OXYCODONE HCL 5 MG PO TABS: 5.0000 mg | ORAL_TABLET | Freq: Four times a day (QID) | ORAL | 0 refills | Status: DC | PRN

## 2022-11-29 MED ORDER — LIDOCAINE 5 % EX PTCH: 1.0000 | MEDICATED_PATCH | CUTANEOUS | 0 refills | Status: DC

## 2022-11-29 MED ORDER — SENNA 8.6 MG PO TABS: 1.0000 | ORAL_TABLET | Freq: Two times a day (BID) | ORAL | 0 refills | Status: DC

## 2022-11-29 MED ORDER — MENTHOL 3 MG MT LOZG: 1.0000 | LOZENGE | OROMUCOSAL | 12 refills | Status: DC | PRN

## 2022-11-29 NOTE — Progress Notes (Signed)
Patient ID: Frederick Andrews, male   DOB: January 30, 1951, 72 y.o.   MRN: [id number] Patient noted to have developed atrial fibrillation.  I was asked whether patient can be anticoagulated safely and I agree that at this point he can be started on anticoagulation.  Also there is no neurosurgical contraindication to transesophageal echocardiogram if this needs to be performed.  Follow-up will be in 2 weeks time as an outpatient.

## 2022-11-29 NOTE — Discharge Summary (Signed)
Physician Discharge Summary  Patient ID: Frederick Andrews MRN: [id number] DOB/AGE: April 18, 1951 72 y.o.  Admit date: 11/23/2022 Discharge date: 11/29/2022  Admission Diagnoses:  Discharge Diagnoses:  Principal Problem:   Bacteremia due to Escherichia coli Active Problems:   Hyperlipidemia   Essential hypertension   Chronic lung disease   Tobacco use   Coronary artery calcification   Aortic atherosclerosis (HCC)   Normocytic anemia   Epidural abscess   Discharged Condition: stable  Hospital Course: Patient is a 72 year old male with history of hyperlipidemia, hypertension and tobacco use.  Patient was initially seen at the emergency room with left shoulder pain following a fall and low-grade fever.  Blood cultures obtained at the time eventually grew gram-negative rods.  Based on positive blood cultures, patient was called by the ED to return to the hospital for further management.  On presentation, patient did not have any leukocytosis.  CT chest did not show any PE.  CT abdomen/pelvis showed mild urinary distention, CT head was negative for acute intracranial findings.  CT cervical spine showed multilevel cervical spondylosis.  Patient was admitted for sepsis/bacteremia and was started on IV antibiotics.  Subsequent MRI of the cervical found showed discitis/osteomyelitis at C4-5 with ventral epidural abscesses at the C5 level measuring 6 x 2 x 12 mm on the right and 4 x 2 x 10 mm on the left.  Patient urgently transferred to a [facility] for neurosurgery.  Underwent anterior cervical decompression C4-C5, C5-C6 arthrodesis with distal stable fixation and allograft anterior plate fixation of C4-C6 by Dr. Danielle Dess.  Blood cultures positive for E. coli.  Infectious disease team has advised 6-week course of ceftriaxone.  PICC line was placed for IV antibiotics.  Patient be discharged back onto the care of the primary care provider, neurosurgery, orthopedic surgery and infectious disease team.   Hospital course was complicated by new onset atrial fibrillation with RVR.  Cardiology team was consulted to assist with management.  Due to the fact that the atrial fibrillation was short-lived, cardiology team decided to place patient on cardiac monitoring on discharge.  Patient will also follow-up with cardiology team on discharge.  Cervical discitis/osteomyelitis with epidural abscess/E. coli bacteremia: Scenario as above. Patient was admitted for sepsis/bacteremia and was started on IV antibiotics.  Subsequent MRI of the cervical found showed discitis/osteomyelitis at C4-5 with ventral epidural abscesses at the C5 level measuring 6 x 2 x 12 mm on the right and 4 x 2 x 10 mm on the left.  [facility].  Underwent anterior cervical decompression C4-C5, C5-C6 arthrodesis with distal stable fixation and allograft anterior plate fixation of C4-C6 by Dr. Danielle Dess.  Blood cultures positive for E. coli.  ID also following.  Elevated CRP.  Has mild leukocytosis.On ceftraxone.  No WBCs or organisms seen as per wound culture report. PT/OT consulted .  He ambulated well without any assistance this morning.  Will follow-up with neurosurgery for clearance before discharge. ID recommended 6 weeks of ceftriaxone, plan for PICC line placement. 11/28/2022: PICC line has been placed.  6 weeks of antibiotics is planned.   Atrial fibrillation with rapid ventricular response: -Cardizem drip. -Cardiology consult. -Checked TSH and free T4. -Patient was seen by cardiology team.  A-fib RVR was short-lived.  Cardiology team is advised heart monitoring.  Patient will follow-up with cardiology team on discharge.   Severe neck/bilateral shoulder pain: Orthopedics was consulted.  No evidence of septic arthritis but his symptoms are possibly  due to severe rotator cuff pathology and glenohumeral arthritis.  No need of surgical intervention as per orthopedics.  Continue pain  management and support care and orthopedics follow-up as an outpatient.    Shortness of breath, cough: He was saturating fine on room air.  Will check chest x-ray.  Continue current medications, bronchodilators added 12/17/2022: Seems stable.   Normocytic anemia: Currently hemoglobin stable. Last hemoglobin was 10.8 g/dL.   Hypertension/sinus tachycardia: Much improved now.  Continue to monitor.On losartan  at home, restarted.  Added 10 mg  amlodipine   History of hyperlipidemia: On Lipitor   Smoker: on nicotene patch   Constipation: Continue bowel regimen.  Had a small bowel movement yesterday   Urinary retention: New problem.  Started on Flomax.  Foley had to be inserted because he needed repeated in/ out.  Most likely he will need Foley on discharge and can follow-up with urology as an outpatient.  Can also give voiding trial before discharge      Consults: cardiology, ID, orthopedic surgery, and neurosurgery  Significant Diagnostic Studies:  Surgical wound grew: -E. Coli  Echo revealed: 1. Left ventricular ejection fraction, by estimation, is 55 to 60%. The  left ventricle has normal function. The left ventricle has no regional  wall motion abnormalities. Left ventricular diastolic parameters were  normal.   2. Right ventricular systolic function is normal. The right ventricular  size is normal. There is mildly elevated pulmonary artery systolic  pressure. The estimated right ventricular systolic pressure is 38.7 mmHg.   3. Left atrial size was mildly dilated.   4. The mitral valve is normal in structure. Mild mitral valve  regurgitation. No evidence of mitral stenosis.   5. The aortic valve is tricuspid. There is mild thickening of the aortic  valve. Aortic valve regurgitation is moderate. No aortic stenosis is  present.   6. 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   7. The inferior vena cava is normal in size with <50% respiratory  variability, suggesting right atrial  pressure of 8 mmHg.     MRI CERVICAL SPINE WITHOUT AND WITH CONTRAST revealed:   TECHNIQUE: Multiplanar and multiecho pulse sequences of the cervical spine, to include the craniocervical junction and cervicothoracic junction, were obtained without and with intravenous contrast.   CONTRAST:  5mL GADAVIST GADOBUTROL 1 MMOL/ML IV SOLN   COMPARISON:  CT of the cervical spine 11/22/2022   FINDINGS: Alignment: 3 mm anterolisthesis is present at C3-4. 2 mm anterolisthesis is present at C4-5. Straightening of the normal cervical lordosis is present.   Vertebrae: Signal and enhancement is present diffusely in the C4 and C5 vertebral bodies. Abnormal enhancement extends into the posterior elements both levels.   Cord: Cord is compressed the C4-5 level less than 6 mm. Increased T2 signal is present within the cord at C4 and C5.   Extensive dural enhancement extends C2 through T1-2 posteriorly. This is most prominent from C2-3 through C7. A right ventral epidural abscess measures 6 x 2 x 12 mm posterior to the C5 vertebral body. A left ventral epidural abscess measures 4 x 2 x 10 mm posterior to the C5 vertebral body. Cord size and signal is normal above the C3 vertebral level and below the C6 vertebral level.   Posterior Fossa, vertebral arteries, paraspinal tissues: Prevertebral edema and enhancement extends from the C1-2 level through C6. No abnormal fluid or enhancement extends into the mediastinum. The craniocervical junction is normal. Vertebral arteries are patent bilaterally.   Disc levels:  C2-3: Abnormal dural enhancement is present without focal stenosis.   C3-4: More prominent circumferential dural enhancement is present. Uncovertebral spurring contributes to mild right foraminal stenosis.   C4-5: Extensive epidural enhancement compresses the spinal cord to less than 6 mm. Enhancing soft tissue extends into the foramina bilaterally. Severe left foraminal stenosis is  secondary to uncovertebral and facet hypertrophy.   C5-6: Circumferential epidural enhancement is present. Moderate foraminal stenosis is present bilaterally.   C6-7: Abnormal dural and epidural enhancement is present without significant central canal stenosis. Abnormal enhancement extends into the foramina.   C7-T1: Mild posterior dural enhancement is present. No focal disc disease or stenosis is present.   IMPRESSION: 1. 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 2. Extensive dural enhancement extends from the C1-2 level through T1-2 posteriorly. This is most prominent from C2-3 through C7. 3. Increased T2 signal is present within the cervical spinal cord at C4 and C5 consistent with spinal cord edema. 4. Prevertebral edema and enhancement extends from the C1-2 level through C6. No extension into the mediastinum. 5. Severe left foraminal stenosis at C4-5. 6. Moderate foraminal stenosis bilaterally at C5-6. 7. Mild right foraminal stenosis at C3-4. 8. Mild posterior dural enhancement at C7-T1 without significant central canal stenosis. 9. Abnormal enhancement extends into the foramina bilaterally at C4-5, C5-6, and C6-7.   Critical Value/emergent results were called by telephone at the time of interpretation on 11/24/2022 at 2:36 pm to provider [facility] , who verbally acknowledged these results.     Electronically Signed   By: Marin Roberts M.D.   On: 11/24/2022 14:49    MRI OF THE LEFT SHOULDER WITHOUT AND WITH CONTRAST revealed:   TECHNIQUE: Multiplanar, multisequence MR imaging of the left shoulder was performed before and after the administration of intravenous contrast.   CONTRAST:  5mL GADAVIST GADOBUTROL 1 MMOL/ML IV SOLN   COMPARISON:  X-ray 11/22/2022   FINDINGS: Technical Note: Despite efforts by the technologist and patient, motion  artifact is present on today's exam and could not be eliminated. This reduces exam sensitivity and specificity.   Rotator cuff: Full-thickness, near complete tear of the subscapularis tendon with minimal retraction. Severe supraspinatus and infraspinatus tendinosis. High-grade partial-thickness tear along the anterior leading edge of the distal supraspinatus tendon. Intact teres minor.   Muscles: Preserved bulk and signal intensity of the rotator cuff musculature without edema, atrophy, or fatty infiltration.   Biceps long head: Long head biceps tendon is medially dislocated out of the bicipital groove.   Acromioclavicular Joint: Moderate degenerative changes of the AC joint. Trace subacromial-subdeltoid bursal fluid.   Glenohumeral Joint: Small glenohumeral joint effusion with relatively mild synovitis. No cartilage defect.   Labrum:  Suboptimally assessed.   Bones: No acute fracture. No dislocation. Degenerative subchondral marrow edema centered at the [facility] joint with small subchondral cystic changes. No erosion. No marrow replacing bone lesion.   Other: None.   IMPRESSION: LEFT SHOULDER:   1. Small glenohumeral joint effusion with relatively mild synovitis. This is favored to be reactive although septic arthritis is not entirely excluded in the setting of bacteremia. 2. Full-thickness, near-complete tear of the subscapularis tendon with minimal retraction. 3. Severe supraspinatus and infraspinatus tendinosis with high-grade partial-thickness tear along the anterior leading edge of the distal supraspinatus tendon. 4. Medial dislocation of the long head biceps tendon out of the bicipital  groove. 5. Moderate AC joint osteoarthritis.     Electronically Signed   By: Duanne Guess D.O.   On: 11/24/2022 14:01    MRI OF THE RIGHT SHOULDER WITHOUT AND WITH CONTRAST   TECHNIQUE: Multiplanar, multisequence MR imaging of the right shoulder was performed before and after  the administration of intravenous contrast.   CONTRAST:  5mL GADAVIST GADOBUTROL 1 MMOL/ML IV SOLN   COMPARISON:  X-ray 11/22/2022   FINDINGS: Technical Note: Despite efforts by the technologist and patient, motion artifact is present on today's exam and could not be eliminated. This reduces exam sensitivity and specificity.   Rotator cuff: Full thickness, near-complete tear of the distal supraspinatus tendon with retraction to the level of the humeral head apex. Partial-thickness tearing involving the cranial aspect of the distal subscapularis tendon. Intact infraspinatus and teres minor tendons.   Muscles: Preserved bulk and signal intensity of the rotator cuff musculature without edema, atrophy, or fatty infiltration.   Biceps long head: Intra-articular biceps tendinosis. Biceps tendon is appropriately positioned within the bicipital groove.   Acromioclavicular Joint: Minimal degenerative changes of the AC joint. Small volume subacromial-subdeltoid bursal fluid communicating with the glenohumeral joint.   Glenohumeral Joint: Small glenohumeral joint effusion with mild enhancing synovitis. Mild diffuse chondral thinning of the glenohumeral joint.   Labrum:  Appears degenerated, although suboptimally assessed.   Bones: No acute fracture. No dislocation. Reactive subcortical cystic changes within the posterior aspect of the greater tuberosity. No erosion. No marrow replacing bone lesion.   Other: None.   IMPRESSION: RIGHT SHOULDER:   1. Small glenohumeral joint effusion with relatively mild synovitis. This is favored to be reactive although septic arthritis is not entirely excluded in the setting of bacteremia. 2. Full-thickness, near-complete tear of the distal supraspinatus tendon with retraction to the level of the humeral head apex. 3. Partial-thickness tearing of the distal subscapularis tendon. 4. Intra-articular biceps tendinosis.     Electronically Signed    By: Duanne Guess D.O.   On: 11/24/2022 14:06   Discharge Exam: Blood pressure 135/60, pulse 86, temperature 97.9 F (36.6 C), temperature source Oral, resp. rate 18, height 5\' 3"  (1.6 m), weight 55 kg, SpO2 96%.   Disposition: Discharge disposition: 06-Home-Health Care Svc       Discharge Instructions     [facility] pharmacist to adjust dose for Vancomycin, Aminoglycosides and other anti-infective therapies as requested by physician.   Complete by: As directed    [facility] to provide Cath Flo 2mg    Complete by: As directed    Administer for PICC line occlusion and as ordered by physician for other access device issues.   Ambulatory Referral for Lung Cancer Scre   Complete by: As directed    Ambulatory referral to Urology   Complete by: As directed    Anaphylaxis Kit: Provided to treat any anaphylactic reaction to the medication being provided to the patient if First Dose or when requested by physician   Complete by: As directed    Epinephrine 1mg /ml vial / amp: Administer 0.3mg  (0.54ml) subcutaneously once for moderate to severe anaphylaxis, nurse to call physician and pharmacy when reaction occurs and call 911 if needed for immediate care   Diphenhydramine 50mg /ml IV vial: Administer 25-50mg  IV/IM PRN for first dose reaction, rash, itching, mild reaction, nurse to call physician and pharmacy when reaction occurs   Sodium Chloride 0.9% NS IV: Administer if needed for hypovolemic blood pressure drop or as ordered by physician after call  to physician with anaphylactic reaction   Change dressing on IV access line weekly and PRN   Complete by: As directed    Diet - low sodium heart healthy   Complete by: As directed    Discharge wound care:   Complete by: As directed    Continue current wound care regimen   Flush IV access with Sodium Chloride 0.9% and Heparin 10 units/ml or 100 units/ml   Complete by: As directed    Home infusion instructions -  [facility]   Complete by: As directed    Instructions: Flush IV access with Sodium Chloride 0.9% and Heparin 10units/ml or 100units/ml   Change dressing on IV access line: Weekly and PRN   Instructions Cath Flo 2mg : Administer for PICC Line occlusion and as ordered by physician for other access device   [facility] pharmacist to adjust dose for: Vancomycin, Aminoglycosides and other anti-infective therapies as requested by physician   Incentive spirometry RT   Complete by: As directed    Increase activity slowly   Complete by: As directed    Method of administration may be changed at the discretion of home infusion pharmacist based upon assessment of the patient and/or caregiver's ability to self-administer the medication ordered   Complete by: As directed       Allergies as of 11/29/2022   No Known Allergies      Medication List     STOP taking these medications    aspirin EC 81 MG tablet   HYDROcodone-acetaminophen 5-325 MG tablet Commonly known as: Norco       TAKE these medications    albuterol 108 (90 Base) MCG/ACT inhaler Commonly known as: VENTOLIN HFA Inhale 2 puffs into the lungs every 4 (four) hours as needed for wheezing.   atorvastatin 20 MG tablet Commonly known as: LIPITOR Take 20 mg by mouth daily.   cefTRIAXone IVPB Commonly known as: ROCEPHIN Inject 2 g into the vein daily. Indication: Cervical spine osteomyelitis/discitis  First Dose: Yes Last Day of Therapy: 01/07/2023 Labs - Once weekly:  CBC/D and BMP, Labs - Once weekly: ESR and CRP Method of administration: IV Push Method of administration may be changed at the discretion of home infusion pharmacist based upon assessment of the patient and/or caregiver's ability to self-administer the medication ordered.   cyclobenzaprine 7.5 MG tablet Commonly known as: FEXMID Take 7.5 mg by mouth 3 (three) times daily as needed for muscle spasms.   docusate sodium 100 MG  capsule Commonly known as: COLACE Take 1 capsule (100 mg total) by mouth 2 (two) times daily.   guaiFENesin 600 MG 12 hr tablet Commonly known as: MUCINEX Take 1 tablet (600 mg total) by mouth 2 (two) times daily for 5 days.   lidocaine 2 % solution Commonly known as: XYLOCAINE Use as directed 15 mLs in the mouth or throat every 4 (four) hours as needed for mouth pain (indigestion).   lidocaine 5 % Commonly known as: LIDODERM Place 1 patch onto the skin daily. Remove & Discard patch within 12 hours or as directed by MD Start taking on: November 30, 2022   losartan 50 MG tablet Commonly known as: COZAAR Take 50 mg by mouth daily.   menthol-cetylpyridinium 3 MG lozenge Commonly known as: CEPACOL Take 1 lozenge (3 mg total) by mouth as needed for sore throat.   mupirocin ointment 2 % Commonly known as: BACTROBAN Place 1 Application into the nose 2 (two) times daily.   nicotine 14  mg/24hr patch Commonly known as: NICODERM CQ - dosed in mg/24 hours Place 1 patch (14 mg total) onto the skin daily as needed (For nicotine replacement therapy).   oxyCODONE 5 MG immediate release tablet Commonly known as: Oxy IR/ROXICODONE Take 1 tablet (5 mg total) by mouth every 6 (six) hours as needed for moderate pain.   polyethylene glycol 17 g packet Commonly known as: MIRALAX / GLYCOLAX Take 17 g by mouth daily. Start taking on: November 30, 2022   senna 8.6 MG Tabs tablet Commonly known as: SENOKOT Take 1 tablet (8.6 mg total) by mouth 2 (two) times daily.   tamsulosin 0.4 MG Caps capsule Commonly known as: FLOMAX Take 1 capsule (0.4 mg total) by mouth daily. Start taking on: November 30, 2022               Discharge Care Instructions  (From admission, onward)           Start     Ordered   11/29/22 0000  Discharge wound care:       Comments: Continue current wound care regimen   11/29/22 1232   11/28/22 0000  Change dressing on IV access line weekly and PRN  (Home infusion  instructions - [facility] )        11/28/22 1029            Follow-up Information     [facility] Follow up.   Why: The home health nurse will contact you for the first home visit. Contact information: [phone]        Ronney Asters, NP Follow up.   Specialty: Cardiology Why: Thursday Jan 02, 2023 Appt at 10:55 AM (25 min) Contact information: [address] [place] [address] [phone]         [facility]. Call.   Why: Trial of Void Appoitment is scheduled for 12/17/2022 at 8:00AM   Call them to see if they are able to see you sooner. Contact information: [address] [place] [address] [phone]                Time spent: 35 minutes.  SignedBarnetta Chapel 11/29/2022, 3:43 PM

## 2022-11-29 NOTE — Progress Notes (Signed)
Nursing Discharge Note  Name: Frederick Andrews DOB: [birth year] MRN: [id number]   Admit Date: 11/23/2022 Discharge date: 11/29/2022  Frederick Andrews is to be discharged home per MD order. AVS completed. Reviewed with patient and son at bedside. Highlighted copy provided for patient to take home.  Patient/caregiver able to verbalize understanding of discharge instructions. PIV removed. Patient stable upon discharge.  Reviewed discharge instructions extensively with Frederick Andrews and his son at bedside.  Explained that he would go home with foley catheter, foley care demonstrated.  Explained that he would need to follow-up with urology, called to scheduled follow-up, but earliest available was 8/20. Provided contact information for other urology providers to see if they are able to get him in sooner to remove foley.  Notified patient and son that he would also need to follow up with Neurosurgery as an outpatient and keep his cardiology appointment.  Both patient and son verbalized understanding.   Discharge Instructions     [facility] pharmacist to adjust dose for Vancomycin, Aminoglycosides and other anti-infective therapies as requested by physician.   Complete by: As directed    [facility] to provide Cath Flo 2mg    Complete by: As directed    Administer for PICC line occlusion and as ordered by physician for other access device issues.   Ambulatory Referral for Lung Cancer Scre   Complete by: As directed    Ambulatory referral to Urology   Complete by: As directed    Anaphylaxis Kit: Provided to treat any anaphylactic reaction to the medication being provided to the patient if First Dose or when requested by physician   Complete by: As directed    Epinephrine 1mg /ml vial / amp: Administer 0.3mg  (0.66ml) subcutaneously once for moderate to severe anaphylaxis, nurse to call physician and pharmacy when reaction occurs and call 911 if needed for immediate care   Diphenhydramine 50mg /ml IV  vial: Administer 25-50mg  IV/IM PRN for first dose reaction, rash, itching, mild reaction, nurse to call physician and pharmacy when reaction occurs   Sodium Chloride 0.9% NS IV: Administer if needed for hypovolemic blood pressure drop or as ordered by physician after call to physician with anaphylactic reaction   Change dressing on IV access line weekly and PRN   Complete by: As directed    Diet - low sodium heart healthy   Complete by: As directed    Discharge wound care:   Complete by: As directed    Continue current wound care regimen   Flush IV access with Sodium Chloride 0.9% and Heparin 10 units/ml or 100 units/ml   Complete by: As directed    Home infusion instructions - [facility]   Complete by: As directed    Instructions: Flush IV access with Sodium Chloride 0.9% and Heparin 10units/ml or 100units/ml   Change dressing on IV access line: Weekly and PRN   Instructions Cath Flo 2mg : Administer for PICC Line occlusion and as ordered by physician for other access device   [facility] pharmacist to adjust dose for: Vancomycin, Aminoglycosides and other anti-infective therapies as requested by physician   Incentive spirometry RT   Complete by: As directed    Increase activity slowly   Complete by: As directed    Method of administration may be changed at the discretion of home infusion pharmacist based upon assessment of the patient and/or caregiver's ability to self-administer the medication ordered   Complete by: As directed        Allergies  as of 11/29/2022   No Known Allergies      Medication List     STOP taking these medications    aspirin EC 81 MG tablet   HYDROcodone-acetaminophen 5-325 MG tablet Commonly known as: Norco       TAKE these medications    albuterol 108 (90 Base) MCG/ACT inhaler Commonly known as: VENTOLIN HFA Inhale 2 puffs into the lungs every 4 (four) hours as needed for wheezing.   atorvastatin 20 MG  tablet Commonly known as: LIPITOR Take 20 mg by mouth daily.   cefTRIAXone IVPB Commonly known as: ROCEPHIN Inject 2 g into the vein daily. Indication: Cervical spine osteomyelitis/discitis  First Dose: Yes Last Day of Therapy: 01/07/2023 Labs - Once weekly:  CBC/D and BMP, Labs - Once weekly: ESR and CRP Method of administration: IV Push Method of administration may be changed at the discretion of home infusion pharmacist based upon assessment of the patient and/or caregiver's ability to self-administer the medication ordered.   cyclobenzaprine 7.5 MG tablet Commonly known as: FEXMID Take 7.5 mg by mouth 3 (three) times daily as needed for muscle spasms.   docusate sodium 100 MG capsule Commonly known as: COLACE Take 1 capsule (100 mg total) by mouth 2 (two) times daily.   guaiFENesin 600 MG 12 hr tablet Commonly known as: MUCINEX Take 1 tablet (600 mg total) by mouth 2 (two) times daily for 5 days.   lidocaine 2 % solution Commonly known as: XYLOCAINE Use as directed 15 mLs in the mouth or throat every 4 (four) hours as needed for mouth pain (indigestion).   lidocaine 5 % Commonly known as: LIDODERM Place 1 patch onto the skin daily. Remove & Discard patch within 12 hours or as directed by MD Start taking on: November 30, 2022   losartan 50 MG tablet Commonly known as: COZAAR Take 50 mg by mouth daily.   menthol-cetylpyridinium 3 MG lozenge Commonly known as: CEPACOL Take 1 lozenge (3 mg total) by mouth as needed for sore throat.   mupirocin ointment 2 % Commonly known as: BACTROBAN Place 1 Application into the nose 2 (two) times daily.   nicotine 14 mg/24hr patch Commonly known as: NICODERM CQ - dosed in mg/24 hours Place 1 patch (14 mg total) onto the skin daily as needed (For nicotine replacement therapy).   oxyCODONE 5 MG immediate release tablet Commonly known as: Oxy IR/ROXICODONE Take 1 tablet (5 mg total) by mouth every 6 (six) hours as needed for moderate  pain.   polyethylene glycol 17 g packet Commonly known as: MIRALAX / GLYCOLAX Take 17 g by mouth daily. Start taking on: November 30, 2022   senna 8.6 MG Tabs tablet Commonly known as: SENOKOT Take 1 tablet (8.6 mg total) by mouth 2 (two) times daily.   tamsulosin 0.4 MG Caps capsule Commonly known as: FLOMAX Take 1 capsule (0.4 mg total) by mouth daily. Start taking on: November 30, 2022               Discharge Care Instructions  (From admission, onward)           Start     Ordered   11/29/22 0000  Discharge wound care:       Comments: Continue current wound care regimen   11/29/22 1232   11/28/22 0000  Change dressing on IV access line weekly and PRN  (Home infusion instructions - [facility] )        11/28/22 1029  Discharge Instructions/ Education: Discharge instructions given to patient/family with verbalized understanding. Discharge education completed with patient/family including: follow up instructions, medication list, discharge activities, and limitations if indicated. Additional discharge instructions as indicated by discharging provider also reviewed. Patient and family able to verbalize understanding, all questions fully answered. Patient instructed to return to Emergency Department, call 911, or call MD for any changes in condition.  Patient escorted via wheelchair to lobby and discharged home via private automobile.

## 2022-11-29 NOTE — TOC Transition Note (Signed)
Transition of Care [facility]) - CM/SW Discharge Note   Patient Details  Name: Frederick Andrews MRN: [id number] Date of Birth: [birth year]  [facility]) CM/SW Contact:  Kermit Balo, RN Phone Number: 11/29/2022, 1:06 PM   Clinical Narrative:    Pt is discharging home with home IV abx through [facility]. They will also provide the [facility] RN. Pt has support at home. Daughter and son educated on the IV abx.  Caregiver form sent to [facility] for approval of aides at home. [name] will reach out to daughter. Family will transport home.   Final next level of care: Home w Home Health Services Barriers to Discharge: No Barriers Identified   Patient Goals and CMS Choice CMS Medicare.gov Compare Post Acute Care list provided to:: Patient Choice offered to / list presented to : Patient, Adult Children  Discharge Placement                         Discharge Plan and Services Additional resources added to the After Visit Summary for     Discharge Planning Services: CM Consult                      HH Arranged: RN [facility] Agency: [facility] Date HH Agency Contacted: 11/29/22   Representative spoke with at [facility] Agency: Pam  Social Determinants of Health (SDOH) Interventions SDOH Screenings   Food Insecurity: No Food Insecurity (11/25/2022)  Housing: Low Risk  (11/25/2022)  Transportation Needs: No Transportation Needs (11/25/2022)  Utilities: Not At Risk (11/25/2022)  Financial Resource Strain: Low Risk  (11/20/2022)   Received from [facility]  Physical Activity: Insufficiently Active (06/06/2022)   Received from [facility]  Social Connections: Socially Integrated (06/06/2022)   Received from [facility]  Stress: No Stress Concern Present (06/06/2022)   Received from [facility]  Tobacco Use: High Risk (11/26/2022)     Readmission Risk Interventions     No data to display

## 2022-11-29 NOTE — Progress Notes (Signed)
Patient seems to be mainintaing SR on telemetry review. Chart reviewed, patient not seen.   No new recommendations. No further inpatient cardiology workup required. See consult note.   Parke Poisson, MD

## 2022-12-01 DIAGNOSIS — I480 Paroxysmal atrial fibrillation: Secondary | ICD-10-CM | POA: Diagnosis not present

## 2022-12-05 ENCOUNTER — Encounter (HOSPITAL_BASED_OUTPATIENT_CLINIC_OR_DEPARTMENT_OTHER): Payer: Self-pay | Admitting: Emergency Medicine

## 2022-12-05 ENCOUNTER — Ambulatory Visit (INDEPENDENT_AMBULATORY_CARE_PROVIDER_SITE_OTHER): Payer: 59 | Admitting: Urology

## 2022-12-05 ENCOUNTER — Encounter: Payer: Self-pay | Admitting: Urology

## 2022-12-05 ENCOUNTER — Other Ambulatory Visit: Payer: Self-pay

## 2022-12-05 ENCOUNTER — Emergency Department (HOSPITAL_BASED_OUTPATIENT_CLINIC_OR_DEPARTMENT_OTHER)
Admission: EM | Admit: 2022-12-05 | Discharge: 2022-12-05 | Disposition: A | Discharge status: Home / Self Care | Payer: 59 | Attending: Emergency Medicine | Admitting: Emergency Medicine

## 2022-12-05 VITALS — BP 112/66 | HR 102

## 2022-12-05 DIAGNOSIS — R339 Retention of urine, unspecified: Secondary | ICD-10-CM | POA: Insufficient documentation

## 2022-12-05 DIAGNOSIS — R1030 Lower abdominal pain, unspecified: Secondary | ICD-10-CM | POA: Diagnosis not present

## 2022-12-05 LAB — URINALYSIS, ROUTINE W REFLEX MICROSCOPIC
Bilirubin Urine: NEGATIVE
Glucose, UA: NEGATIVE mg/dL
Hgb urine dipstick: NEGATIVE
Ketones, ur: NEGATIVE mg/dL
Leukocytes,Ua: NEGATIVE
Nitrite: NEGATIVE
Protein, ur: NEGATIVE mg/dL
Specific Gravity, Urine: <=1.005 (ref 1.005–1.030)
pH: 6 (ref 5.0–8.0)

## 2022-12-05 NOTE — ED Triage Notes (Signed)
Patient arrived via POV c/o urinary retention post urinary catheter removal earlier today. Patient was able to void in office, but not at home. Patient states bladder pain. Patient is AO x 4, VS WDL, slow gait.

## 2022-12-05 NOTE — Progress Notes (Signed)
Assessment: 1. Urinary retention     Plan: I personally reviewed the patient's chart including provider notes, and lab results. Continue tamsulosin Foley removed today He is currently receiving IV Rocephin Advised patient and his family to return to office/ED if unable to void later today Return to office in 1 week with bladder scan  Chief Complaint:  Chief Complaint  Patient presents with   Urinary Retention    History of Present Illness:  Frederick Andrews is a 72 y.o. male who is seen in consultation from Berton Mount, MD for evaluation of urinary retention. 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  He initially voided postop but then developed urinary retention.  Bladder scan showed a volume of 970 mL.  He underwent I&O catheterization with initial volume of 750 mL.  He was started on tamsulosin.  A Foley catheter was placed and he was discharged with the Foley catheter. His catheter has been draining well.  His urine is remain clear. No prior history of urinary retention.  He did notice some gradual worsening of his symptoms associated with his neck pain prior to his hospitalization.  CT abdomen and pelvis with contrast from 11/23/2022 showed normal kidneys without mass or obstruction, mild bladder distention, and normal-appearing prostate.  Past Medical History:  Past Medical History:  Diagnosis Date   Hyperlipemia    Hypertension     Past Surgical History:  Past Surgical History:  Procedure Laterality Date   ANTERIOR CERVICAL DECOMPRESSION FOR EPIDURAL ABSCESS N/A 11/25/2022   Procedure: ANTERIOR CERVICAL DECOMPRESSION FUSION FOR EPIDURAL ABSCESS CERVICAL FOUR-FIVE AND CERVICAL FIVE-SIX;  Surgeon: Barnett Abu, MD;  Location: [facility] OR;  Service: Neurosurgery;  Laterality: N/A;    Allergies:  No Known Allergies  Family History:  Family History  Problem Relation Age of Onset   Heart disease Mother     Hypertension Mother    Hyperlipidemia Mother    Hyperlipidemia Other     Social History:  Social History   Tobacco Use   Smoking status: Every Day    Current packs/day: 1.00    Average packs/day: 1 pack/day for 40.0 years (40.0 ttl pk-yrs)    Types: Cigarettes   Tobacco comments:    Working on decreasing the number of cigarettes per day.  Substance Use Topics   Alcohol use: No    Alcohol/week: 0.0 standard drinks of alcohol   Drug use: No    Review of symptoms:  Constitutional:  Negative for unexplained weight loss, night sweats, fever, chills ENT:  Negative for nose bleeds, sinus pain, painful swallowing CV:  Negative for chest pain, shortness of breath, exercise intolerance, palpitations, loss of consciousness Resp:  Negative for cough, wheezing, shortness of breath GI:  Negative for nausea, vomiting, diarrhea, bloody stools GU:  Positives noted in HPI; otherwise negative for gross hematuria, dysuria, urinary incontinence Neuro:  Negative for seizures, poor balance, limb weakness, slurred speech Psych:  Negative for lack of energy, depression, anxiety Endocrine:  Negative for polydipsia, polyuria, symptoms of hypoglycemia (dizziness, hunger, sweating) Hematologic:  Negative for anemia, purpura, petechia, prolonged or excessive bleeding, use of anticoagulants  Allergic:  Negative for difficulty breathing or choking as a result of exposure to anything; no shellfish allergy; no allergic response (rash/itch) to materials, foods  Physical exam: BP 112/66   Pulse (!) 102  GENERAL APPEARANCE:  Well appearing, well developed, well nourished, NAD HEENT: Atraumatic, Normocephalic, oropharynx clear. NECK: Supple without lymphadenopathy or thyromegaly. LUNGS: Clear  to auscultation bilaterally. HEART: Regular Rate and Rhythm without murmurs, gallops, or rubs. ABDOMEN: Soft, non-tender, No Masses. EXTREMITIES: Moves all extremities well.  Without clubbing, cyanosis, or  edema. NEUROLOGIC:  Alert and oriented x 3, normal gait, CN II-XII grossly intact.  MENTAL STATUS:  Appropriate. BACK:  Non-tender to palpation.  No CVAT SKIN:  Warm, dry and intact.    Results: None  Procedure:  VOIDING TRIAL  A voiding trial was performed in the office today.   Volume of sterile water instilled: 360 mL Foley catheter removed intact. Volume voided by patient: 100 mL Instructed to return to office if has not voided by 4 PM

## 2022-12-05 NOTE — Progress Notes (Signed)
Fill and Pull Catheter Removal  Patient is present today for a catheter removal.  Patient was cleaned and prepped in a sterile fashion of sterile water/ saline was instilled into the bladder when the patient felt the urge to urinate, 10ml of water was then drained from the balloon.  A 16FR foley cath was removed from the bladder no complications were noted .  Patient as then given some time to void on their own.  Patient can void  on their own after some time.  Patient tolerated well.  Performed by: Arville Go CMA

## 2022-12-05 NOTE — Discharge Instructions (Addendum)
I would call Dr. Laverle Hobby office tomorrow to schedule an appointment and talk about maxed out with the Foley catheter.  Please leave in place until you follow-up with him.  He may return to the ER department for any worsening symptoms.

## 2022-12-05 NOTE — ED Provider Notes (Signed)
[facility] AT [facility] Provider Note   CSN: [id number] Arrival date & time: 12/05/22  2109     History Chief Complaint  Patient presents with   Urinary Retention    Frederick Andrews is a 72 y.o. male patient who presents to the emergency department today with suprapubic abdominal pain that started 5 hours ago.  Patient was seen and evaluated in the office today for urinary retention.  He had the Foley catheter removed with Dr. Pete Glatter today.  He was able to void in the office after some time and was able to void again around 5 PM.  Since then he has been unable to void and is now having suprapubic abdominal pain.  He denies any other symptoms.  HPI     Home Medications Prior to Admission medications   Medication Sig Start Date End Date Taking? Authorizing Provider  albuterol (VENTOLIN HFA) 108 (90 Base) MCG/ACT inhaler Inhale 2 puffs into the lungs every 4 (four) hours as needed for wheezing. 04/27/21   [provider]  atorvastatin (LIPITOR) 20 MG tablet Take 20 mg by mouth daily. 07/22/22   [provider]  cefTRIAXone (ROCEPHIN) IVPB Inject 2 g into the vein daily. Indication: Cervical spine osteomyelitis/discitis  First Dose: Yes Last Day of Therapy: 01/07/2023 Labs - Once weekly:  CBC/D and BMP, Labs - Once weekly: ESR and CRP Method of administration: IV Push Method of administration may be changed at the discretion of home infusion pharmacist based upon assessment of the patient and/or caregiver's ability to self-administer the medication ordered. 11/28/22 01/08/23  Vu, Gershon Mussel T, MD  docusate sodium (COLACE) 100 MG capsule Take 1 capsule (100 mg total) by mouth 2 (two) times daily. 11/29/22   Berton Mount I, MD  lidocaine (LIDODERM) 5 % Place 1 patch onto the skin daily. Remove & Discard patch within 12 hours or as directed by MD 11/30/22   Barnetta Chapel, MD  lidocaine (XYLOCAINE) 2 % solution Use as directed 15 mLs in the mouth  or throat every 4 (four) hours as needed for mouth pain (indigestion). 11/29/22   Barnetta Chapel, MD  losartan (COZAAR) 50 MG tablet Take 50 mg by mouth daily. 07/22/22   [provider]  menthol-cetylpyridinium (CEPACOL) 3 MG lozenge Take 1 lozenge (3 mg total) by mouth as needed for sore throat. 11/29/22   Barnetta Chapel, MD  mupirocin ointment (BACTROBAN) 2 % Place 1 Application into the nose 2 (two) times daily. 11/29/22   Barnetta Chapel, MD  nicotine (NICODERM CQ - DOSED IN MG/24 HOURS) 14 mg/24hr patch Place 1 patch (14 mg total) onto the skin daily as needed (For nicotine replacement therapy). 11/29/22   Berton Mount I, MD  oxyCODONE (OXY IR/ROXICODONE) 5 MG immediate release tablet Take 1 tablet (5 mg total) by mouth every 6 (six) hours as needed for moderate pain. 11/29/22   Berton Mount I, MD  polyethylene glycol (MIRALAX / GLYCOLAX) 17 g packet Take 17 g by mouth daily. 11/30/22   Barnetta Chapel, MD  senna (SENOKOT) 8.6 MG TABS tablet Take 1 tablet (8.6 mg total) by mouth 2 (two) times daily. 11/29/22   Berton Mount I, MD  tamsulosin (FLOMAX) 0.4 MG CAPS capsule Take 1 capsule (0.4 mg total) by mouth daily. 11/30/22   Barnetta Chapel, MD      Allergies    Patient has no known allergies.    Review of Systems   Review of Systems  All other systems reviewed and are negative.   Physical Exam Updated Vital Signs BP (!) 144/68 (BP Location: Left Arm)   Pulse 90   Temp 97.9 F (36.6 C) (Oral)   Resp 17   Ht 5\' 3"  (1.6 m)   Wt 57 kg   SpO2 99%   BMI 22.26 kg/m  Physical Exam Vitals and nursing note reviewed.  Constitutional:      Appearance: Normal appearance.  HENT:     Head: Normocephalic and atraumatic.  Eyes:     General:        Right eye: No discharge.        Left eye: No discharge.     Conjunctiva/sclera: Conjunctivae normal.  Pulmonary:     Effort: Pulmonary effort is normal.  Abdominal:     Tenderness: There is abdominal  tenderness in the suprapubic area.  Skin:    General: Skin is warm and dry.     Findings: No rash.  Neurological:     General: No focal deficit present.     Mental Status: He is alert.  Psychiatric:        Mood and Affect: Mood normal.        Behavior: Behavior normal.     ED Results / Procedures / Treatments   Labs (all labs ordered are listed, but only abnormal results are displayed) Labs Reviewed  URINE CULTURE  URINALYSIS, ROUTINE W REFLEX MICROSCOPIC    EKG None  Radiology No results found.  Procedures Procedures    Medications Ordered in ED Medications - No data to display  ED Course/ Medical Decision Making/ A&P Clinical Course as of 12/05/22 2234  Thu Dec 05, 2022  2150 Bladder scan revealed that there was greater than 800 mL in the bladder.  Will plan to proceed with the Foley catheter. [CF]  2227 Urinalysis, Routine w reflex microscopic -Urine, Clean Catch Negative.  [CF]  2230 Patient had over 1200 mL drained from the bladder. Will leave Foley catheter in place. He will call your [facility]'s office tomorrow for follow-up. [CF]    Clinical Course User Index [CF] Teressa Lower, PA-C   {   Click here for ABCD2, HEART and other calculators  Medical Decision Making Frederick Andrews is a 72 y.o. male patient who presents to the emergency department today for further evaluation of suprapubic abdominal pain.  Patient likely suffering from urinary retention again.  Will plan to get a bladder scan and likely have to place a Foley catheter to help drainage.    Amount and/or Complexity of Data Reviewed Labs: ordered. Decision-making details documented in ED Course.    Final Clinical Impression(s) / ED Diagnoses Final diagnoses:  Urinary retention    Rx / DC Orders ED Discharge Orders     None         Jolyn Lent 12/05/22 2235    Maia Plan, MD 12/06/22 1555

## 2022-12-05 NOTE — ED Notes (Signed)
EMT/Tech in room providing patient and family with foley care instructions.

## 2022-12-06 ENCOUNTER — Telehealth: Payer: Self-pay | Admitting: Urology

## 2022-12-06 NOTE — Telephone Encounter (Signed)
Voiding trial yesterday and they got home last night and he was unable to empty his bladder. Maureen Ralphs took him back to the ER last night. They were told to let dr stoneking know.   [phone]

## 2022-12-09 ENCOUNTER — Telehealth: Payer: Self-pay | Admitting: Urology

## 2022-12-09 ENCOUNTER — Telehealth: Payer: Self-pay

## 2022-12-09 NOTE — Telephone Encounter (Signed)
Returning a call she received again this morning. Maureen Ralphs [phone]

## 2022-12-09 NOTE — Telephone Encounter (Signed)
-----   Message from [facility] sent at 12/09/2022  8:17 AM EDT ----- Please notify patient to increase dose of tamsulosin to 2 capsules daily. He needs AM appt on Thursday for voiding trial.

## 2022-12-09 NOTE — Telephone Encounter (Signed)
LMOM for return call

## 2022-12-09 NOTE — Telephone Encounter (Signed)
Spoke w pts daughter, advised her Dr would like for pt to take 2 tamsulosin daily. She would like to know the potential side effects of taking 2 daily. Made repeat voiding trial appt. Advised her I would call her back tomorrow with an answer to her question. She expressed understanding.

## 2022-12-09 NOTE — Telephone Encounter (Signed)
Returning a call she received this morning. Maureen Ralphs [phone]

## 2022-12-10 NOTE — Telephone Encounter (Signed)
LMOM regarding 2 tamsulosin daily. Advised daughter to return call with any questions.

## 2022-12-12 ENCOUNTER — Encounter: Payer: Self-pay | Admitting: Urology

## 2022-12-12 ENCOUNTER — Ambulatory Visit (INDEPENDENT_AMBULATORY_CARE_PROVIDER_SITE_OTHER): Payer: 59 | Admitting: Urology

## 2022-12-12 ENCOUNTER — Emergency Department (HOSPITAL_BASED_OUTPATIENT_CLINIC_OR_DEPARTMENT_OTHER)
Admission: EM | Admit: 2022-12-12 | Discharge: 2022-12-12 | Disposition: A | Discharge status: Home / Self Care | Payer: 59 | Attending: Emergency Medicine | Admitting: Emergency Medicine

## 2022-12-12 ENCOUNTER — Encounter (HOSPITAL_BASED_OUTPATIENT_CLINIC_OR_DEPARTMENT_OTHER): Payer: Self-pay | Admitting: Emergency Medicine

## 2022-12-12 VITALS — BP 129/73 | HR 109

## 2022-12-12 DIAGNOSIS — R339 Retention of urine, unspecified: Secondary | ICD-10-CM | POA: Diagnosis not present

## 2022-12-12 DIAGNOSIS — Z79899 Other long term (current) drug therapy: Secondary | ICD-10-CM | POA: Diagnosis not present

## 2022-12-12 DIAGNOSIS — I10 Essential (primary) hypertension: Secondary | ICD-10-CM | POA: Diagnosis not present

## 2022-12-12 LAB — URINALYSIS, ROUTINE W REFLEX MICROSCOPIC
Bilirubin Urine: NEGATIVE
Glucose, UA: NEGATIVE mg/dL
Hgb urine dipstick: NEGATIVE
Ketones, ur: NEGATIVE mg/dL
Leukocytes,Ua: NEGATIVE
Nitrite: NEGATIVE
Protein, ur: NEGATIVE mg/dL
Specific Gravity, Urine: 1.015 (ref 1.005–1.030)
pH: 7 (ref 5.0–8.0)

## 2022-12-12 NOTE — ED Triage Notes (Signed)
Pt had catheter removed this morning; has voided x 2 since; last time was 1230, unable to void now

## 2022-12-12 NOTE — Progress Notes (Signed)
Assessment: 1. Urinary retention     Plan: Continue tamsulosin BID Foley removed today after successful voiding trial He is currently receiving IV Rocephin Return to office in 1 week with bladder scan Patient and daughter instructed to return later today if unable to void.  Chief Complaint:  Chief Complaint  Patient presents with   Urinary Retention    History of Present Illness:  Frederick Andrews is a 72 y.o. male who is seen for further evaluation of urinary retention. 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  He initially voided postop but then developed urinary retention.  Bladder scan showed a volume of 970 mL.  He underwent I&O catheterization with initial volume of 750 mL.  He was started on tamsulosin.  A Foley catheter was placed and he was discharged with the Foley catheter. No prior history of urinary retention.  He did notice some gradual worsening of his symptoms associated with his neck pain prior to his hospitalization.  CT abdomen and pelvis with contrast from 11/23/2022 showed normal kidneys without mass or obstruction, mild bladder distention, and normal-appearing prostate. His foley was removed on 12/05/22 following a voiding trial in the office.  He was able to void 1-2 times following foley removal.  He presented to ED later on 12/05/22 with urinary retention.  Foley placed with return of 1200 ml. His tamsulosin was increased to BID. He presents today for voiding trial.  The patient's daughter interpreted during the visit today.  Portions of the above documentation were copied from a prior visit for review purposes only.   Past Medical History:  Past Medical History:  Diagnosis Date   Hyperlipemia    Hypertension     Past Surgical History:  Past Surgical History:  Procedure Laterality Date   ANTERIOR CERVICAL DECOMPRESSION FOR EPIDURAL ABSCESS N/A 11/25/2022   Procedure: ANTERIOR CERVICAL DECOMPRESSION  FUSION FOR EPIDURAL ABSCESS CERVICAL FOUR-FIVE AND CERVICAL FIVE-SIX;  Surgeon: Barnett Abu, MD;  Location: [facility] OR;  Service: Neurosurgery;  Laterality: N/A;    Allergies:  No Known Allergies  Family History:  Family History  Problem Relation Age of Onset   Heart disease Mother    Hypertension Mother    Hyperlipidemia Mother    Hyperlipidemia Other     Social History:  Social History   Tobacco Use   Smoking status: Every Day    Current packs/day: 1.00    Average packs/day: 1 pack/day for 40.0 years (40.0 ttl pk-yrs)    Types: Cigarettes   Tobacco comments:    Working on decreasing the number of cigarettes per day.  Vaping Use   Vaping status: Never Used  Substance Use Topics   Alcohol use: No    Alcohol/week: 0.0 standard drinks of alcohol   Drug use: No    ROS: Constitutional:  Negative for fever, chills, weight loss CV: Negative for chest pain, previous MI, hypertension Respiratory:  Negative for shortness of breath, wheezing, sleep apnea, frequent cough GI:  Negative for nausea, vomiting, bloody stool, GERD   Physical exam: BP 129/73   Pulse (!) 109  GENERAL APPEARANCE:  Well appearing, well developed, well nourished, NAD HEENT:  Atraumatic, normocephalic, oropharynx clear NECK:  Supple without lymphadenopathy or thyromegaly ABDOMEN:  Soft, non-tender, no masses EXTREMITIES:  Moves all extremities well, without clubbing, cyanosis, or edema NEUROLOGIC:  Alert and oriented x 3, normal gait, CN II-XII grossly intact MENTAL STATUS:  appropriate BACK:  Non-tender to palpation, No CVAT SKIN:  Warm, dry, and intact GU: Prostate:  30 g, NT, no nodules Rectum:  normal sphincter tone, no masses  Results: None  Procedure:  VOIDING TRIAL  A voiding trial was performed in the office today.   Volume of sterile water instilled: 425 mL Foley catheter removed intact. Volume voided by patient: 300 mL Instructed to return to office if has not voided by 4 PM

## 2022-12-12 NOTE — ED Notes (Signed)
Bladder Scan: >550 mL.

## 2022-12-12 NOTE — ED Provider Notes (Signed)
[place] [facility] Provider Note   CSN: [id number] Arrival date & time: 12/12/22  1620     History  Chief Complaint  Patient presents with   Urinary Retention    Frederick Andrews is a 72 y.o. male with history of HTN and HLD who presents to the ER for urinary retention. Pt had foley catheter placed for urinary retention by urology, removed in clinic this AM. Was able to urinate twice since removal, but not since 1230.   The history is provided by the patient and a relative. The history is limited by a language barrier. A language interpreter was used (Daughter acting as interpreter).       Home Medications Prior to Admission medications   Medication Sig Start Date End Date Taking? Authorizing Provider  albuterol (VENTOLIN HFA) 108 (90 Base) MCG/ACT inhaler Inhale 2 puffs into the lungs every 4 (four) hours as needed for wheezing. 04/27/21   [provider]  atorvastatin (LIPITOR) 20 MG tablet Take 20 mg by mouth daily. 07/22/22   [provider]  cefTRIAXone (ROCEPHIN) IVPB Inject 2 g into the vein daily. Indication: Cervical spine osteomyelitis/discitis  First Dose: Yes Last Day of Therapy: 01/07/2023 Labs - Once weekly:  CBC/D and BMP, Labs - Once weekly: ESR and CRP Method of administration: IV Push Method of administration may be changed at the discretion of home infusion pharmacist based upon assessment of the patient and/or caregiver's ability to self-administer the medication ordered. 11/28/22 01/08/23  Vu, Gershon Mussel T, MD  docusate sodium (COLACE) 100 MG capsule Take 1 capsule (100 mg total) by mouth 2 (two) times daily. 11/29/22   Berton Mount I, MD  lidocaine (LIDODERM) 5 % Place 1 patch onto the skin daily. Remove & Discard patch within 12 hours or as directed by MD 11/30/22   Barnetta Chapel, MD  lidocaine (XYLOCAINE) 2 % solution Use as directed 15 mLs in the mouth or throat every 4 (four) hours as needed for mouth pain  (indigestion). 11/29/22   Barnetta Chapel, MD  losartan (COZAAR) 50 MG tablet Take 50 mg by mouth daily. 07/22/22   [provider]  menthol-cetylpyridinium (CEPACOL) 3 MG lozenge Take 1 lozenge (3 mg total) by mouth as needed for sore throat. 11/29/22   Barnetta Chapel, MD  mupirocin ointment (BACTROBAN) 2 % Place 1 Application into the nose 2 (two) times daily. 11/29/22   Barnetta Chapel, MD  nicotine (NICODERM CQ - DOSED IN MG/24 HOURS) 14 mg/24hr patch Place 1 patch (14 mg total) onto the skin daily as needed (For nicotine replacement therapy). 11/29/22   Berton Mount I, MD  oxyCODONE (OXY IR/ROXICODONE) 5 MG immediate release tablet Take 1 tablet (5 mg total) by mouth every 6 (six) hours as needed for moderate pain. 11/29/22   Berton Mount I, MD  polyethylene glycol (MIRALAX / GLYCOLAX) 17 g packet Take 17 g by mouth daily. 11/30/22   Barnetta Chapel, MD  senna (SENOKOT) 8.6 MG TABS tablet Take 1 tablet (8.6 mg total) by mouth 2 (two) times daily. 11/29/22   Berton Mount I, MD  tamsulosin (FLOMAX) 0.4 MG CAPS capsule Take 1 capsule (0.4 mg total) by mouth daily. 11/30/22   Barnetta Chapel, MD      Allergies    Patient has no known allergies.    Review of Systems   Review of Systems  Genitourinary:  Positive for difficulty urinating.  All other systems reviewed and are  negative.   Physical Exam Updated Vital Signs BP 115/67   Pulse 98   Temp (!) 97.3 F (36.3 C)   Resp 15   SpO2 97%  Physical Exam Vitals and nursing note reviewed.  Constitutional:      Appearance: Normal appearance.  HENT:     Head: Normocephalic and atraumatic.  Eyes:     Conjunctiva/sclera: Conjunctivae normal.  Pulmonary:     Effort: Pulmonary effort is normal. No respiratory distress.  Skin:    General: Skin is warm and dry.  Neurological:     Mental Status: He is alert.  Psychiatric:        Mood and Affect: Mood normal.        Behavior: Behavior normal.     ED  Results / Procedures / Treatments   Labs (all labs ordered are listed, but only abnormal results are displayed) Labs Reviewed  URINALYSIS, ROUTINE W REFLEX MICROSCOPIC    EKG None  Radiology No results found.  Procedures Procedures    Medications Ordered in ED Medications - No data to display  ED Course/ Medical Decision Making/ A&P Clinical Course as of 12/12/22 1731  Thu Dec 12, 2022  1653 Bladder scan with > 550 mL [LR]  1730 Foley catheter placed -- 16 french [LR]    Clinical Course User Index [LR] , Lora Paula, PA-C                                 Medical Decision Making Amount and/or Complexity of Data Reviewed Labs: ordered.   This patient is a 72 y.o. male  who presents to the ED for concern of urinary retention.   Differential diagnoses prior to evaluation: The emergent differential diagnosis includes, but is not limited to,  UA, bladder obstruction, need for indwelling foley. This is not an exhaustive differential.   Past Medical History / Co-morbidities / Social History: HTN, HLD  Additional history: Chart reviewed. Pertinent results include: Reviewed urology visit note from this AM. Patient underwent voiding trial, discharged without catheter. Instructed to return to clinic if unable to void.   Physical Exam: Physical exam performed. The pertinent findings include: Normal vital signs, no acute distress. After foley replaced, draining clear yellow urine.   Lab Tests/Imaging studies: I personally interpreted labs/imaging and the pertinent results include:  UA pending at time of discharge.   Disposition: After consideration of the diagnostic results and the patients response to treatment, I feel that emergency department workup does not suggest an emergent condition requiring admission or immediate intervention beyond what has been performed at this time. The plan is: discharge to home with urology follow up for ongoing urinary retention requiring  foley catheter placement. The patient is safe for discharge and has been instructed to return immediately for worsening symptoms, change in symptoms or any other concerns.  Final Clinical Impression(s) / ED Diagnoses Final diagnoses:  Urinary retention    Rx / DC Orders ED Discharge Orders     None      Portions of this report may have been transcribed using voice recognition software. Every effort was made to ensure accuracy; however, inadvertent computerized transcription errors may be present.    Jeanella Flattery 12/12/22 1731    Vanetta Mulders, MD 12/12/22 [id number]

## 2022-12-12 NOTE — Progress Notes (Signed)
Fill and Pull Catheter Removal  Patient is present today for a catheter removal.  Patient was cleaned and prepped in a sterile fashion of sterile water/ saline was instilled into the bladder when the patient felt the urge to urinate, 9ml of water was then drained from the balloon.  A 16FR foley cath was removed from the bladder no complications were noted .  Patient as then given some time to void on their own.  Patient can void  on their own after some time.  Patient tolerated well.  Performed by: Arville Go CMA

## 2022-12-12 NOTE — Discharge Instructions (Signed)
Frederick Andrews was seen in the ER for urinary retention.  He had over 550 mL in his bladder on ultrasound. We replaced his catheter with a 16 french foley. He can take stool softeners from the pharmacy to help with constipation which could help his retention as well. Make sure he is staying well hydrated.   Please follow up with the urologist tomorrow.

## 2022-12-18 ENCOUNTER — Telehealth: Payer: Self-pay

## 2022-12-18 NOTE — Telephone Encounter (Signed)
Patient's son called office regarding reaction patient noticed this week after infusing Iv antibiotics. States that yesterday patient complained of SOB, chest tightness, tingling in arms, and joint pain. Symptoms gradually improved throughout the day. Would like to know if they can stop antibiotic until tomorrow and discuss with provider.  Spoke with Dr. Renold Don who recommended patient continue with infusion until appt since its once a day infusion. Advised they call office if symptoms happen again. Patient's son will monitor how he is feeling  today. Juanita Laster, RMA

## 2022-12-19 ENCOUNTER — Ambulatory Visit (INDEPENDENT_AMBULATORY_CARE_PROVIDER_SITE_OTHER): Payer: 59 | Admitting: Internal Medicine

## 2022-12-19 ENCOUNTER — Telehealth: Payer: Self-pay

## 2022-12-19 ENCOUNTER — Encounter: Payer: Self-pay | Admitting: Internal Medicine

## 2022-12-19 ENCOUNTER — Other Ambulatory Visit: Payer: Self-pay

## 2022-12-19 VITALS — BP 120/74 | HR 95 | Temp 98.0°F | Wt 114.0 lb

## 2022-12-19 DIAGNOSIS — R7881 Bacteremia: Secondary | ICD-10-CM

## 2022-12-19 DIAGNOSIS — M4622 Osteomyelitis of vertebra, cervical region: Secondary | ICD-10-CM

## 2022-12-19 DIAGNOSIS — B9620 Unspecified Escherichia coli [E. coli] as the cause of diseases classified elsewhere: Secondary | ICD-10-CM

## 2022-12-19 DIAGNOSIS — T847XXD Infection and inflammatory reaction due to other internal orthopedic prosthetic devices, implants and grafts, subsequent encounter: Secondary | ICD-10-CM

## 2022-12-19 MED ORDER — CIPROFLOXACIN HCL 500 MG PO TABS: 500.0000 mg | ORAL_TABLET | Freq: Two times a day (BID) | ORAL | 0 refills | Status: DC

## 2022-12-19 MED ORDER — CIPROFLOXACIN HCL 500 MG PO TABS: 500.0000 mg | ORAL_TABLET | Freq: Two times a day (BID) | ORAL | 1 refills | Status: AC

## 2022-12-19 NOTE — Patient Instructions (Signed)
Stop ceftriaxone  We'll start ciprofloxacin 500 mg twice a day (avoid dairy within 2 hours of taking medication)   See me in 6 weeks

## 2022-12-19 NOTE — Progress Notes (Signed)
[facility]  Patient Active Problem List   Diagnosis Date Noted   Epidural abscess 11/25/2022   Bacteremia due to Escherichia coli 11/23/2022   Coronary artery calcification 11/23/2022   Aortic atherosclerosis (HCC) 11/23/2022   Normocytic anemia 11/23/2022   Bilateral chronic knee pain 04/27/2021   Hyperlipidemia 05/30/2016   Essential hypertension 05/30/2016   Chronic lung disease 05/30/2016   Tobacco use 05/30/2016      Subjective:    Patient ID: Frederick Andrews, male    DOB: [birth year], 72 y.o.   MRN: [id number]  Chief Complaint  Patient presents with   Follow-up    Tangling/ chest tightness/ no antbx today and yesterday     HPI:  Frederick Andrews is a 72 y.o. male here for hospital f/u ecoli bsi/cervical om and s/p cspine hardware stabilization   12/19/22 id clinic f/u Patient developed 4 days prior to this visit immediately after ceftriaxone infusion --> legs/feet go cold, dyspnea/chest tightness, tingling in the whole body. Sx lasts an hour Today no infusion yet and and no sx See a&P   No Known Allergies    Outpatient Medications Prior to Visit  Medication Sig Dispense Refill   albuterol (VENTOLIN HFA) 108 (90 Base) MCG/ACT inhaler Inhale 2 puffs into the lungs every 4 (four) hours as needed for wheezing.     atorvastatin (LIPITOR) 20 MG tablet Take 20 mg by mouth daily.     cefTRIAXone (ROCEPHIN) IVPB Inject 2 g into the vein daily. Indication: Cervical spine osteomyelitis/discitis  First Dose: Yes Last Day of Therapy: 01/07/2023 Labs - Once weekly:  CBC/D and BMP, Labs - Once weekly: ESR and CRP Method of administration: IV Push Method of administration may be changed at the discretion of home infusion pharmacist based upon assessment of the patient and/or caregiver's ability to self-administer the medication ordered. 41 Units 0   docusate sodium (COLACE) 100 MG capsule Take 1 capsule (100 mg total) by mouth 2 (two) times daily. 10  capsule 0   lidocaine (LIDODERM) 5 % Place 1 patch onto the skin daily. Remove & Discard patch within 12 hours or as directed by MD 30 patch 0   lidocaine (XYLOCAINE) 2 % solution Use as directed 15 mLs in the mouth or throat every 4 (four) hours as needed for mouth pain (indigestion). 100 mL 0   losartan (COZAAR) 50 MG tablet Take 50 mg by mouth daily.     menthol-cetylpyridinium (CEPACOL) 3 MG lozenge Take 1 lozenge (3 mg total) by mouth as needed for sore throat. 100 tablet 12   mupirocin ointment (BACTROBAN) 2 % Place 1 Application into the nose 2 (two) times daily. 22 g 0   nicotine (NICODERM CQ - DOSED IN MG/24 HOURS) 14 mg/24hr patch Place 1 patch (14 mg total) onto the skin daily as needed (For nicotine replacement therapy). 28 patch 0   oxyCODONE (OXY IR/ROXICODONE) 5 MG immediate release tablet Take 1 tablet (5 mg total) by mouth every 6 (six) hours as needed for moderate pain. 30 tablet 0   polyethylene glycol (MIRALAX / GLYCOLAX) 17 g packet Take 17 g by mouth daily. 14 each 0   senna (SENOKOT) 8.6 MG TABS tablet Take 1 tablet (8.6 mg total) by mouth 2 (two) times daily. 120 tablet 0   tamsulosin (FLOMAX) 0.4 MG CAPS capsule Take 1 capsule (0.4 mg total) by mouth daily. (Patient taking differently: Take 0.4 mg by mouth 2 (two) times daily.) 30  capsule 1   No facility-administered medications prior to visit.     Social History   Socioeconomic History   Marital status: Married    Spouse name: Not on file   Number of children: Not on file   Years of education: Not on file   Highest education level: Not on file  Occupational History   Not on file  Tobacco Use   Smoking status: Every Day    Current packs/day: 1.00    Average packs/day: 1 pack/day for 40.0 years (40.0 ttl pk-yrs)    Types: Cigarettes   Smokeless tobacco: Not on file   Tobacco comments:    Working on decreasing the number of cigarettes per day.  Vaping Use   Vaping status: Never Used  Substance and Sexual  Activity   Alcohol use: No    Alcohol/week: 0.0 standard drinks of alcohol   Drug use: No   Sexual activity: Not on file  Other Topics Concern   Not on file  Social History Narrative   Not on file   Social Determinants of Health   Financial Resource Strain: Low Risk  (11/20/2022)   Received from [facility]   Overall Financial Resource Strain (CARDIA)    Difficulty of Paying Living Expenses: Not hard at all  Food Insecurity: No Food Insecurity (11/25/2022)   Hunger Vital Sign    Worried About Running Out of Food in the Last Year: Never true    Ran Out of Food in the Last Year: Never true  Transportation Needs: No Transportation Needs (11/25/2022)   PRAPARE - Administrator, Civil Service (Medical): No    Lack of Transportation (Non-Medical): No  Physical Activity: Insufficiently Active (06/06/2022)   Received from [facility]   Exercise Vital Sign    Days of Exercise per Week: 2 days    Minutes of Exercise per Session: 10 min  Stress: No Stress Concern Present (06/06/2022)   Received from [facility] - Occupational Stress Questionnaire    Feeling of Stress : Only a little  Social Connections: Socially Integrated (06/06/2022)   Received from [facility]   Social Network    How would you rate your social network (family, work, friends)?: Good participation with social networks  Intimate Partner Violence: Not At Risk (11/25/2022)   Humiliation, Afraid, Rape, and Kick questionnaire    Fear of Current or Ex-Partner: No    Emotionally Abused: No    Physically Abused: No    Sexually Abused: No      Review of Systems    All other ros negative   Objective:    Wt 114 lb (51.7 kg)   BMI 20.19 kg/m  Nursing note and vital signs reviewed.  Physical Exam      General/constitutional: no distress, pleasant HEENT: Normocephalic, PER, Conj Clear, EOMI, Oropharynx clear Neck supple CV: rrr no mrg Lungs: clear to  auscultation, normal respiratory effort Abd: Soft, Nontender Ext: no edema Skin: No Rash -- anterior neck scar healed Neuro: nonfocal MSK: no peripheral joint swelling/tenderness/warmth; back spines nontender  Foley catheter intact   Central line presence: rue picc   Labs: Reviewed opat labs 8/20 cbc 3.8/11.1/283; cr 0.77; lft 36/46/184  Crp 4 <-- 17 @ 8/12 <-- 27 @ 12/02/22   Micro:  Serology:  Imaging: Reviewed    11/24/22 mri cspine Narrative & Impression  CLINICAL DATA:  Cervical radiculopathy. Infection suspected. Severe neck and shoulder pain. Bacteremia.  EXAM: MRI CERVICAL SPINE WITHOUT AND WITH CONTRAST   TECHNIQUE: Multiplanar and multiecho pulse sequences of the cervical spine, to include the craniocervical junction and cervicothoracic junction, were obtained without and with intravenous contrast.   CONTRAST:  5mL GADAVIST GADOBUTROL 1 MMOL/ML IV SOLN   COMPARISON:  CT of the cervical spine 11/22/2022   FINDINGS: Alignment: 3 mm anterolisthesis is present at C3-4. 2 mm anterolisthesis is present at C4-5. Straightening of the normal cervical lordosis is present.   Vertebrae: Signal and enhancement is present diffusely in the C4 and C5 vertebral bodies. Abnormal enhancement extends into the posterior elements both levels.   Cord: Cord is compressed the C4-5 level less than 6 mm. Increased T2 signal is present within the cord at C4 and C5.   Extensive dural enhancement extends C2 through T1-2 posteriorly. This is most prominent from C2-3 through C7. A right ventral epidural abscess measures 6 x 2 x 12 mm posterior to the C5 vertebral body. A left ventral epidural abscess measures 4 x 2 x 10 mm posterior to the C5 vertebral body. Cord size and signal is normal above the C3 vertebral level and below the C6 vertebral level.   Posterior Fossa, vertebral arteries, paraspinal tissues: Prevertebral edema and enhancement extends from the C1-2  level through C6. No abnormal fluid or enhancement extends into the mediastinum. The craniocervical junction is normal. Vertebral arteries are patent bilaterally.   Disc levels:   C2-3: Abnormal dural enhancement is present without focal stenosis.   C3-4: More prominent circumferential dural enhancement is present. Uncovertebral spurring contributes to mild right foraminal stenosis.   C4-5: Extensive epidural enhancement compresses the spinal cord to less than 6 mm. Enhancing soft tissue extends into the foramina bilaterally. Severe left foraminal stenosis is secondary to uncovertebral and facet hypertrophy.   C5-6: Circumferential epidural enhancement is present. Moderate foraminal stenosis is present bilaterally.   C6-7: Abnormal dural and epidural enhancement is present without significant central canal stenosis. Abnormal enhancement extends into the foramina.   C7-T1: Mild posterior dural enhancement is present. No focal disc disease or stenosis is present.   IMPRESSION: 1. 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 2. Extensive dural enhancement extends from the C1-2 level through T1-2 posteriorly. This is most prominent from C2-3 through C7. 3. Increased T2 signal is present within the cervical spinal cord at C4 and C5 consistent with spinal cord edema. 4. Prevertebral edema and enhancement extends from the C1-2 level through C6. No extension into the mediastinum. 5. Severe left foraminal stenosis at C4-5. 6. Moderate foraminal stenosis bilaterally at C5-6. 7. Mild right foraminal stenosis at C3-4. 8. Mild posterior dural enhancement at C7-T1 without significant central canal stenosis. 9. Abnormal enhancement extends into the foramina bilaterally at C4-5, C5-6, and C6-7.     Assessment & Plan:   Problem List Items Addressed This Visit   None Visit Diagnoses      E coli bacteremia    -  Primary   Acute osteomyelitis of cervical spine (HCC)       Hardware complicating wound infection, subsequent encounter             No orders of the defined types were placed in this encounter.    Abx: 7/27-8/21 ceftriaxone   7/27-29 vanc 7/30 cefazolin periop    72 year old male who emigrated from [place] in 1990s  presented with shoulder pain radiating to his neck and a fall admitted with:    Ecoli bacteremia -- unclear source  MRI cervical showed 6X2X 12 mm and 4X26X 10 mm epidural abscess C4/C5, extensive dural and enhancement C1-2 through T1-2, prevertebral edema C1-C6  Cervical spine om -- s/p acd c4-5 and c5-6 arthrodesis with structural fixation and allograft anterior plate fixation X5-2 on 7/30   7/26 bcx and 7/30 operative cx ecoli (S cipro, amp-sulb; no cefazolin card to reflect new mic cut off </=2; R amp, bactrim)    Repeat bcx 7/28 and 7/30 ngtd     7/27 tte with moderate ar but suspect age related valve issue rather than endocarditis. This is a very quick onset of sx and very low risk for IE from gram negative so I do not feel IE w/u warranted with tee. However, I am not opposed if primary team want to pursue and will defer to them     Shoulders imaging reviewed -- clinically not consistent with septic arthritis   ------------------------ 12/19/22 id assessment Stopped ceftriaxone yesterday due to abnormal infusion related effect to delayed onset will given cipro for another 1-2 months then cefadroxil for another 6-12 months or indefinitely We don't have cefazolin mic but we don't have high rate of cephalexin resistance in ecoli   Will call [facility] to remove picc  Start cipro today 500 po twice a day for 2 months   See me in 6 weeks    Follow-up: Return in about 1 week (around 12/26/2022).      Raymondo Band, MD [facility] 12/19/2022, 2:41 PM

## 2022-12-19 NOTE — Telephone Encounter (Signed)
Per Dr. Renold Don sent community message to [facility] team with orders to pull picc. Family is aware.  Juanita Laster, RMA

## 2022-12-19 NOTE — Telephone Encounter (Signed)
Orders confirmed by Pam with [employer].  Juanita Laster, RMA

## 2022-12-23 ENCOUNTER — Ambulatory Visit (INDEPENDENT_AMBULATORY_CARE_PROVIDER_SITE_OTHER): Payer: 59 | Admitting: Urology

## 2022-12-23 ENCOUNTER — Encounter: Payer: Self-pay | Admitting: Urology

## 2022-12-23 VITALS — BP 127/71 | HR 80

## 2022-12-23 DIAGNOSIS — R339 Retention of urine, unspecified: Secondary | ICD-10-CM

## 2022-12-23 MED ORDER — TAMSULOSIN HCL 0.4 MG PO CAPS
0.4000 mg | ORAL_CAPSULE | Freq: Two times a day (BID) | ORAL | 11 refills | Status: DC
Start: 2022-12-23 — End: 2023-03-03

## 2022-12-23 NOTE — Progress Notes (Signed)
Assessment: 1. Urinary retention     Plan: Continue tamsulosin BID He is currently receiving Cipro Foley removed today Patient instructed on clean intermittent catheterization and supplies given Return to office in 10 days    Chief Complaint:  Chief Complaint  Patient presents with   Urinary Retention    History of Present Illness:  Frederick Andrews is a 72 y.o. male who is seen for further evaluation of urinary retention. 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  He initially voided postop but then developed urinary retention.  Bladder scan showed a volume of 970 mL.  He underwent I&O catheterization with initial volume of 750 mL.  He was started on tamsulosin.  A Foley catheter was placed and he was discharged with the Foley catheter. No prior history of urinary retention.  He did notice some gradual worsening of his symptoms associated with his neck pain prior to his hospitalization.  CT abdomen and pelvis with contrast from 11/23/2022 showed normal kidneys without mass or obstruction, mild bladder distention, and normal-appearing prostate. His foley was removed on 12/05/22 following a voiding trial in the office.  He was able to void 1-2 times following foley removal.  He presented to ED later on 12/05/22 with urinary retention.  Foley placed with return of 1200 ml. His tamsulosin was increased to BID. His foley was removed on 12/12/22 after a successful voiding trial in the office.   He was seen in the ER on 12/12/22 with retention and a foley was replaced.  His daughter reports that he was having problems with constipation which has since resolved.  His catheter has been draining well.  No gross hematuria.  He continues on tamsulosin twice daily.  He is currently on Cipro twice daily per infectious disease.  The patient's daughter interpreted during the visit today.  Portions of the above documentation were copied from a prior  visit for review purposes only.   Past Medical History:  Past Medical History:  Diagnosis Date   Hyperlipemia    Hypertension     Past Surgical History:  Past Surgical History:  Procedure Laterality Date   ANTERIOR CERVICAL DECOMPRESSION FOR EPIDURAL ABSCESS N/A 11/25/2022   Procedure: ANTERIOR CERVICAL DECOMPRESSION FUSION FOR EPIDURAL ABSCESS CERVICAL FOUR-FIVE AND CERVICAL FIVE-SIX;  Surgeon: Barnett Abu, MD;  Location: [facility] OR;  Service: Neurosurgery;  Laterality: N/A;    Allergies:  No Known Allergies  Family History:  Family History  Problem Relation Age of Onset   Heart disease Mother    Hypertension Mother    Hyperlipidemia Mother    Hyperlipidemia Other     Social History:  Social History   Tobacco Use   Smoking status: Every Day    Current packs/day: 1.00    Average packs/day: 1 pack/day for 40.0 years (40.0 ttl pk-yrs)    Types: Cigarettes   Tobacco comments:    Working on decreasing the number of cigarettes per day.  Vaping Use   Vaping status: Never Used  Substance Use Topics   Alcohol use: No    Alcohol/week: 0.0 standard drinks of alcohol   Drug use: No    ROS: Constitutional:  Negative for fever, chills, weight loss CV: Negative for chest pain, previous MI, hypertension Respiratory:  Negative for shortness of breath, wheezing, sleep apnea, frequent cough GI:  Negative for nausea, vomiting, bloody stool, GERD   Physical exam: BP 127/71   Pulse 80  GENERAL APPEARANCE:  Well appearing, well  developed, well nourished, NAD HEENT:  Atraumatic, normocephalic, oropharynx clear NECK:  Supple without lymphadenopathy or thyromegaly ABDOMEN:  Soft, non-tender, no masses EXTREMITIES:  Moves all extremities well, without clubbing, cyanosis, or edema NEUROLOGIC:  Alert and oriented x 3, normal gait, CN II-XII grossly intact MENTAL STATUS:  appropriate BACK:  Non-tender to palpation, No CVAT SKIN:  Warm, dry, and intact GU:  foley draining clear yellow  urine  Results: None  Procedure:  VOIDING TRIAL  A voiding trial was performed in the office today.   Volume of sterile water instilled: 300 mL Foley catheter removed intact. Volume voided by patient: 300 mL Instructed to return to office if has not voided by 4 PM

## 2022-12-27 ENCOUNTER — Telehealth: Payer: Self-pay

## 2022-12-27 NOTE — Telephone Encounter (Signed)
Spoke with Maureen Ralphs, notified her that 30 days of cipro should be ready at the pharmacy.   Sandie Ano, RN

## 2022-12-27 NOTE — Telephone Encounter (Signed)
Received voicemail from patient's daughter stating that patient was about to run out of ciprofloxacin. Per chart review, 10 day supply was dispensed on 8/22. Called [facility] to determine why full prescription was not filled. Pharmacy tech stated they received one prescription for #20 tablets and that they did not receive the prescription for #60 tablets until the patient had already picked up the first order.   Per Dr. Orlando Penner note, patient is to be on cipro for the next 2 months, Dr. Renold Don discontinued #20 prescription. Asked [facility] to fill #60 prescription. It should be ready within the next hour without issue on insurance end.   Called Maureen Ralphs back to let her know, no answer and unable to leave message.   Sandie Ano, RN

## 2023-01-01 ENCOUNTER — Ambulatory Visit: Payer: 59 | Admitting: Urology

## 2023-01-01 ENCOUNTER — Encounter: Payer: Self-pay | Admitting: Urology

## 2023-01-01 VITALS — BP 131/72 | HR 88

## 2023-01-01 DIAGNOSIS — Z87898 Personal history of other specified conditions: Secondary | ICD-10-CM

## 2023-01-01 DIAGNOSIS — R339 Retention of urine, unspecified: Secondary | ICD-10-CM

## 2023-01-01 LAB — BLADDER SCAN AMB NON-IMAGING

## 2023-01-01 NOTE — Progress Notes (Signed)
Assessment: 1. History of urinary retention     Plan: Continue tamsulosin BID Return to office in 2 months  Chief Complaint:  Chief Complaint  Patient presents with   Urinary Retention    History of Present Illness:  Frederick Andrews is a 72 y.o. male who is seen for further evaluation of urinary retention. 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  He initially voided postop but then developed urinary retention.  Bladder scan showed a volume of 970 mL.  He underwent I&O catheterization with initial volume of 750 mL.  He was started on tamsulosin.  A Foley catheter was placed and he was discharged with the Foley catheter. No prior history of urinary retention.  He did notice some gradual worsening of his symptoms associated with his neck pain prior to his hospitalization.  CT abdomen and pelvis with contrast from 11/23/2022 showed normal kidneys without mass or obstruction, mild bladder distention, and normal-appearing prostate. His foley was removed on 12/05/22 following a voiding trial in the office.  He was able to void 1-2 times following foley removal.  He presented to ED later on 12/05/22 with urinary retention.  Foley placed with return of 1200 ml. His tamsulosin was increased to BID. His foley was removed on 12/12/22 after a successful voiding trial in the office.   He was seen in the ER on 12/12/22 with retention and a foley was replaced.  His daughter reports that he was having problems with constipation which has since resolved.  He continued on tamsulosin twice daily.  He was on Cipro twice daily per infectious disease. His Foley was removed after a successful voiding trial on 12/23/2022.  He was instructed on clean intermittent catheterization and supplies were given for use on a as needed basis.  He returns today for follow-up.  He has been voiding spontaneously since his last visit.  He has not required intermittent  catheterization.  He continues on tamsulosin.  He is voiding with a good stream.  No dysuria or gross hematuria. IPSS = 2  The patient's daughter interpreted during the visit today.  Portions of the above documentation were copied from a prior visit for review purposes only.   Past Medical History:  Past Medical History:  Diagnosis Date   Hyperlipemia    Hypertension     Past Surgical History:  Past Surgical History:  Procedure Laterality Date   ANTERIOR CERVICAL DECOMPRESSION FOR EPIDURAL ABSCESS N/A 11/25/2022   Procedure: ANTERIOR CERVICAL DECOMPRESSION FUSION FOR EPIDURAL ABSCESS CERVICAL FOUR-FIVE AND CERVICAL FIVE-SIX;  Surgeon: Barnett Abu, MD;  Location: [facility] OR;  Service: Neurosurgery;  Laterality: N/A;    Allergies:  No Known Allergies  Family History:  Family History  Problem Relation Age of Onset   Heart disease Mother    Hypertension Mother    Hyperlipidemia Mother    Hyperlipidemia Other     Social History:  Social History   Tobacco Use   Smoking status: Every Day    Current packs/day: 1.00    Average packs/day: 1 pack/day for 40.0 years (40.0 ttl pk-yrs)    Types: Cigarettes   Tobacco comments:    Working on decreasing the number of cigarettes per day.  Vaping Use   Vaping status: Never Used  Substance Use Topics   Alcohol use: No    Alcohol/week: 0.0 standard drinks of alcohol   Drug use: No    ROS: Constitutional:  Negative for fever, chills, weight loss  CV: Negative for chest pain, previous MI, hypertension Respiratory:  Negative for shortness of breath, wheezing, sleep apnea, frequent cough GI:  Negative for nausea, vomiting, bloody stool, GERD   Physical exam: BP 131/72   Pulse 88  GENERAL APPEARANCE:  Well appearing, well developed, well nourished, NAD HEENT:  Atraumatic, normocephalic, oropharynx clear NECK:  Supple without lymphadenopathy or thyromegaly ABDOMEN:  Soft, non-tender, no masses EXTREMITIES:  Moves all extremities  well, without clubbing, cyanosis, or edema NEUROLOGIC:  Alert and oriented x 3, normal gait, CN II-XII grossly intact MENTAL STATUS:  appropriate BACK:  Non-tender to palpation, No CVAT SKIN:  Warm, dry, and intact  Results: U/A: negative  PVR: 38 ml

## 2023-01-01 NOTE — Progress Notes (Unsigned)
Cardiology Office Note:  .   Date:  01/02/2023  ID:  Frederick Andrews, DOB 1951-02-07, MRN [id number] PCP: Rosemary Holms  [facility] Providers Cardiologist:  Parke Poisson, MD    History of Present Illness: .   Frederick Andrews is a 72 y.o. male with past medical history of hypertension, hyperlipidemia, nicotine dependence and paroxysmal atrial fibrillation.   Frederick Andrews was admitted to [facility] on 11/22/22. Prior to admission patient had been complaining of nausea, low-grade fever, shoulder pain that had started a week prior to admission.  He had seen his PCP who initially prescribed him pain medications for his shoulder.  On follow-up with his PCP he was noted to hypotensive and tachycardic and was referred to the emergency room.  On ED workup he had positive blood cultures.  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  He was transferred to [facility] for emergent anterior cervical decompression C4-C5, C5-C6 arthrodesis with distal stable fixation and allograft anterior plate fixation of C4-C6 by Dr. Danielle Dess.  Was no evidence of septic arthritis, he was started on IV antibiotics.  Cardiology was consulted due to new onset of atrial fibrillation.  He had 2 occurrences of atrial fibrillation, the first being very brief for about 15 minutes, separate episode was sustained for about 1 and half hours, he was started on diltiazem drip and eventually converted back to sinus tachycardia, heart rate 110.  Overall patient was asymptomatic.  Echocardiogram showed preserved EF 55 to 60%, mildly elevated RVSP at 30.7, moderate AR and severe PR.  CTA of the chest showed no evidence of pulmonary embolism, however did show coronary artery calcification.  Today he presents for follow-up with his daughter.  He reports that he is doing very well overall.  He has no concerns or complaints today.  He denies any feeling of palpitations or  increased heart rate.  He denies chest pain or shortness of breath they do report an episode of some shortness of breath and chest discomfort when he had an allergic reaction to his ceftriaxone which he was initially on via PICC line.  He notes that his back pain is overall improving is still somewhat achy.  He reports that he went to church this past weekend and tolerated it very well, no chest pain or shortness of breath.  ROS: Today he denies chest pain, shortness of breath, lower extremity edema, fatigue, palpitations, melena, hematuria, hemoptysis, diaphoresis, weakness, presyncope, syncope, orthopnea, and PND.   Studies Reviewed: .       Cardiac Studies & Procedures       ECHOCARDIOGRAM  ECHOCARDIOGRAM COMPLETE 11/23/2022  Narrative ECHOCARDIOGRAM REPORT    Patient Name:   [facility]  Date of Exam: 11/23/2022 Medical Rec #:  [id number]  Height:       63.0 in Accession #:    [id number] Weight:       121.3 lb Date of Birth:  May 27, 1950  BSA:          1.563 m Patient Age:    71 years   BP:           157/69 mmHg Patient Gender: M          HR:           82 bpm. Exam Location:  Inpatient  Procedure: 2D Echo, Color Doppler and Cardiac Doppler  Indications:    Bacteremia  History:  Patient has no prior history of Echocardiogram examinations. Risk Factors:Hypertension and Dyslipidemia.  Sonographer:    Irving Burton Senior RDCS Referring Phys: [id number] DAVID MANUEL ORTIZ   Sonographer Comments: Suboptimal apical window due to lung interference. IMPRESSIONS   1. Left ventricular ejection fraction, by estimation, is 55 to 60%. The left ventricle has normal function. The left ventricle has no regional wall motion abnormalities. Left ventricular diastolic parameters were normal. 2. Right ventricular systolic function is normal. The right ventricular size is normal. There is mildly elevated pulmonary artery systolic pressure. The estimated right ventricular systolic pressure is 38.7  mmHg. 3. Left atrial size was mildly dilated. 4. The mitral valve is normal in structure. Mild mitral valve regurgitation. No evidence of mitral stenosis. 5. The aortic valve is tricuspid. There is mild thickening of the aortic valve. Aortic valve regurgitation is moderate. No aortic stenosis is present. 6. 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 7. The inferior vena cava is normal in size with <50% respiratory variability, suggesting right atrial pressure of 8 mmHg.  Comparison(s): Regurgitant valve lesions raise concern for endocarditis in setting of bacteremia, consider TEE if clinically indicated.  FINDINGS Left Ventricle: Left ventricular ejection fraction, by estimation, is 55 to 60%. The left ventricle has normal function. The left ventricle has no regional wall motion abnormalities. The left ventricular internal cavity size was normal in size. There is borderline left ventricular hypertrophy. Left ventricular diastolic parameters were normal.  Right Ventricle: The right ventricular size is normal. No increase in right ventricular wall thickness. Right ventricular systolic function is normal. There is mildly elevated pulmonary artery systolic pressure. The tricuspid regurgitant velocity is 2.77 m/s, and with an assumed right atrial pressure of 8 mmHg, the estimated right ventricular systolic pressure is 38.7 mmHg.  Left Atrium: Left atrial size was mildly dilated.  Right Atrium: Right atrial size was normal in size.  Pericardium: There is no evidence of pericardial effusion.  Mitral Valve: The mitral valve is normal in structure. Mild mitral valve regurgitation. No evidence of mitral valve stenosis.  Tricuspid Valve: The tricuspid valve is normal in structure. Tricuspid valve regurgitation is mild . No evidence of tricuspid stenosis.  Aortic Valve: The aortic valve is tricuspid. There is mild thickening of the aortic valve. Aortic valve regurgitation is moderate. Aortic regurgitation  PHT measures 416 msec. No aortic stenosis is present.  Pulmonic Valve: The pulmonic valve was not well visualized. 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 No evidence of pulmonic stenosis.  Aorta: The aortic root and ascending aorta are structurally normal, with no evidence of dilitation.  Venous: The inferior vena cava is normal in size with less than 50% respiratory variability, suggesting right atrial pressure of 8 mmHg.  IAS/Shunts: The atrial septum is grossly normal.   LEFT VENTRICLE PLAX 2D LVIDd:         4.90 cm   Diastology LVIDs:         3.20 cm   LV e' medial:    10.40 cm/s LV PW:         1.00 cm   LV E/e' medial:  8.0 LV IVS:        1.00 cm   LV e' lateral:   11.70 cm/s LVOT diam:     2.10 cm   LV E/e' lateral: 7.1 LV SV:         73 LV SV Index:   47 LVOT Area:     3.46 cm   RIGHT VENTRICLE RV S prime:  9.90 cm/s TAPSE (M-mode): 2.1 cm  LEFT ATRIUM             Index        RIGHT ATRIUM           Index LA diam:        3.70 cm 2.37 cm/m   RA Area:     16.10 cm LA Vol (A2C):   57.8 ml 36.98 ml/m  RA Volume:   35.40 ml  22.65 ml/m LA Vol (A4C):   48.4 ml 30.96 ml/m LA Biplane Vol: 53.9 ml 34.48 ml/m AORTIC VALVE LVOT Vmax:   97.30 cm/s LVOT Vmean:  72.600 cm/s LVOT VTI:    0.212 m AI PHT:      416 msec  AORTA Ao Root diam: 3.60 cm Ao Asc diam:  3.60 cm  MITRAL VALVE               TRICUSPID VALVE MV Area (PHT): 3.42 cm    TR Peak grad:   30.7 mmHg MV Decel Time: 222 msec    TR Vmax:        277.00 cm/s MV E velocity: 83.60 cm/s MV A velocity: 78.00 cm/s  SHUNTS MV E/A ratio:  1.07        Systemic VTI:  0.21 m Systemic Diam: 2.10 cm  Weston Brass MD Electronically signed by Weston Brass MD Signature Date/Time: 11/23/2022/5:24:19 PM    Final             Risk Assessment/Calculations:    CHA2DS2-VASc Score = 3   This indicates a 3.2% annual risk of stroke. The patient's score is based upon: CHF History: 0 HTN History: 1 Diabetes  History: 0 Stroke History: 0 Vascular Disease History: 1 Age Score: 1 Gender Score: 0            Physical Exam:   VS:  BP 132/60 (BP Location: Left Arm, Patient Position: Sitting, Cuff Size: Normal)   Pulse 82   Ht 5\' 2"  (1.575 m)   Wt 116 lb 12.8 oz (53 kg)   SpO2 96%   BMI 21.36 kg/m    Wt Readings from Last 3 Encounters:  01/02/23 116 lb 12.8 oz (53 kg)  12/19/22 114 lb (51.7 kg)  12/05/22 125 lb 10.6 oz (57 kg)    GEN: Well nourished, well developed in no acute distress NECK: No JVD; No carotid bruits CARDIAC: RRR, no murmurs, rubs, gallops RESPIRATORY:  Clear to auscultation without rales, wheezing or rhonchi  ABDOMEN: Soft, non-tender, non-distended EXTREMITIES:  No edema; No deformity   ASSESSMENT AND PLAN: .    Paroxysmal atrial fibrillation: Noted to have 2 episodes of atrial fibrillation during hospitalization in early August.  First episode lasting 15 minutes and second for 1 and half hours, he was started on IV diltiazem and converted back to sinus tachycardia. CHA2DS2-VASc Score = 3 Therefore, the patient's annual risk of stroke is 3.2 %.  However given 2 relatively short bouts of atrial fibrillation in setting of infection was not started on anticoagulation at that time.  It was noted that he did have a mildly dilated left atria possibly supporting short-term chronicity.  Per neurosurgery was okay to start anticoagulation if needed. Preliminary cardiac monitor results showed no evidence of atrial fibrillation. His average heart rate was 85 bpm, ranged from 35 bpm to 156 bpm. Predominant underlying rhythm was sinus rhythm. There were three runs of SVT, fastest and longest lasting 6 beats with a rate of 156.  Second degree AV block Mobitz I was present. He had no triggered events.  Given no evidence of atrial fibrillation will not start him on anticoagulation at this time.  Patient will monitor for increased heart rate or palpitations and notify office.  Severe pulmonary  valve regurgitation/moderate AR: Noted on echo during admission.  Today he denies any shortness of breath or chest pain.  Will have him repeat an echo in 6 months for monitoring.  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  Underwent anterior cervical decompression.  Blood cultures were positive for E. coli.  Infectious disease team following and recommended 6 weeks course of ceftriaxone, unfortunately he developed an allergy has now started on Cipro. PICC line was previously placed for IV antibiotics, has since been discontinued.  Coronary calcification/aortic atherosclerosis: Coronary calcifications noted on CTA. Stable with no anginal symptoms. No indication for ischemic evaluation.  Lipid profile in 11/29/2022 indicated total cholesterol 124, HDL 23 and LDL of 81. Given calcifications on CT would recommend goal of 70. Heart healthy diet and regular cardiovascular exercise encouraged.  Patient has asked if he can restart his aspirin 81 mg daily, approved to restart.  Will increase his atorvastatin to 40 mg daily.  He will return for fasting lipid profile and LFTs in 2 months.  Continue losartan 50 mg daily.  Hypertension: Blood pressure today 132/60.  Recommended low-sodium diet. Continue losartan 50 mg daily.  Nicotine dependence: Patient reports he has stopped smoking, congratulated.   Urinary retention: Returned to the ED on 12/05/22 and 12/12/22 for urinary retention. Has since followed up with urology and started on Tamsulosin twice daily, is able to self cath if needed. Continue to follow up urology.   Language Barrier: Patient speaks [place]  Interpreter services was offered to patient.  Patient and his daughter declined use of interpreter services today.       Dispo: Follow up with Dr. Jacques Navy in three months.   Signed, Rip Harbour, NP

## 2023-01-02 ENCOUNTER — Ambulatory Visit: Payer: 59 | Attending: General Practice | Admitting: Cardiology

## 2023-01-02 ENCOUNTER — Encounter: Payer: Self-pay | Admitting: General Practice

## 2023-01-02 VITALS — BP 132/60 | HR 82 | Ht 62.0 in | Wt 116.8 lb

## 2023-01-02 DIAGNOSIS — I2584 Coronary atherosclerosis due to calcified coronary lesion: Secondary | ICD-10-CM

## 2023-01-02 DIAGNOSIS — I2510 Atherosclerotic heart disease of native coronary artery without angina pectoris: Secondary | ICD-10-CM

## 2023-01-02 DIAGNOSIS — I371 Nonrheumatic pulmonary valve insufficiency: Secondary | ICD-10-CM

## 2023-01-02 DIAGNOSIS — I351 Nonrheumatic aortic (valve) insufficiency: Secondary | ICD-10-CM

## 2023-01-02 DIAGNOSIS — I700 Atherosclerosis of aorta: Secondary | ICD-10-CM | POA: Diagnosis not present

## 2023-01-02 DIAGNOSIS — E785 Hyperlipidemia, unspecified: Secondary | ICD-10-CM | POA: Diagnosis not present

## 2023-01-02 DIAGNOSIS — I480 Paroxysmal atrial fibrillation: Secondary | ICD-10-CM

## 2023-01-02 DIAGNOSIS — I10 Essential (primary) hypertension: Secondary | ICD-10-CM | POA: Diagnosis not present

## 2023-01-02 MED ORDER — ATORVASTATIN CALCIUM 40 MG PO TABS: 40.0000 mg | ORAL_TABLET | Freq: Every day | ORAL | 1 refills | Status: AC

## 2023-01-02 NOTE — Patient Instructions (Addendum)
Medication Instructions:  INCREASE ATORVASTATIN 40MG  DAILY MAY USE ASPIRIN 81MG  *If you need a refill on your cardiac medications before your next appointment, please call your pharmacy*  Lab Work: FASTING LIPID AND LFT IN 2 MONTHS  If you have labs (blood work) drawn today and your tests are completely normal, you will receive your results only by:  MyChart Message (if you have MyChart) OR  A paper copy in the mail If you have any lab test that is abnormal or we need to change your treatment, we will call you to review the results.  Testing/Procedures: Your physician has requested that you have an echocardiogram. Echocardiography-IN 6 MONTHS is a painless test that uses sound waves to create images of your heart. It provides your doctor with information about the size and shape of your heart and how well your heart's chambers and valves are working. This procedure takes approximately one hour. There are no restrictions for this procedure. Please do NOT wear cologne, perfume, aftershave, or lotions (deodorant is allowed). Please arrive 15 minutes prior to your appointment time.   Follow-Up: At [facility], you and your health needs are our priority.  As part of our continuing mission to provide you with exceptional heart care, we have created designated Provider Care Teams.  These Care Teams include your primary Cardiologist (physician) and Advanced Practice Providers (APPs -  Physician Assistants and Nurse Practitioners) who all work together to provide you with the care you need, when you need it.  Your next appointment:   05-06-23 at 8:40 AM   Provider:   Parke Poisson, MD

## 2023-01-06 LAB — URINALYSIS, ROUTINE W REFLEX MICROSCOPIC
Bilirubin, UA: NEGATIVE
Glucose, UA: NEGATIVE
Ketones, UA: NEGATIVE
Leukocytes,UA: NEGATIVE
Nitrite, UA: NEGATIVE
Protein,UA: NEGATIVE
RBC, UA: NEGATIVE
Specific Gravity, UA: 1.02 (ref 1.005–1.030)
Urobilinogen, Ur: 0.2 mg/dL (ref 0.2–1.0)
pH, UA: 6 (ref 5.0–7.5)

## 2023-01-27 ENCOUNTER — Telehealth: Payer: Self-pay

## 2023-01-27 NOTE — Telephone Encounter (Signed)
Patient's daughter left voicemail stating pharmacy is requiring new prescription on cipro. Refill send on 8/22. Spoke with pharmacy tech who stated they do have on refill on file. Will work on filling this today.  Maureen Ralphs (daughter) has been updated. Juanita Laster, RMA

## 2023-01-30 ENCOUNTER — Encounter: Payer: Self-pay | Admitting: Internal Medicine

## 2023-01-30 ENCOUNTER — Other Ambulatory Visit: Payer: Self-pay

## 2023-01-30 ENCOUNTER — Ambulatory Visit (INDEPENDENT_AMBULATORY_CARE_PROVIDER_SITE_OTHER): Payer: 59 | Admitting: Internal Medicine

## 2023-01-30 VITALS — BP 150/70 | HR 67 | Resp 16 | Ht 62.0 in | Wt 123.0 lb

## 2023-01-30 DIAGNOSIS — T8463XD Infection and inflammatory reaction due to internal fixation device of spine, subsequent encounter: Secondary | ICD-10-CM | POA: Diagnosis not present

## 2023-01-30 DIAGNOSIS — M4622 Osteomyelitis of vertebra, cervical region: Secondary | ICD-10-CM | POA: Diagnosis not present

## 2023-01-30 DIAGNOSIS — T847XXD Infection and inflammatory reaction due to other internal orthopedic prosthetic devices, implants and grafts, subsequent encounter: Secondary | ICD-10-CM

## 2023-01-30 DIAGNOSIS — M869 Osteomyelitis, unspecified: Secondary | ICD-10-CM

## 2023-01-30 NOTE — Progress Notes (Signed)
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   MRN: [id number]  No chief complaint on file.   HPI:  Frederick Andrews is a 72 y.o. male here for hospital f/u ecoli bsi/cervical om and s/p cspine hardware stabilization   12/19/22 id clinic f/u Patient developed 4 days prior to this visit immediately after ceftriaxone infusion --> legs/feet go cold, dyspnea/chest tightness, tingling in the whole body. Sx lasts an hour Today no infusion yet and and no sx See a&P  01/30/23 id clinic assessment See a&p for detail. Switched to cipro during 8/22 due to adverse effect to ceftriaxone  Allergies  Allergen Reactions   Rocephin [Ceftriaxone] Shortness Of Breath      Outpatient Medications Prior to Visit  Medication Sig Dispense Refill   albuterol (VENTOLIN HFA) 108 (90 Base) MCG/ACT inhaler Inhale 2 puffs into the lungs as needed for wheezing.     atorvastatin (LIPITOR) 40 MG tablet Take 1 tablet (40 mg total) by mouth daily. 90 tablet 1   ciprofloxacin (CIPRO) 500 MG tablet Take 1 tablet (500 mg total) by mouth 2 (two) times daily. 60 tablet 1   tamsulosin (FLOMAX) 0.4 MG CAPS capsule Take 1 capsule (0.4 mg total) by mouth 2 (two) times daily. 60 capsule 11   docusate sodium (COLACE) 100 MG capsule Take 1 capsule (100 mg total) by mouth 2 (two) times daily. (Patient not taking: Reported on 01/02/2023) 10 capsule 0   lidocaine (LIDODERM) 5 % Place 1 patch onto the skin daily. Remove & Discard patch within  12 hours or as directed by MD (Patient not taking: Reported on 01/02/2023) 30 patch 0   lidocaine (XYLOCAINE) 2 % solution Use as directed 15 mLs in the mouth or throat every 4 (four) hours as needed for mouth pain (indigestion). (Patient not taking: Reported on 01/02/2023) 100 mL 0   losartan (COZAAR) 50 MG tablet Take 50 mg by mouth daily.     menthol-cetylpyridinium (CEPACOL) 3 MG lozenge Take 1 lozenge (3 mg total) by mouth as needed for sore throat. (Patient not taking: Reported on 01/02/2023) 100 tablet 12   mupirocin ointment (BACTROBAN) 2 % Place 1 Application into the nose 2 (two) times daily. (Patient not taking: Reported on 01/02/2023) 22 g 0   nicotine (NICODERM CQ - DOSED IN MG/24 HOURS) 14 mg/24hr patch Place 1 patch (14 mg total) onto the skin daily as needed (For nicotine replacement therapy). (Patient not taking: Reported on 01/02/2023) 28 patch 0   oxyCODONE (OXY IR/ROXICODONE) 5 MG immediate release tablet Take 1 tablet (5 mg total) by mouth every 6 (six) hours as needed for moderate pain. (Patient not taking: Reported on 01/02/2023) 30 tablet 0   polyethylene glycol (MIRALAX / GLYCOLAX) 17 g packet Take 17 g by mouth daily. (Patient not taking: Reported on 01/02/2023) 14 each 0   senna (SENOKOT) 8.6 MG TABS tablet Take 1 tablet (8.6 mg total) by mouth 2 (two) times daily. (Patient not taking:  Reported on 01/02/2023) 120 tablet 0   No facility-administered medications prior to visit.     Social History   Socioeconomic History   Marital status: Married    Spouse name: Not on file   Number of children: Not on file   Years of education: Not on file   Highest education level: Not on file  Occupational History   Not on file  Tobacco Use   Smoking status: Every Day    Current packs/day: 1.00    Average packs/day: 1 pack/day for 40.0 years (40.0 ttl pk-yrs)    Types: Cigarettes   Smokeless tobacco: Not on file   Tobacco comments:    Working on decreasing the number of cigarettes per day.   Vaping Use   Vaping status: Never Used  Substance and Sexual Activity   Alcohol use: No    Alcohol/week: 0.0 standard drinks of alcohol   Drug use: No   Sexual activity: Not on file  Other Topics Concern   Not on file  Social History Narrative   Not on file   Social Determinants of Health   Financial Resource Strain: Low Risk  (11/20/2022)   Received from [facility]   Overall Financial Resource Strain (CARDIA)    Difficulty of Paying Living Expenses: Not hard at all  Food Insecurity: No Food Insecurity (11/25/2022)   Hunger Vital Sign    Worried About Running Out of Food in the Last Year: Never true    Ran Out of Food in the Last Year: Never true  Transportation Needs: No Transportation Needs (11/25/2022)   PRAPARE - Administrator, Civil Service (Medical): No    Lack of Transportation (Non-Medical): No  Physical Activity: Insufficiently Active (06/06/2022)   Received [facility]   Exercise Vital Sign    Days of Exercise per Week: 2 days    Minutes of Exercise per Session: 10 min  Stress: No Stress Concern Present (06/06/2022)   Received from [facility] - Occupational Stress Questionnaire    Feeling of Stress : Only a little  Social Connections: Socially Integrated (06/06/2022)   Received from [facility]   Social Network    How would you rate your social network (family, work, friends)?: Good participation with social networks  Intimate Partner Violence: Not At Risk (11/25/2022)   Humiliation, Afraid, Rape, and Kick questionnaire    Fear of Current or Ex-Partner: No    Emotionally Abused: No    Physically Abused: No    Sexually Abused: No      Review of Systems    All other ros negative   Objective:    Resp 16   Ht 5\' 2"  (1.575 m)   Wt 123 lb (55.8 kg)   BMI 22.50 kg/m  Nursing note and vital signs reviewed.  Physical Exam      General/constitutional: no distress, pleasant HEENT:  Normocephalic, PER, Conj Clear, EOMI, Oropharynx clear Neck supple CV: rrr no mrg Lungs: clear to auscultation, normal respiratory effort Abd: Soft, Nontender Ext: no edema Skin: No Rash Neuro: nonfocal MSK: no peripheral joint swelling/tenderness/warmth; back spines nontender     Labs: Reviewed opat labs 8/20 cbc 3.8/11.1/283; cr 0.77; lft 36/46/184  Crp 4 <-- 17 @ 8/12 <-- 27 @ 12/02/22   Micro:  Serology:  Imaging: Reviewed    11/24/22 mri cspine Narrative & Impression  CLINICAL DATA:  Cervical radiculopathy. Infection suspected. Severe neck and shoulder pain. Bacteremia.  EXAM: MRI CERVICAL SPINE WITHOUT AND WITH CONTRAST   TECHNIQUE: Multiplanar and multiecho pulse sequences of the cervical spine, to include the craniocervical junction and cervicothoracic junction, were obtained without and with intravenous contrast.   CONTRAST:  5mL GADAVIST GADOBUTROL 1 MMOL/ML IV SOLN   COMPARISON:  CT of the cervical spine 11/22/2022   FINDINGS: Alignment: 3 mm anterolisthesis is present at C3-4. 2 mm anterolisthesis is present at C4-5. Straightening of the normal cervical lordosis is present.   Vertebrae: Signal and enhancement is present diffusely in the C4 and C5 vertebral bodies. Abnormal enhancement extends into the posterior elements both levels.   Cord: Cord is compressed the C4-5 level less than 6 mm. Increased T2 signal is present within the cord at C4 and C5.   Extensive dural enhancement extends C2 through T1-2 posteriorly. This is most prominent from C2-3 through C7. A right ventral epidural abscess measures 6 x 2 x 12 mm posterior to the C5 vertebral body. A left ventral epidural abscess measures 4 x 2 x 10 mm posterior to the C5 vertebral body. Cord size and signal is normal above the C3 vertebral level and below the C6 vertebral level.   Posterior Fossa, vertebral arteries, paraspinal tissues: Prevertebral edema and enhancement extends from the  C1-2 level through C6. No abnormal fluid or enhancement extends into the mediastinum. The craniocervical junction is normal. Vertebral arteries are patent bilaterally.   Disc levels:   C2-3: Abnormal dural enhancement is present without focal stenosis.   C3-4: More prominent circumferential dural enhancement is present. Uncovertebral spurring contributes to mild right foraminal stenosis.   C4-5: Extensive epidural enhancement compresses the spinal cord to less than 6 mm. Enhancing soft tissue extends into the foramina bilaterally. Severe left foraminal stenosis is secondary to uncovertebral and facet hypertrophy.   C5-6: Circumferential epidural enhancement is present. Moderate foraminal stenosis is present bilaterally.   C6-7: Abnormal dural and epidural enhancement is present without significant central canal stenosis. Abnormal enhancement extends into the foramina.   C7-T1: Mild posterior dural enhancement is present. No focal disc disease or stenosis is present.   IMPRESSION: 1. 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 2. Extensive dural enhancement extends from the C1-2 level through T1-2 posteriorly. This is most prominent from C2-3 through C7. 3. Increased T2 signal is present within the cervical spinal cord at C4 and C5 consistent with spinal cord edema. 4. Prevertebral edema and enhancement extends from the C1-2 level through C6. No extension into the mediastinum. 5. Severe left foraminal stenosis at C4-5. 6. Moderate foraminal stenosis bilaterally at C5-6. 7. Mild right foraminal stenosis at C3-4. 8. Mild posterior dural enhancement at C7-T1 without significant central canal stenosis. 9. Abnormal enhancement extends into the foramina bilaterally at C4-5, C5-6, and C6-7.     Assessment & Plan:   Problem List Items Addressed This Visit   None Visit  Diagnoses     Hardware complicating wound infection, subsequent encounter    -  Primary   Relevant Orders   CBC w/Diff   COMPLETE METABOLIC PANEL WITH GFR   C-reactive protein   Osteomyelitis, unspecified site, unspecified type (HCC)              No orders of the defined types were placed in this encounter.    Abx: 8/22-c cipro  7/27-8/21 ceftriaxone   7/27-29 vanc  7/30 cefazolin periop    72 year old male who emigrated from [place] in 1990s presented with shoulder pain radiating to his neck and a fall admitted 11/22/22 with:    Ecoli bacteremia -- unclear source  MRI cervical showed 6X2X 12 mm and 4X26X 10 mm epidural abscess C4/C5, extensive dural and enhancement C1-2 through T1-2, prevertebral edema C1-C6  Cervical spine om -- s/p acd c4-5 and c5-6 arthrodesis with structural fixation and allograft anterior plate fixation Z6-1 on 7/30   7/26 bcx and 7/30 operative cx ecoli (S cipro, amp-sulb; no cefazolin card to reflect new mic cut off </=2; R amp, bactrim)    Repeat bcx 7/28 and 7/30 ngtd     7/27 tte with moderate ar but suspect age related valve issue rather than endocarditis. This is a very quick onset of sx and very low risk for IE from gram negative so I do not feel IE w/u warranted with tee. However, I am not opposed if primary team want to pursue and will defer to them     Shoulders imaging reviewed -- clinically not consistent with septic arthritis   ------------------------ 12/19/22 id assessment Stopped ceftriaxone yesterday due to abnormal infusion related effect to delayed onset will given cipro for another 1-2 months then cefadroxil for another 6-12 months or indefinitely We don't have cefazolin mic but we don't have high rate of cephalexin resistance in ecoli   Will call [facility] to remove picc  Start cipro today 500 po twice a day for 2 months   See me in 6 weeks  01/30/23 id clinic assessment Tolerating ciprofloxacin 500 mg po bid well; no  muscle ache/pain, n/v/rash/diarrhea; no joint pain Neck pain gone; no lower extremity weakness. Patient very happy Will plan another month of cipro that would give a good 3 months of "high concentrating drug" at the surgical site and transition then to suppressive abx with cefadroxil there after  Labs today      Follow-up: Return in about 5 weeks (around 03/06/2023).      Raymondo Band, MD [facility] 01/30/2023, 8:57 AM

## 2023-01-31 LAB — COMPLETE METABOLIC PANEL WITH GFR
AG Ratio: 1.4 (calc) (ref 1.0–2.5)
ALT: 21 U/L (ref 9–46)
AST: 17 U/L (ref 10–35)
Albumin: 4 g/dL (ref 3.6–5.1)
Alkaline phosphatase (APISO): 85 U/L (ref 35–144)
BUN: 21 mg/dL (ref 7–25)
CO2: 25 mmol/L (ref 20–32)
Calcium: 9.1 mg/dL (ref 8.6–10.3)
Chloride: 105 mmol/L (ref 98–110)
Creat: 0.83 mg/dL (ref 0.70–1.28)
Globulin: 2.8 g/dL (ref 1.9–3.7)
Glucose, Bld: 83 mg/dL (ref 65–99)
Potassium: 4.8 mmol/L (ref 3.5–5.3)
Sodium: 140 mmol/L (ref 135–146)
Total Bilirubin: 0.5 mg/dL (ref 0.2–1.2)
Total Protein: 6.8 g/dL (ref 6.1–8.1)
eGFR: 92 mL/min/{1.73_m2} (ref >=60)

## 2023-01-31 LAB — C-REACTIVE PROTEIN: CRP: <3 mg/L (ref <8.0)

## 2023-01-31 LAB — CBC WITH DIFFERENTIAL/PLATELET
Absolute Monocytes: 423 {cells}/uL (ref 200–950)
Basophils Absolute: 41 {cells}/uL (ref 0–200)
Basophils Relative: 0.9 %
Eosinophils Absolute: 672 {cells}/uL — ABNORMAL HIGH (ref 15–500)
Eosinophils Relative: 14.6 %
HCT: 42.5 % (ref 38.5–50.0)
Hemoglobin: 13.6 g/dL (ref 13.2–17.1)
Lymphs Abs: 1674 {cells}/uL (ref 850–3900)
MCH: 28.7 pg (ref 27.0–33.0)
MCHC: 32 g/dL (ref 32.0–36.0)
MCV: 89.7 fL (ref 80.0–100.0)
MPV: 9.8 fL (ref 7.5–12.5)
Monocytes Relative: 9.2 %
Neutro Abs: 1789 {cells}/uL (ref 1500–7800)
Neutrophils Relative %: 38.9 %
Platelets: 268 10*3/uL (ref 140–400)
RBC: 4.74 10*6/uL (ref 4.20–5.80)
RDW: 14.6 % (ref 11.0–15.0)
Total Lymphocyte: 36.4 %
WBC: 4.6 10*3/uL (ref 3.8–10.8)

## 2023-02-12 ENCOUNTER — Encounter: Payer: Self-pay | Admitting: Emergency Medicine

## 2023-02-24 ENCOUNTER — Telehealth: Payer: Self-pay

## 2023-02-24 DIAGNOSIS — T847XXD Infection and inflammatory reaction due to other internal orthopedic prosthetic devices, implants and grafts, subsequent encounter: Secondary | ICD-10-CM

## 2023-02-24 MED ORDER — CEFADROXIL 500 MG PO CAPS
1000.0000 mg | ORAL_CAPSULE | Freq: Two times a day (BID) | ORAL | 5 refills | Status: DC
Start: 2023-02-24 — End: 2023-05-12

## 2023-02-24 NOTE — Telephone Encounter (Addendum)
Spoke with Frederick Andrews and notified her that new antibiotic, cefadroxil, has been sent.   Ceftriaxone allergy reviewed by Dr. Renold Don and okay to continue with cefadroxil per MD.   Recommended patient and Frederick Andrews monitor for any signs of an allergic reaction such as hives, wheezing, shortness of breath, throat tightness, and to go to the emergency room if these develop. Frederick Andrews verbalized understanding.   Sandie Ano, RN

## 2023-02-24 NOTE — Addendum Note (Signed)
Addended by: Linna Hoff D on: 02/24/2023 01:17 PM   Modules accepted: Orders

## 2023-02-24 NOTE — Telephone Encounter (Signed)
Patient's daughter called requesting refill of ciprofloxacin be sent to [facility] on [address]. Will route to provider.   Sandie Ano, RN

## 2023-03-03 ENCOUNTER — Encounter: Payer: Self-pay | Admitting: Urology

## 2023-03-03 ENCOUNTER — Ambulatory Visit (INDEPENDENT_AMBULATORY_CARE_PROVIDER_SITE_OTHER): Payer: 59 | Admitting: Urology

## 2023-03-03 VITALS — BP 175/82 | HR 73

## 2023-03-03 DIAGNOSIS — Z87898 Personal history of other specified conditions: Secondary | ICD-10-CM

## 2023-03-03 DIAGNOSIS — N138 Other obstructive and reflux uropathy: Secondary | ICD-10-CM | POA: Diagnosis not present

## 2023-03-03 DIAGNOSIS — N401 Enlarged prostate with lower urinary tract symptoms: Secondary | ICD-10-CM | POA: Diagnosis not present

## 2023-03-03 DIAGNOSIS — R339 Retention of urine, unspecified: Secondary | ICD-10-CM

## 2023-03-03 LAB — URINALYSIS, ROUTINE W REFLEX MICROSCOPIC
Bilirubin, UA: NEGATIVE
Glucose, UA: NEGATIVE
Ketones, UA: NEGATIVE
Leukocytes,UA: NEGATIVE
Nitrite, UA: NEGATIVE
Protein,UA: NEGATIVE
RBC, UA: NEGATIVE
Specific Gravity, UA: 1.015 (ref 1.005–1.030)
Urobilinogen, Ur: 0.2 mg/dL (ref 0.2–1.0)
pH, UA: 6.5 (ref 5.0–7.5)

## 2023-03-03 LAB — BLADDER SCAN AMB NON-IMAGING

## 2023-03-03 MED ORDER — TAMSULOSIN HCL 0.4 MG PO CAPS
0.4000 mg | ORAL_CAPSULE | Freq: Two times a day (BID) | ORAL | 11 refills | Status: DC
Start: 2023-03-03 — End: 2024-01-21

## 2023-03-03 NOTE — Progress Notes (Signed)
Assessment: 1. BPH with obstruction/lower urinary tract symptoms   2. History of urinary retention     Plan: Continue tamsulosin BID PSA today Return to office in 6 months  Chief Complaint:  Chief Complaint  Patient presents with   Urinary Retention    History of Present Illness:  Frederick Andrews is a 72 y.o. male who is seen for further evaluation of urinary retention. 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  He initially voided postop but then developed urinary retention.  Bladder scan showed a volume of 970 mL.  He underwent I&O catheterization with initial volume of 750 mL.  He was started on tamsulosin.  A Foley catheter was placed and he was discharged with the Foley catheter. No prior history of urinary retention.  He did notice some gradual worsening of his symptoms associated with his neck pain prior to his hospitalization.  CT abdomen and pelvis with contrast from 11/23/2022 showed normal kidneys without mass or obstruction, mild bladder distention, and normal-appearing prostate. His foley was removed on 12/05/22 following a voiding trial in the office.  He was able to void 1-2 times following foley removal.  He presented to ED later on 12/05/22 with urinary retention.  Foley placed with return of 1200 ml. His tamsulosin was increased to BID. His foley was removed on 12/12/22 after a successful voiding trial in the office.   He was seen in the ER on 12/12/22 with retention and a foley was replaced.  His daughter reports that he was having problems with constipation which has since resolved.  He continued on tamsulosin twice daily.  He was on Cipro twice daily per infectious disease. His Foley was removed after a successful voiding trial on 12/23/2022.  He was instructed on clean intermittent catheterization and supplies were given for use on a as needed basis. At his visit in 9/24, he had been voiding spontaneously and had not  required intermittent catheterization.  He continued on tamsulosin.  He was voiding with a good stream.  No dysuria or gross hematuria. IPSS = 2  He returns today for follow-up.  He continues on tamsulosin.  He is not having any lower urinary tract symptoms at this time.  No dysuria or gross hematuria. IPSS = 0.  The patient's daughter interpreted during the visit today.  Portions of the above documentation were copied from a prior visit for review purposes only.   Past Medical History:  Past Medical History:  Diagnosis Date   Hyperlipemia    Hypertension     Past Surgical History:  Past Surgical History:  Procedure Laterality Date   ANTERIOR CERVICAL DECOMPRESSION FOR EPIDURAL ABSCESS N/A 11/25/2022   Procedure: ANTERIOR CERVICAL DECOMPRESSION FUSION FOR EPIDURAL ABSCESS CERVICAL FOUR-FIVE AND CERVICAL FIVE-SIX;  Surgeon: Barnett Abu, MD;  Location: [facility] OR;  Service: Neurosurgery;  Laterality: N/A;    Allergies:  Allergies  Allergen Reactions   Rocephin [Ceftriaxone] Shortness Of Breath    Family History:  Family History  Problem Relation Age of Onset   Heart disease Mother    Hypertension Mother    Hyperlipidemia Mother    Hyperlipidemia Other     Social History:  Social History   Tobacco Use   Smoking status: Every Day    Current packs/day: 1.00    Average packs/day: 1 pack/day for 40.0 years (40.0 ttl pk-yrs)    Types: Cigarettes   Tobacco comments:    Working on decreasing the number of  cigarettes per day.  Vaping Use   Vaping status: Never Used  Substance Use Topics   Alcohol use: No    Alcohol/week: 0.0 standard drinks of alcohol   Drug use: No    ROS: Constitutional:  Negative for fever, chills, weight loss CV: Negative for chest pain, previous MI, hypertension Respiratory:  Negative for shortness of breath, wheezing, sleep apnea, frequent cough GI:  Negative for nausea, vomiting, bloody stool, GERD   Physical exam: BP (!) 175/82   Pulse 73   GENERAL APPEARANCE:  Well appearing, well developed, well nourished, NAD HEENT:  Atraumatic, normocephalic, oropharynx clear NECK:  Supple without lymphadenopathy or thyromegaly ABDOMEN:  Soft, non-tender, no masses EXTREMITIES:  Moves all extremities well, without clubbing, cyanosis, or edema NEUROLOGIC:  Alert and oriented x 3, normal gait, CN II-XII grossly intact MENTAL STATUS:  appropriate BACK:  Non-tender to palpation, No CVAT SKIN:  Warm, dry, and intact  Results: U/A: negative  PVR = 27 ml

## 2023-03-04 ENCOUNTER — Encounter: Payer: Self-pay | Admitting: Urology

## 2023-03-04 LAB — PSA: Prostate Specific Ag, Serum: 1.2 ng/mL (ref 0.0–4.0)

## 2023-03-06 ENCOUNTER — Encounter: Payer: Self-pay | Admitting: Internal Medicine

## 2023-03-06 ENCOUNTER — Ambulatory Visit: Payer: 59 | Admitting: Internal Medicine

## 2023-03-06 ENCOUNTER — Other Ambulatory Visit: Payer: Self-pay

## 2023-03-06 VITALS — BP 166/75 | HR 76 | Temp 98.1°F | Resp 16 | Wt 124.2 lb

## 2023-03-06 DIAGNOSIS — T847XXD Infection and inflammatory reaction due to other internal orthopedic prosthetic devices, implants and grafts, subsequent encounter: Secondary | ICD-10-CM

## 2023-03-06 NOTE — Progress Notes (Signed)
[facility]  Patient Active Problem List   Diagnosis Date Noted   BPH with obstruction/lower urinary tract symptoms 03/03/2023   Epidural abscess 11/25/2022   Bacteremia due to Escherichia coli 11/23/2022   Coronary artery calcification 11/23/2022   Aortic atherosclerosis (HCC) 11/23/2022   Normocytic anemia 11/23/2022   Bilateral chronic knee pain 04/27/2021   Hyperlipidemia 05/30/2016   Essential hypertension 05/30/2016   Chronic lung disease 05/30/2016   Tobacco use 05/30/2016      Subjective:    Patient ID: Frederick Andrews, male    DOB: [birth year], 72 y.o.   MRN: [id number]  Chief Complaint  Patient presents with   Follow-up    Hardware complicating wound infection, subsequent encounter      HPI:  Frederick Andrews is a 72 y.o. male here for hospital f/u ecoli bsi/cervical om and s/p cspine hardware stabilization   12/19/22 id clinic f/u Patient developed 4 days prior to this visit immediately after ceftriaxone infusion --> legs/feet go cold, dyspnea/chest tightness, tingling in the whole body. Sx lasts an hour Today no infusion yet and and no sx See a&P  03/06/23 clinic f/u See a&p for detail. Switched to cipro during 8/22 due to adverse effect to ceftriaxone  Allergies  Allergen Reactions   Rocephin [Ceftriaxone] Shortness Of Breath      Outpatient Medications Prior to Visit  Medication Sig Dispense Refill   albuterol (VENTOLIN HFA) 108 (90 Base) MCG/ACT inhaler Inhale 2 puffs into the lungs as needed for wheezing.     atorvastatin (LIPITOR) 40 MG tablet Take 1 tablet (40 mg total) by mouth daily. 90 tablet 1   cefadroxil (DURICEF) 500 MG capsule Take 2 capsules (1,000 mg total) by mouth 2 (two) times daily. 120 capsule 5   fluticasone-salmeterol (ADVAIR) 250-50 MCG/ACT AEPB USE 1 INHALATION BY MOUTH TWICE DAILY     losartan (COZAAR) 50 MG tablet Take 50 mg by mouth daily.     tamsulosin (FLOMAX) 0.4 MG CAPS capsule Take 1 capsule  (0.4 mg total) by mouth 2 (two) times daily. 60 capsule 11   No facility-administered medications prior to visit.     Social History   Socioeconomic History   Marital status: Married    Spouse name: Not on file   Number of children: Not on file   Years of education: Not on file   Highest education level: Not on file  Occupational History   Not on file  Tobacco Use   Smoking status: Every Day    Current packs/day: 1.00    Average packs/day: 1 pack/day for 40.0 years (40.0 ttl pk-yrs)    Types: Cigarettes   Smokeless tobacco: Not on file   Tobacco comments:    Working on decreasing the number of cigarettes per day.  Vaping Use   Vaping status: Never Used  Substance and Sexual Activity   Alcohol use: No    Alcohol/week: 0.0 standard drinks of alcohol   Drug use: No   Sexual activity: Not on file  Other Topics Concern   Not on file  Social History Narrative   Not on file   Social Determinants of Health   Financial Resource Strain: Low Risk  (11/20/2022)   Received from [facility]   Overall Financial Resource Strain (CARDIA)    Difficulty of Paying Living Expenses: Not hard at all  Food Insecurity: No Food Insecurity (11/25/2022)   Hunger Vital Sign    Worried About Running Out  of Food in the Last Year: Never true    Ran Out of Food in the Last Year: Never true  Transportation Needs: No Transportation Needs (11/25/2022)   PRAPARE - Administrator, Civil Service (Medical): No    Lack of Transportation (Non-Medical): No  Physical Activity: Insufficiently Active (06/06/2022)   Received from [facility]   Exercise Vital Sign    Days of Exercise per Week: 2 days    Minutes of Exercise per Session: 10 min  Stress: No Stress Concern Present (06/06/2022)   Received from [facility] - Occupational Stress Questionnaire    Feeling of Stress : Only a little  Social Connections: Socially Integrated (06/06/2022)   Received  from [facility]   Social Network    How would you rate your social network (family, work, friends)?: Good participation with social networks  Intimate Partner Violence: Not At Risk (11/25/2022)   Humiliation, Afraid, Rape, and Kick questionnaire    Fear of Current or Ex-Partner: No    Emotionally Abused: No    Physically Abused: No    Sexually Abused: No      Review of Systems    All other ros negative   Objective:    BP (!) 180/87   Pulse 76   Temp 98.1 F (36.7 C)   Resp 16   Wt 124 lb 3.2 oz (56.3 kg)   SpO2 96%   BMI 22.72 kg/m  Nursing note and vital signs reviewed.  Physical Exam      General/constitutional: no distress, pleasant HEENT: Normocephalic, PER, Conj Clear, EOMI, Oropharynx clear Neck supple CV: rrr no mrg Lungs: clear to auscultation, normal respiratory effort Abd: Soft, Nontender Ext: no edema Skin: No Rash Neuro: nonfocal MSK: no peripheral joint swelling/tenderness/warmth; back spines nontender       Labs: Reviewed opat labs 8/20 cbc 3.8/11.1/283; cr 0.77; lft 36/46/184  Crp 4 <-- 17 @ 8/12 <-- 27 @ 12/02/22   Micro:  Serology:  Imaging: Reviewed    11/24/22 mri cspine Narrative & Impression  CLINICAL DATA:  Cervical radiculopathy. Infection suspected. Severe neck and shoulder pain. Bacteremia.   EXAM: MRI CERVICAL SPINE WITHOUT AND WITH CONTRAST   TECHNIQUE: Multiplanar and multiecho pulse sequences of the cervical spine, to include the craniocervical junction and cervicothoracic junction, were obtained without and with intravenous contrast.   CONTRAST:  5mL GADAVIST GADOBUTROL 1 MMOL/ML IV SOLN   COMPARISON:  CT of the cervical spine 11/22/2022   FINDINGS: Alignment: 3 mm anterolisthesis is present at C3-4. 2 mm anterolisthesis is present at C4-5. Straightening of the normal cervical lordosis is present.   Vertebrae: Signal and enhancement is present diffusely in the C4 and C5 vertebral bodies. Abnormal  enhancement extends into the posterior elements both levels.   Cord: Cord is compressed the C4-5 level less than 6 mm. Increased T2 signal is present within the cord at C4 and C5.   Extensive dural enhancement extends C2 through T1-2 posteriorly. This is most prominent from C2-3 through C7. A right ventral epidural abscess measures 6 x 2 x 12 mm posterior to the C5 vertebral body. A left ventral epidural abscess measures 4 x 2 x 10 mm posterior to the C5 vertebral body. Cord size and signal is normal above the C3 vertebral level and below the C6 vertebral level.   Posterior Fossa, vertebral arteries, paraspinal tissues: Prevertebral edema and enhancement extends from the C1-2 level through C6. No abnormal  fluid or enhancement extends into the mediastinum. The craniocervical junction is normal. Vertebral arteries are patent bilaterally.   Disc levels:   C2-3: Abnormal dural enhancement is present without focal stenosis.   C3-4: More prominent circumferential dural enhancement is present. Uncovertebral spurring contributes to mild right foraminal stenosis.   C4-5: Extensive epidural enhancement compresses the spinal cord to less than 6 mm. Enhancing soft tissue extends into the foramina bilaterally. Severe left foraminal stenosis is secondary to uncovertebral and facet hypertrophy.   C5-6: Circumferential epidural enhancement is present. Moderate foraminal stenosis is present bilaterally.   C6-7: Abnormal dural and epidural enhancement is present without significant central canal stenosis. Abnormal enhancement extends into the foramina.   C7-T1: Mild posterior dural enhancement is present. No focal disc disease or stenosis is present.   IMPRESSION: 1. 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 2. Extensive dural enhancement extends from the C1-2 level through T1-2  posteriorly. This is most prominent from C2-3 through C7. 3. Increased T2 signal is present within the cervical spinal cord at C4 and C5 consistent with spinal cord edema. 4. Prevertebral edema and enhancement extends from the C1-2 level through C6. No extension into the mediastinum. 5. Severe left foraminal stenosis at C4-5. 6. Moderate foraminal stenosis bilaterally at C5-6. 7. Mild right foraminal stenosis at C3-4. 8. Mild posterior dural enhancement at C7-T1 without significant central canal stenosis. 9. Abnormal enhancement extends into the foramina bilaterally at C4-5, C5-6, and C6-7.     Assessment & Plan:   Problem List Items Addressed This Visit   None Visit Diagnoses     Hardware complicating wound infection, subsequent encounter    -  Primary   Relevant Orders   CBC w/Diff   Comp Met (CMET)   C-reactive protein          No orders of the defined types were placed in this encounter.    Abx: 10/29-c cefadroxil 1000 mg po bid  8/22-10/29 cipro  7/27-8/21 ceftriaxone   7/27-29 vanc 7/30 cefazolin periop    72 year old male who emigrated from [place] in 1990s presented with shoulder pain radiating to his neck and a fall admitted 11/22/22 with:    Ecoli bacteremia -- unclear source  MRI cervical showed 6X2X 12 mm and 4X26X 10 mm epidural abscess C4/C5, extensive dural and enhancement C1-2 through T1-2, prevertebral edema C1-C6  Cervical spine om -- s/p acd c4-5 and c5-6 arthrodesis with structural fixation and allograft anterior plate fixation H0-8 on 7/30   7/26 bcx and 7/30 operative cx ecoli (S cipro, amp-sulb; no cefazolin card to reflect new mic cut off </=2; R amp, bactrim)    Repeat bcx 7/28 and 7/30 ngtd     7/27 tte with moderate ar but suspect age related valve issue rather than endocarditis. This is a very quick onset of sx and very low risk for IE from gram negative so I do not feel IE w/u warranted with tee. However, I am not opposed if  primary team want to pursue and will defer to them     Shoulders imaging reviewed -- clinically not consistent with septic arthritis   ------------------------ 12/19/22 id assessment Stopped ceftriaxone yesterday due to abnormal infusion related effect to delayed onset will given cipro for another 1-2 months then cefadroxil for another 6-12 months or indefinitely We don't have cefazolin mic but we  don't have high rate of cephalexin resistance in ecoli   Will call [facility] to remove picc  Start cipro today 500 po twice a day for 2 months   See me in 6 weeks  01/30/23 id clinic assessment Tolerating ciprofloxacin 500 mg po bid well; no muscle ache/pain, n/v/rash/diarrhea; no joint pain Neck pain gone; no lower extremity weakness. Patient very happy Will plan another month of cipro that would give a good 3 months of "high concentrating drug" at the surgical site and transition then to suppressive abx with cefadroxil there after  Labs today   ------------------------- 03/06/23 id assessment Reviewed previous labs Lab Results  Component Value Date   CRP <3.0 01/30/2023   Patient started on cefadroxil a week ago Mild pain when rigorous activity but feels well otherwise  No n/v/diarrhea/rash No f/c  Discussed slight uncertainty of cefadroxil as we can't test susceptibility for now If crp rising or increasing neck pain will switch to amox-clav  F/u 4 weeks   Follow-up: Return in about 4 weeks (around 04/03/2023).      Raymondo Band, MD [facility] 03/06/2023, 9:07 AM

## 2023-03-06 NOTE — Patient Instructions (Signed)
Continue cefadroxil 1000 mg twice a day   See me in 4 weeks to repeat labs   If increasing neck pain, fever, chill, or worsening labs, will change antibiotics to augmentin  Duration of treatment likely at least 6-12 months and potentially longer

## 2023-03-07 LAB — CBC WITH DIFFERENTIAL/PLATELET
Absolute Lymphocytes: 1826 {cells}/uL (ref 850–3900)
Absolute Monocytes: 372 {cells}/uL (ref 200–950)
Basophils Absolute: 31 {cells}/uL (ref 0–200)
Basophils Relative: 0.6 %
Eosinophils Absolute: 668 {cells}/uL — ABNORMAL HIGH (ref 15–500)
Eosinophils Relative: 13.1 %
HCT: 43.5 % (ref 38.5–50.0)
Hemoglobin: 14 g/dL (ref 13.2–17.1)
MCH: 28.5 pg (ref 27.0–33.0)
MCHC: 32.2 g/dL (ref 32.0–36.0)
MCV: 88.6 fL (ref 80.0–100.0)
MPV: 10.4 fL (ref 7.5–12.5)
Monocytes Relative: 7.3 %
Neutro Abs: 2203 {cells}/uL (ref 1500–7800)
Neutrophils Relative %: 43.2 %
Platelets: 225 10*3/uL (ref 140–400)
RBC: 4.91 10*6/uL (ref 4.20–5.80)
RDW: 13.7 % (ref 11.0–15.0)
Total Lymphocyte: 35.8 %
WBC: 5.1 10*3/uL (ref 3.8–10.8)

## 2023-03-07 LAB — COMPREHENSIVE METABOLIC PANEL
AG Ratio: 1.9 (calc) (ref 1.0–2.5)
ALT: 20 U/L (ref 9–46)
AST: 17 U/L (ref 10–35)
Albumin: 4.4 g/dL (ref 3.6–5.1)
Alkaline phosphatase (APISO): 84 U/L (ref 35–144)
BUN: 24 mg/dL (ref 7–25)
CO2: 22 mmol/L (ref 20–32)
Calcium: 9.3 mg/dL (ref 8.6–10.3)
Chloride: 107 mmol/L (ref 98–110)
Creat: 0.86 mg/dL (ref 0.70–1.28)
Globulin: 2.3 g/dL (ref 1.9–3.7)
Glucose, Bld: 79 mg/dL (ref 65–99)
Potassium: 4.6 mmol/L (ref 3.5–5.3)
Sodium: 140 mmol/L (ref 135–146)
Total Bilirubin: 0.4 mg/dL (ref 0.2–1.2)
Total Protein: 6.7 g/dL (ref 6.1–8.1)

## 2023-03-07 LAB — C-REACTIVE PROTEIN: CRP: <3 mg/L (ref <8.0)

## 2023-03-31 ENCOUNTER — Encounter: Payer: Self-pay | Admitting: Internal Medicine

## 2023-03-31 ENCOUNTER — Other Ambulatory Visit: Payer: Self-pay

## 2023-03-31 ENCOUNTER — Ambulatory Visit (INDEPENDENT_AMBULATORY_CARE_PROVIDER_SITE_OTHER): Payer: 59 | Admitting: Internal Medicine

## 2023-03-31 VITALS — BP 168/78 | HR 68 | Resp 16 | Ht 62.0 in | Wt 127.4 lb

## 2023-03-31 DIAGNOSIS — T847XXD Infection and inflammatory reaction due to other internal orthopedic prosthetic devices, implants and grafts, subsequent encounter: Secondary | ICD-10-CM

## 2023-03-31 NOTE — Patient Instructions (Signed)
We'll see you in 6 months  Labs today   Maybe we can take you off antibiotics summer 2025

## 2023-03-31 NOTE — Progress Notes (Signed)
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   MRN: [id number]  Chief Complaint  Patient presents with   Follow-up    HPI:  Frederick Andrews is a 72 y.o. male here for hospital f/u ecoli bsi/cervical om and s/p cspine hardware stabilization   12/19/22 id clinic f/u Patient developed 4 days prior to this visit immediately after ceftriaxone infusion --> legs/feet go cold, dyspnea/chest tightness, tingling in the whole body. Sx lasts an hour Today no infusion yet and and no sx See a&P  03/31/23 id clinic f/u See a&p for detail. Switched to cipro during 8/22 due to adverse effect to ceftriaxone. Finished 3 months cipro and switched to cefadroxil 02/2023 a month ago No complaint at all today Here with his daughter   Allergies  Allergen Reactions   Rocephin [Ceftriaxone] Shortness Of Breath      Outpatient Medications Prior to Visit  Medication Sig Dispense Refill   albuterol (VENTOLIN HFA) 108 (90 Base) MCG/ACT inhaler Inhale 2 puffs into the lungs as needed for wheezing.     atorvastatin (LIPITOR) 40 MG tablet Take 1 tablet (40 mg total) by mouth daily. 90 tablet 1   cefadroxil (DURICEF) 500 MG capsule Take 2 capsules (1,000 mg total) by mouth 2 (two) times daily. 120 capsule 5   fluticasone-salmeterol (ADVAIR) 250-50 MCG/ACT AEPB USE 1 INHALATION BY MOUTH TWICE DAILY     losartan (COZAAR) 50 MG tablet Take 50 mg by mouth daily.      tamsulosin (FLOMAX) 0.4 MG CAPS capsule Take 1 capsule (0.4 mg total) by mouth 2 (two) times daily. 60 capsule 11   No facility-administered medications prior to visit.     Social History   Socioeconomic History   Marital status: Married    Spouse name: Not on file   Number of children: Not on file   Years of education: Not on file   Highest education level: Not on file  Occupational History   Not on file  Tobacco Use   Smoking status: Every Day    Current packs/day: 1.00    Average packs/day: 1 pack/day for 40.0 years (40.0 ttl pk-yrs)    Types: Cigarettes   Smokeless tobacco: Not on file   Tobacco comments:    Working on decreasing the number of cigarettes per day.  Vaping Use   Vaping status: Never Used  Substance and Sexual Activity   Alcohol use: No    Alcohol/week: 0.0 standard drinks of alcohol   Drug use: No   Sexual activity: Not on file  Other Topics Concern   Not on file  Social History Narrative   Not on file   Social Determinants of Health   Financial Resource Strain: Low Risk  (11/20/2022)   Received from [facility]   Overall Financial Resource Strain (CARDIA)    Difficulty of Paying Living Expenses: Not hard at all  Food Insecurity: No Food Insecurity (11/25/2022)  Hunger Vital Sign    Worried About Running Out of Food in the Last Year: Never true    Ran Out of Food in the Last Year: Never true  Transportation Needs: No Transportation Needs (11/25/2022)   PRAPARE - Administrator, Civil Service (Medical): No    Lack of Transportation (Non-Medical): No  Physical Activity: Insufficiently Active (06/06/2022)   Received from [facility]   Exercise Vital Sign    Days of Exercise per Week: 2 days    Minutes of Exercise per Session: 10 min  Stress: No Stress Concern Present (06/06/2022)   Received from [facility] - Occupational Stress Questionnaire    Feeling of Stress : Only a little   Social Connections: Socially Integrated (06/06/2022)   Received from [facility]   Social Network    How would you rate your social network (family, work, friends)?: Good participation with social networks  Intimate Partner Violence: Not At Risk (11/25/2022)   Humiliation, Afraid, Rape, and Kick questionnaire    Fear of Current or Ex-Partner: No    Emotionally Abused: No    Physically Abused: No    Sexually Abused: No      Review of Systems    All other ros negative   Objective:    BP (!) 168/78   Pulse 68   Resp 16   Ht 5\' 2"  (1.575 m)   Wt 127 lb 6.4 oz (57.8 kg)   SpO2 98%   BMI 23.30 kg/m  Nursing note and vital signs reviewed.  Physical Exam      General/constitutional: no distress, pleasant HEENT: Normocephalic, PER, Conj Clear, EOMI, Oropharynx clear Neck supple CV: rrr no mrg Lungs: clear to auscultation, normal respiratory effort Abd: Soft, Nontender Ext: no edema Skin: No Rash Neuro: nonfocal MSK: no peripheral joint swelling/tenderness/warmth; back spines nontender       Labs: Lab Results  Component Value Date   WBC 5.1 03/06/2023   HGB 14.0 03/06/2023   HCT 43.5 03/06/2023   MCV 88.6 03/06/2023   PLT 225 03/06/2023   Last metabolic panel Lab Results  Component Value Date   GLUCOSE 79 03/06/2023   NA 140 03/06/2023   K 4.6 03/06/2023   CL 107 03/06/2023   CO2 22 03/06/2023   BUN 24 03/06/2023   CREATININE 0.86 03/06/2023   EGFR 92 01/30/2023   CALCIUM 9.3 03/06/2023   PHOS 3.0 11/28/2022   PROT 6.7 03/06/2023   ALBUMIN 2.1 (L) 11/28/2022   BILITOT 0.4 03/06/2023   ALKPHOS 238 (H) 11/25/2022   AST 17 03/06/2023   ALT 20 03/06/2023   ANIONGAP 10 11/28/2022      Component Value Date/Time   CRP <3.0 03/06/2023 0900   CRP <3.0 01/30/2023 0910   CRP 15.5 (H) 11/26/2022 0549     Previous opat labs 8/20 cbc 3.8/11.1/283; cr 0.77; lft 36/46/184  Crp 4 <-- 17 @ 8/12 <-- 27 @  12/02/22   Micro:  Serology:  Imaging: Reviewed    11/24/22 mri cspine Narrative & Impression  CLINICAL DATA:  Cervical radiculopathy. Infection suspected. Severe neck and shoulder pain. Bacteremia.   EXAM: MRI CERVICAL SPINE WITHOUT AND WITH CONTRAST   TECHNIQUE: Multiplanar and multiecho pulse sequences of the cervical spine, to include the craniocervical junction and cervicothoracic junction, were obtained without and with intravenous contrast.   CONTRAST:  5mL GADAVIST GADOBUTROL 1 MMOL/ML IV SOLN   COMPARISON:  CT of the cervical  spine 11/22/2022   FINDINGS: Alignment: 3 mm anterolisthesis is present at C3-4. 2 mm anterolisthesis is present at C4-5. Straightening of the normal cervical lordosis is present.   Vertebrae: Signal and enhancement is present diffusely in the C4 and C5 vertebral bodies. Abnormal enhancement extends into the posterior elements both levels.   Cord: Cord is compressed the C4-5 level less than 6 mm. Increased T2 signal is present within the cord at C4 and C5.   Extensive dural enhancement extends C2 through T1-2 posteriorly. This is most prominent from C2-3 through C7. A right ventral epidural abscess measures 6 x 2 x 12 mm posterior to the C5 vertebral body. A left ventral epidural abscess measures 4 x 2 x 10 mm posterior to the C5 vertebral body. Cord size and signal is normal above the C3 vertebral level and below the C6 vertebral level.   Posterior Fossa, vertebral arteries, paraspinal tissues: Prevertebral edema and enhancement extends from the C1-2 level through C6. No abnormal fluid or enhancement extends into the mediastinum. The craniocervical junction is normal. Vertebral arteries are patent bilaterally.   Disc levels:   C2-3: Abnormal dural enhancement is present without focal stenosis.   C3-4: More prominent circumferential dural enhancement is present. Uncovertebral spurring contributes to mild right foraminal  stenosis.   C4-5: Extensive epidural enhancement compresses the spinal cord to less than 6 mm. Enhancing soft tissue extends into the foramina bilaterally. Severe left foraminal stenosis is secondary to uncovertebral and facet hypertrophy.   C5-6: Circumferential epidural enhancement is present. Moderate foraminal stenosis is present bilaterally.   C6-7: Abnormal dural and epidural enhancement is present without significant central canal stenosis. Abnormal enhancement extends into the foramina.   C7-T1: Mild posterior dural enhancement is present. No focal disc disease or stenosis is present.   IMPRESSION: 1. 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 2. Extensive dural enhancement extends from the C1-2 level through T1-2 posteriorly. This is most prominent from C2-3 through C7. 3. Increased T2 signal is present within the cervical spinal cord at C4 and C5 consistent with spinal cord edema. 4. Prevertebral edema and enhancement extends from the C1-2 level through C6. No extension into the mediastinum. 5. Severe left foraminal stenosis at C4-5. 6. Moderate foraminal stenosis bilaterally at C5-6. 7. Mild right foraminal stenosis at C3-4. 8. Mild posterior dural enhancement at C7-T1 without significant central canal stenosis. 9. Abnormal enhancement extends into the foramina bilaterally at C4-5, C5-6, and C6-7.     Assessment & Plan:   Problem List Items Addressed This Visit   None Visit Diagnoses     Hardware complicating wound infection, subsequent encounter    -  Primary   Relevant Orders   C-reactive protein   CBC w/Diff   Comprehensive metabolic panel          No orders of the defined types were placed in this encounter.    Abx: 10/29-c cefadroxil 1000 mg po bid  8/22-10/29 cipro  7/27-8/21 ceftriaxone   7/27-29 vanc 7/30 cefazolin periop     72 year old male who emigrated from [place] in 1990s presented with shoulder pain radiating to his neck and a fall admitted 11/22/22 with:    Ecoli bacteremia -- unclear source  MRI cervical showed 6X2X 12 mm and 4X26X 10 mm epidural abscess C4/C5, extensive dural and enhancement C1-2 through T1-2, prevertebral edema C1-C6  Cervical spine om -- s/p acd c4-5 and c5-6 arthrodesis with structural fixation and allograft anterior plate fixation Q2-5 on 7/30   7/26 bcx and 7/30 operative cx ecoli (S cipro, amp-sulb; no cefazolin card to reflect new mic cut off </=2; R amp, bactrim)    Repeat bcx 7/28 and 7/30 ngtd     7/27 tte with moderate ar but suspect age related valve issue rather than endocarditis. This is a very quick onset of sx and very low risk for IE from gram negative so I do not feel IE w/u warranted with tee. However, I am not opposed if primary team want to pursue and will defer to them     Shoulders imaging reviewed -- clinically not consistent with septic arthritis   ------------------------ 12/19/22 id assessment Stopped ceftriaxone yesterday due to abnormal infusion related effect to delayed onset will given cipro for another 1-2 months then cefadroxil for another 6-12 months or indefinitely We don't have cefazolin mic but we don't have high rate of cephalexin resistance in ecoli   Will call [facility] to remove picc  Start cipro today 500 po twice a day for 2 months   See me in 6 weeks  01/30/23 id clinic assessment Tolerating ciprofloxacin 500 mg po bid well; no muscle ache/pain, n/v/rash/diarrhea; no joint pain Neck pain gone; no lower extremity weakness. Patient very happy Will plan another month of cipro that would give a good 3 months of "high concentrating drug" at the surgical site and transition then to suppressive abx with cefadroxil there after  Labs today   ------------------------- 03/06/23 id assessment Reviewed previous labs Lab Results  Component Value  Date   CRP <3.0 03/06/2023   Patient started on cefadroxil a week ago Mild pain when rigorous activity but feels well otherwise  No n/v/diarrhea/rash No f/c  Discussed slight uncertainty of cefadroxil as we can't test susceptibility for now If crp rising or increasing neck pain will switch to amox-clav  F/u 4 weeks   03/31/2023 Doing really well no pain at all. Full active rom Tolerating cefadroxil -- it appears either the infection is cured or that cefadroxil is working against the [place] Recent labs reviewed looking great with normal crp previously on cipro Discuss trialling antibiotics in about a year to ascertain cure and we'll discuss again summer 2025 Labs today F/u 6 months   Follow-up: Return in about 6 months (around 09/29/2023).      Raymondo Band, MD [facility] 03/31/2023, 8:55 AM

## 2023-04-01 LAB — CBC WITH DIFFERENTIAL/PLATELET
Absolute Lymphocytes: 1129 {cells}/uL (ref 850–3900)
Absolute Monocytes: 442 {cells}/uL (ref 200–950)
Basophils Absolute: 41 {cells}/uL (ref 0–200)
Basophils Relative: 0.6 %
Eosinophils Absolute: 741 {cells}/uL — ABNORMAL HIGH (ref 15–500)
Eosinophils Relative: 10.9 %
HCT: 45.3 % (ref 38.5–50.0)
Hemoglobin: 14.5 g/dL (ref 13.2–17.1)
MCH: 28.3 pg (ref 27.0–33.0)
MCHC: 32 g/dL (ref 32.0–36.0)
MCV: 88.3 fL (ref 80.0–100.0)
MPV: 10.3 fL (ref 7.5–12.5)
Monocytes Relative: 6.5 %
Neutro Abs: 4447 {cells}/uL (ref 1500–7800)
Neutrophils Relative %: 65.4 %
Platelets: 243 10*3/uL (ref 140–400)
RBC: 5.13 10*6/uL (ref 4.20–5.80)
RDW: 13 % (ref 11.0–15.0)
Total Lymphocyte: 16.6 %
WBC: 6.8 10*3/uL (ref 3.8–10.8)

## 2023-04-01 LAB — COMPREHENSIVE METABOLIC PANEL
AG Ratio: 1.5 (calc) (ref 1.0–2.5)
ALT: 25 U/L (ref 9–46)
AST: 17 U/L (ref 10–35)
Albumin: 4.3 g/dL (ref 3.6–5.1)
Alkaline phosphatase (APISO): 105 U/L (ref 35–144)
BUN: 24 mg/dL (ref 7–25)
CO2: 26 mmol/L (ref 20–32)
Calcium: 9.5 mg/dL (ref 8.6–10.3)
Chloride: 106 mmol/L (ref 98–110)
Creat: 0.98 mg/dL (ref 0.70–1.28)
Globulin: 2.9 g/dL (ref 1.9–3.7)
Glucose, Bld: 95 mg/dL (ref 65–99)
Potassium: 5.4 mmol/L — ABNORMAL HIGH (ref 3.5–5.3)
Sodium: 140 mmol/L (ref 135–146)
Total Bilirubin: 0.4 mg/dL (ref 0.2–1.2)
Total Protein: 7.2 g/dL (ref 6.1–8.1)

## 2023-04-01 LAB — C-REACTIVE PROTEIN: CRP: 5.9 mg/L (ref <8.0)

## 2023-04-23 NOTE — Progress Notes (Signed)
 Cardiology Office Note:  .   Date:  05/06/2023  ID:  Frederick Andrews, DOB 18-Aug-1950, MRN [id number] PCP: Valma Lannie DELENA DEVONNA  [facility] Providers Cardiologist:  Soyla DELENA Merck, MD    History of Present Illness: .   Frederick Andrews is a 72 y.o. male with past medical history of hypertension, hyperlipidemia, nicotine  dependence and paroxysmal atrial fibrillation.   Discussed the use of AI scribe software for clinical note transcription with the patient, who gave verbal consent to proceed.  History of Present Illness   The patient, with a history of sepsis, valvular heart disease, supraventricular tachycardia, discitis, osteomyelitis, and cervical spine surgery, presents for a follow-up visit post-hospitalization. During the hospital stay, the patient experienced two brief episodes of atrial fibrillation, which were managed with rate control as the patient recovered from sepsis. 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 This required emergent anterior cervical decompression of C4, C5 and C5, C6 arthrodesis with disc stable fixation and allograft anterior plate fixation by neurosurgery. The patient was treated with IV antibiotics and has been asymptomatic since the hospital stay. The patient also has a history of urinary retention, which is improving.       Studies Reviewed: SABRA   EKG Interpretation Date/Time:  Tuesday May 06 2023 09:03:31 EST Ventricular Rate:  67 PR Interval:  172 QRS Duration:  106 QT Interval:  402 QTC Calculation: 424 R Axis:   -48  Text Interpretation: Normal sinus rhythm Left axis deviation Confirmed by Merck Soyla ([id number]) on 05/06/2023 9:45:43 AM   Cardiac Studies & Procedures      ECHOCARDIOGRAM  ECHOCARDIOGRAM COMPLETE 11/23/2022  Narrative ECHOCARDIOGRAM REPORT    Patient Name:   [name]  Date of Exam: 11/23/2022 Medical Rec #:  [id number]   Height:       63.0 in Accession #:    [id number] Weight:       121.3 lb Date of Birth:  [birth year]  BSA:          1.563 m Patient Age:    71 years   BP:           157/69 mmHg Patient Gender: M          HR:           82 bpm. Exam Location:  Inpatient  Procedure: 2D Echo, Color Doppler and Cardiac Doppler  Indications:    Bacteremia  History:        Patient has no prior history of Echocardiogram examinations. Risk Factors:Hypertension and Dyslipidemia.  Sonographer:    Damien Senior RDCS Referring Phys: [phone] DAVID MANUEL ORTIZ   Sonographer Comments: Suboptimal apical window due to lung interference. IMPRESSIONS   1. Left ventricular ejection fraction, by estimation, is 55 to 60%. The left ventricle has normal function. The left ventricle has no regional wall motion abnormalities. Left ventricular diastolic parameters were normal. 2. Right ventricular systolic function is normal. The right ventricular size is normal. There is mildly elevated pulmonary artery systolic pressure. The estimated right ventricular systolic pressure is 38.7 mmHg. 3. Left atrial size was mildly dilated. 4. The mitral valve is normal in structure. Mild mitral valve regurgitation. No evidence of mitral stenosis. 5. The aortic valve is tricuspid. There is mild thickening of the aortic valve. Aortic valve regurgitation is moderate. No aortic stenosis is present. 6. 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 7. The inferior  vena cava is normal in size with <50% respiratory variability, suggesting right atrial pressure of 8 mmHg.  Comparison(s): Regurgitant valve lesions raise concern for endocarditis in setting of bacteremia, consider TEE if clinically indicated.  FINDINGS Left Ventricle: Left ventricular ejection fraction, by estimation, is 55 to 60%. The left ventricle has normal function. The left ventricle has no regional wall motion abnormalities. The left ventricular internal cavity size was normal in size.  There is borderline left ventricular hypertrophy. Left ventricular diastolic parameters were normal.  Right Ventricle: The right ventricular size is normal. No increase in right ventricular wall thickness. Right ventricular systolic function is normal. There is mildly elevated pulmonary artery systolic pressure. The tricuspid regurgitant velocity is 2.77 m/s, and with an assumed right atrial pressure of 8 mmHg, the estimated right ventricular systolic pressure is 38.7 mmHg.  Left Atrium: Left atrial size was mildly dilated.  Right Atrium: Right atrial size was normal in size.  Pericardium: There is no evidence of pericardial effusion.  Mitral Valve: The mitral valve is normal in structure. Mild mitral valve regurgitation. No evidence of mitral valve stenosis.  Tricuspid Valve: The tricuspid valve is normal in structure. Tricuspid valve regurgitation is mild . No evidence of tricuspid stenosis.  Aortic Valve: The aortic valve is tricuspid. There is mild thickening of the aortic valve. Aortic valve regurgitation is moderate. Aortic regurgitation PHT measures 416 msec. No aortic stenosis is present.  Pulmonic Valve: The pulmonic valve was not well visualized. 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 No evidence of pulmonic stenosis.  Aorta: The aortic root and ascending aorta are structurally normal, with no evidence of dilitation.  Venous: The inferior vena cava is normal in size with less than 50% respiratory variability, suggesting right atrial pressure of 8 mmHg.  IAS/Shunts: The atrial septum is grossly normal.   LEFT VENTRICLE PLAX 2D LVIDd:         4.90 cm   Diastology LVIDs:         3.20 cm   LV e' medial:    10.40 cm/s LV PW:         1.00 cm   LV E/e' medial:  8.0 LV IVS:        1.00 cm   LV e' lateral:   11.70 cm/s LVOT diam:     2.10 cm   LV E/e' lateral: 7.1 LV SV:         73 LV SV Index:   47 LVOT Area:     3.46 cm   RIGHT VENTRICLE RV S prime:     9.90 cm/s TAPSE  (M-mode): 2.1 cm  LEFT ATRIUM             Index        RIGHT ATRIUM           Index LA diam:        3.70 cm 2.37 cm/m   RA Area:     16.10 cm LA Vol (A2C):   57.8 ml 36.98 ml/m  RA Volume:   35.40 ml  22.65 ml/m LA Vol (A4C):   48.4 ml 30.96 ml/m LA Biplane Vol: 53.9 ml 34.48 ml/m AORTIC VALVE LVOT Vmax:   97.30 cm/s LVOT Vmean:  72.600 cm/s LVOT VTI:    0.212 m AI PHT:      416 msec  AORTA Ao Root diam: 3.60 cm Ao Asc diam:  3.60 cm  MITRAL VALVE  TRICUSPID VALVE MV Area (PHT): 3.42 cm    TR Peak grad:   30.7 mmHg MV Decel Time: 222 msec    TR Vmax:        277.00 cm/s MV E velocity: 83.60 cm/s MV A velocity: 78.00 cm/s  SHUNTS MV E/A ratio:  1.07        Systemic VTI:  0.21 m Systemic Diam: 2.10 cm  Soyla Merck MD Electronically signed by Soyla Merck MD Signature Date/Time: 11/23/2022/5:24:19 PM    Final   MONITORS  LONG TERM MONITOR-LIVE TELEMETRY (3-14 DAYS) 12/26/2022  Narrative Impressions: No atrial fibrillation   Patch Wear Time:  14 days and 0 hours (2024-08-04T11:16:40-0400 to 2024-08-18T11:16:40-0400)  Patient had a min HR of 35 bpm, max HR of 156 bpm, and avg HR of 85 bpm. Predominant underlying rhythm was Sinus Rhythm. First Degree AV Block was present, Slight PR interval changes were noted. 3 Supraventricular Tachycardia runs occurred, the run with the fastest interval lasting 6 beats with a max rate of 156 bpm (avg 135 bpm); the run with the fastest interval was also the longest. Second Degree AV Block-Mobitz I (Wenckebach) was present. Isolated SVEs were rare (<1.0%), SVE Couplets were rare (<1.0%), and SVE Triplets were rare (<1.0%). Isolated VEs were rare (<1.0%), and no VE Couplets or VE Triplets were present.           Risk Assessment/Calculations:    CHA2DS2-VASc Score = 3   This indicates a 3.2% annual risk of stroke. The patient's score is based upon: CHF History: 0 HTN History: 1 Diabetes History: 0 Stroke History:  0 Vascular Disease History: 1 Age Score: 1 Gender Score: 0    HYPERTENSION CONTROL Vitals:   05/06/23 0854 05/06/23 1008  BP: (!) 180/80 (!) 164/72    The patient's blood pressure is elevated above target today.  In order to address the patient's elevated BP: The blood pressure is usually elevated in clinic.  Blood pressures monitored at home have been optimal.; Blood pressure will be monitored at home to determine if medication changes need to be made.; Follow up with general cardiology has been recommended.          Physical Exam:   VS:  BP (!) 164/72 (BP Location: Right Arm, Patient Position: Sitting, Cuff Size: Normal)   Pulse 74   Ht 5' 4 (1.626 m)   Wt 128 lb 12.8 oz (58.4 kg)   SpO2 96%   BMI 22.11 kg/m    Wt Readings from Last 3 Encounters:  05/06/23 128 lb 12.8 oz (58.4 kg)  03/31/23 127 lb 6.4 oz (57.8 kg)  03/06/23 124 lb 3.2 oz (56.3 kg)   Physical Exam   CARDIOVASCULAR: faint diastolic murmurs      GEN: Well nourished, well developed in no acute distress NECK: No JVD; No carotid bruits CARDIAC: RRR RESPIRATORY:  Clear to auscultation without rales, wheezing or rhonchi  ABDOMEN: Soft, non-tender, non-distended EXTREMITIES:  No edema; No deformity   ASSESSMENT AND PLAN: .    Assessment & Plan Paroxysmal atrial fibrillation (HCC)  Coronary artery calcification  Essential hypertension  Aortic atherosclerosis (HCC)  Hyperlipidemia, unspecified hyperlipidemia type  Pulmonary valve insufficiency, unspecified etiology  Aortic valve insufficiency, etiology of cardiac valve disease unspecified  Epidural abscess  Bacteremia due to Escherichia coli   Assessment and Plan    Valvular Heart Disease Moderate aortic valve regurgitation and severe pulmonary valve regurgitation. Normal biventricular function on echocardiogram, suggesting no hemodynamic sequela of valvular heart disease at  this time. Asymptomatic. -Schedule follow-up echocardiogram in  March 2025. f/u after.  Atrial Fibrillation Two brief episodes during hospitalization for sepsis, likely driven by the infection. No recurrence of atrial fibrillation on outpatient cardiac monitor post-hospitalization. Brief episodes of SVT and second-degree AV block Mobitz 1 noted. No current anticoagulation due to recent neurosurgery and lack of recurrent atrial fibrillation. -Continue monitoring for symptoms of palpitations or irregular rhythms. -If recurrent atrial fibrillation or flutter is noted, consider anticoagulation.  Sepsis Recovered from severe sepsis. 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 -Continue current treatment as directed by infectious disease specialist.  Hypertension Elevated blood pressure noted during visit, but reported to be normal at home. Currently on unspecified antihypertensive medication. -Recheck blood pressure at next visit. Consider medication adjustment if consistently high readings.   Hyperlipidemia Atorvastatin  increased to 40mg  daily due to coronary calcifications noted on CT scan. -Continue Atorvastatin  40mg  daily.  Follow-up -Schedule next visit for 2 months after echocardiogram to discuss results and check blood pressure. -Continue to coordinate care with infectious disease specialist and neurosurgery as needed.            Signed, Soyla DELENA Merck, MD

## 2023-05-06 ENCOUNTER — Ambulatory Visit: Payer: 59 | Attending: Internal Medicine | Admitting: Internal Medicine

## 2023-05-06 VITALS — BP 164/72 | HR 74 | Ht 64.0 in | Wt 128.8 lb

## 2023-05-06 DIAGNOSIS — I351 Nonrheumatic aortic (valve) insufficiency: Secondary | ICD-10-CM

## 2023-05-06 DIAGNOSIS — R7881 Bacteremia: Secondary | ICD-10-CM

## 2023-05-06 DIAGNOSIS — I10 Essential (primary) hypertension: Secondary | ICD-10-CM | POA: Diagnosis not present

## 2023-05-06 DIAGNOSIS — B9620 Unspecified Escherichia coli [E. coli] as the cause of diseases classified elsewhere: Secondary | ICD-10-CM

## 2023-05-06 DIAGNOSIS — I480 Paroxysmal atrial fibrillation: Secondary | ICD-10-CM

## 2023-05-06 DIAGNOSIS — E785 Hyperlipidemia, unspecified: Secondary | ICD-10-CM

## 2023-05-06 DIAGNOSIS — I2510 Atherosclerotic heart disease of native coronary artery without angina pectoris: Secondary | ICD-10-CM | POA: Diagnosis not present

## 2023-05-06 DIAGNOSIS — I700 Atherosclerosis of aorta: Secondary | ICD-10-CM | POA: Diagnosis not present

## 2023-05-06 DIAGNOSIS — G062 Extradural and subdural abscess, unspecified: Secondary | ICD-10-CM

## 2023-05-06 DIAGNOSIS — I371 Nonrheumatic pulmonary valve insufficiency: Secondary | ICD-10-CM

## 2023-05-06 NOTE — Patient Instructions (Signed)
 Medication Instructions:  Your physician recommends that you continue on your current medications as directed. Please refer to the Current Medication list given to you today.  *If you need a refill on your cardiac medications before your next appointment, please call your pharmacy*  Lab Work: None  Follow-Up: At [facility], you and your health needs are our priority.  As part of our continuing mission to provide you with exceptional heart care, we have created designated Provider Care Teams.  These Care Teams include your primary Cardiologist (physician) and Advanced Practice Providers (APPs -  Physician Assistants and Nurse Practitioners) who all work together to provide you with the care you need, when you need it.  Your next appointment:    07/15/2023 at 8:20 am  Provider:   Gayatri A Acharya, MD

## 2023-05-11 ENCOUNTER — Encounter: Payer: Self-pay | Admitting: Internal Medicine

## 2023-05-12 MED ORDER — CIPROFLOXACIN HCL 500 MG PO TABS: 500.0000 mg | ORAL_TABLET | Freq: Two times a day (BID) | ORAL | 2 refills | Status: DC

## 2023-05-12 MED ORDER — CIPROFLOXACIN HCL 500 MG PO TABS: 500.0000 mg | ORAL_TABLET | Freq: Two times a day (BID) | ORAL | 0 refills | Status: DC

## 2023-05-12 NOTE — Telephone Encounter (Signed)
 Let's stop the cefadroxil  (I have updated epic chart allergy list)  We'll have to restart cipro  then as it is the only oral option now  3 more months (cipro  500 bid) 1 month 2 refill supply given   Please see me in 1 week if not better with rash, otherwise 4 weeks  thanks

## 2023-05-22 ENCOUNTER — Other Ambulatory Visit: Payer: Self-pay | Admitting: Internal Medicine

## 2023-05-22 MED ORDER — CIPROFLOXACIN HCL 500 MG PO TABS: 500.0000 mg | ORAL_TABLET | Freq: Two times a day (BID) | ORAL | 2 refills | Status: AC

## 2023-05-22 NOTE — Telephone Encounter (Signed)
Called [facility] regarding pt cipro rx. Spoke with pharmacist who states they have not received refill. Was able to accept verbal.  Juanita Laster, RMA

## 2023-06-10 ENCOUNTER — Encounter: Payer: Self-pay | Admitting: Internal Medicine

## 2023-06-10 ENCOUNTER — Ambulatory Visit (INDEPENDENT_AMBULATORY_CARE_PROVIDER_SITE_OTHER): Payer: 59 | Admitting: Internal Medicine

## 2023-06-10 ENCOUNTER — Other Ambulatory Visit: Payer: Self-pay

## 2023-06-10 VITALS — BP 166/72 | HR 63 | Temp 97.4°F | Wt 128.0 lb

## 2023-06-10 DIAGNOSIS — M4642 Discitis, unspecified, cervical region: Secondary | ICD-10-CM

## 2023-06-10 DIAGNOSIS — G061 Intraspinal abscess and granuloma: Secondary | ICD-10-CM | POA: Diagnosis not present

## 2023-06-10 DIAGNOSIS — R7881 Bacteremia: Secondary | ICD-10-CM

## 2023-06-10 DIAGNOSIS — B9620 Unspecified Escherichia coli [E. coli] as the cause of diseases classified elsewhere: Secondary | ICD-10-CM | POA: Diagnosis not present

## 2023-06-10 DIAGNOSIS — T847XXD Infection and inflammatory reaction due to other internal orthopedic prosthetic devices, implants and grafts, subsequent encounter: Secondary | ICD-10-CM

## 2023-06-10 DIAGNOSIS — M4622 Osteomyelitis of vertebra, cervical region: Secondary | ICD-10-CM | POA: Diagnosis not present

## 2023-06-10 MED ORDER — AMOXICILLIN-POT CLAVULANATE 875-125 MG PO TABS
1.0000 | ORAL_TABLET | Freq: Two times a day (BID) | ORAL | 3 refills | Status: AC
Start: 2023-06-10 — End: 2023-10-08

## 2023-06-10 NOTE — Patient Instructions (Signed)
Please start amoxicillin-clav today 1 tablet twice a day  See me in may or June this year and will talk about stopping medication

## 2023-06-10 NOTE — Progress Notes (Signed)
[facility]  Patient Active Problem List   Diagnosis Date Noted   BPH with obstruction/lower urinary tract symptoms 03/03/2023   Epidural abscess 11/25/2022   Bacteremia due to Escherichia coli 11/23/2022   Coronary artery calcification 11/23/2022   Aortic atherosclerosis (HCC) 11/23/2022   Normocytic anemia 11/23/2022   Bilateral chronic knee pain 04/27/2021   Hyperlipidemia 05/30/2016   Essential hypertension 05/30/2016   Chronic lung disease 05/30/2016   Tobacco use 05/30/2016      Subjective:    Patient ID: Frederick Andrews, male    DOB: January 22, 1951, 73 y.o.   MRN: [id number]  No chief complaint on file.   HPI:  Frederick Andrews is a 73 y.o. male here for hospital f/u ecoli bsi/cervical om and s/p cspine hardware stabilization   12/19/22 id clinic f/u Patient developed 4 days prior to this visit immediately after ceftriaxone infusion --> legs/feet go cold, dyspnea/chest tightness, tingling in the whole body. Sx lasts an hour Today no infusion yet and and no sx See a&P  03/31/23 id clinic f/u See a&p for detail. Switched to cipro during 8/22 due to adverse effect to ceftriaxone. 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     atorvastatin (LIPITOR) 40 MG tablet Take 1 tablet (40 mg total) by mouth daily. 90 tablet 1   ciprofloxacin (CIPRO) 500 MG tablet Take 1 tablet (500 mg total) by mouth 2 (two) times daily. 60 tablet 2   fluticasone-salmeterol (ADVAIR) 250-50 MCG/ACT AEPB USE 1 INHALATION BY MOUTH TWICE DAILY     losartan (COZAAR) 50 MG tablet Take 50 mg by mouth daily.     tamsulosin (FLOMAX) 0.4 MG CAPS capsule Take 1 capsule (0.4 mg total) by mouth 2 (two) times daily. 60 capsule 11   No facility-administered medications prior to visit.     Social History   Socioeconomic History   Marital status: Married    Spouse name: Not on file   Number of children: Not on file   Years of education: Not on file   Highest education level: Not on file  Occupational History   Not on file  Tobacco Use   Smoking status: Every Day    Current packs/day: 1.00    Average packs/day: 1 pack/day for 40.0 years (40.0 ttl pk-yrs)    Types: Cigarettes   Smokeless tobacco: Not on file   Tobacco comments:    Working on decreasing the number of cigarettes per day.  Vaping Use   Vaping status: Never Used  Substance and Sexual Activity   Alcohol use: No    Alcohol/week: 0.0 standard drinks of alcohol   Drug use: No   Sexual activity:  Not on file  Other Topics Concern   Not on file  Social History Narrative   Not on file   Social Drivers of Health   Financial Resource Strain: Low Risk  (11/20/2022)   Received from [facility]   Overall Financial Resource Strain (CARDIA)    Difficulty of Paying Living Expenses: Not hard at all  Food Insecurity: No Food Insecurity (11/25/2022)   Hunger Vital Sign    Worried About Running Out of Food in the Last Year: Never true    Ran Out of Food in the Last Year: Never true  Transportation Needs: No Transportation Needs (11/25/2022)   PRAPARE - Administrator, Civil Service (Medical): No    Lack of Transportation (Non-Medical): No  Physical Activity:  Insufficiently Active (06/06/2022)   [facility]   Exercise Vital Sign    Days of Exercise per Week: 2 days    Minutes of Exercise per Session: 10 min  Stress: No Stress Concern Present (06/06/2022)   Received from [facility] - Occupational Stress Questionnaire    Feeling of Stress : Only a little  Social Connections: Socially Integrated (06/06/2022)   Received from [facility]   Social Network    How would you rate your social network (family, work, friends)?: Good participation with social networks  Intimate Partner Violence: Not At Risk (11/25/2022)   Humiliation, Afraid, Rape, and Kick questionnaire    Fear of Current or Ex-Partner: No    Emotionally Abused: No    Physically Abused: No    Sexually Abused: No     ROS: All other ros negative   Objective:    BP (!) 162/76   Pulse 63   Temp (!) 97.4 F (36.3 C) (Oral)   Wt 128 lb (58.1 kg)   BMI 21.97 kg/m  Nursing note and vital signs reviewed.  Physical Exam      General/constitutional: no distress, pleasant HEENT: Normocephalic, PER, Conj Clear, EOMI, Oropharynx clear Neck supple CV: rrr no mrg Lungs: clear to auscultation, normal respiratory effort Abd: Soft, Nontender Ext: no edema Skin: No Rash Neuro: nonfocal MSK: no peripheral joint swelling/tenderness/warmth; back spines nontender       Labs: Lab Results  Component Value Date   WBC 6.8 03/31/2023   HGB 14.5 03/31/2023   HCT 45.3 03/31/2023   MCV 88.3 03/31/2023   PLT 243 03/31/2023   Last metabolic panel Lab Results  Component Value Date   GLUCOSE 95 03/31/2023   NA 140 03/31/2023   K 5.4 (H) 03/31/2023   CL 106 03/31/2023   CO2 26 03/31/2023   BUN 24 03/31/2023   CREATININE 0.98 03/31/2023   EGFR 92 01/30/2023   CALCIUM 9.5 03/31/2023   PHOS 3.0 11/28/2022   PROT 7.2 03/31/2023   ALBUMIN 2.1 (L) 11/28/2022   BILITOT 0.4 03/31/2023   ALKPHOS 238 (H) 11/25/2022   AST 17  03/31/2023   ALT 25 03/31/2023   ANIONGAP 10 11/28/2022      Component Value Date/Time   CRP 5.9 03/31/2023 0900   CRP <3.0 03/06/2023 0900   CRP <3.0 01/30/2023 0910     Previous opat labs 8/20 cbc 3.8/11.1/283; cr 0.77; lft 36/46/184  Crp 4 <-- 17 @ 8/12 <-- 27 @ 12/02/22   Micro:  Serology:  Imaging: Reviewed    11/24/22 mri cspine Narrative & Impression  CLINICAL DATA:  Cervical radiculopathy. Infection suspected. Severe neck and shoulder pain. Bacteremia.  EXAM: MRI CERVICAL SPINE WITHOUT AND WITH CONTRAST   TECHNIQUE: Multiplanar and multiecho pulse sequences of the cervical spine, to include the craniocervical junction and cervicothoracic junction, were obtained without and with intravenous contrast.   CONTRAST:  5mL GADAVIST GADOBUTROL 1 MMOL/ML IV SOLN   COMPARISON:  CT of the cervical spine 11/22/2022   FINDINGS: Alignment: 3 mm anterolisthesis is present at C3-4. 2 mm anterolisthesis is present at C4-5. Straightening of the normal cervical lordosis is present.   Vertebrae: Signal and enhancement is present diffusely in the C4 and C5 vertebral bodies. Abnormal enhancement extends into the posterior elements both levels.   Cord: Cord is compressed the C4-5 level less than 6 mm. Increased T2 signal is present within the cord at C4 and C5.   Extensive dural enhancement extends C2 through T1-2 posteriorly. This is most prominent from C2-3 through C7. A right ventral epidural abscess measures 6 x 2 x 12 mm posterior to the C5 vertebral body. A left ventral epidural abscess measures 4 x 2 x 10 mm posterior to the C5 vertebral body. Cord size and signal is normal above the C3 vertebral level and below the C6 vertebral level.   Posterior Fossa, vertebral arteries, paraspinal tissues: Prevertebral edema and enhancement extends from the C1-2 level through C6. No abnormal fluid or enhancement extends into the mediastinum. The craniocervical junction is  normal. Vertebral arteries are patent bilaterally.   Disc levels:   C2-3: Abnormal dural enhancement is present without focal stenosis.   C3-4: More prominent circumferential dural enhancement is present. Uncovertebral spurring contributes to mild right foraminal stenosis.   C4-5: Extensive epidural enhancement compresses the spinal cord to less than 6 mm. Enhancing soft tissue extends into the foramina bilaterally. Severe left foraminal stenosis is secondary to uncovertebral and facet hypertrophy.   C5-6: Circumferential epidural enhancement is present. Moderate foraminal stenosis is present bilaterally.   C6-7: Abnormal dural and epidural enhancement is present without significant central canal stenosis. Abnormal enhancement extends into the foramina.   C7-T1: Mild posterior dural enhancement is present. No focal disc disease or stenosis is present.   IMPRESSION: 1. 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 2. Extensive dural enhancement extends from the C1-2 level through T1-2 posteriorly. This is most prominent from C2-3 through C7. 3. Increased T2 signal is present within the cervical spinal cord at C4 and C5 consistent with spinal cord edema. 4. Prevertebral edema and enhancement extends from the C1-2 level through C6. No extension into the mediastinum. 5. Severe left foraminal stenosis at C4-5. 6. Moderate foraminal stenosis bilaterally at C5-6. 7. Mild right foraminal stenosis at C3-4. 8. Mild posterior dural enhancement at C7-T1 without significant central canal stenosis. 9. Abnormal enhancement extends into the foramina bilaterally at C4-5, C5-6, and C6-7.     Assessment & Plan:   Problem List Items Addressed This Visit   None       No orders of the defined types were placed in this encounter.    Abx: 05/11/23-c cipro  10/29-1/12/25 cefadroxil  1000 mg po bid  8/22-10/29 cipro  7/27-8/21 ceftriaxone   7/27-29 vanc 7/30 cefazolin periop    73 year old male who emigrated from [place] in 1990s presented with shoulder pain radiating to his neck and a fall admitted 11/22/22 with:    Ecoli bacteremia -- unclear source  MRI cervical showed 6X2X 12  mm and 4X26X 10 mm epidural abscess C4/C5, extensive dural and enhancement C1-2 through T1-2, prevertebral edema C1-C6  Cervical spine om -- s/p acd c4-5 and c5-6 arthrodesis with structural fixation and allograft anterior plate fixation Z6-1 on 7/30   7/26 bcx and 7/30 operative cx ecoli (S cipro, amp-sulb; no cefazolin card to reflect new mic cut off </=2; R amp, bactrim)    Repeat bcx 7/28 and 7/30 ngtd     7/27 tte with moderate ar but suspect age related valve issue rather than endocarditis. This is a very quick onset of sx and very low risk for IE from gram negative so I do not feel IE w/u warranted with tee. However, I am not opposed if primary team want to pursue and will defer to them     Shoulders imaging reviewed -- clinically not consistent with septic arthritis   ------------------------ 12/19/22 id assessment Stopped ceftriaxone yesterday due to abnormal infusion related effect to delayed onset will given cipro for another 1-2 months then cefadroxil for another 6-12 months or indefinitely We don't have cefazolin mic but we don't have high rate of cephalexin resistance in ecoli   Will call [facility] to remove picc  Start cipro today 500 po twice a day for 2 months   See me in 6 weeks  01/30/23 id clinic assessment Tolerating ciprofloxacin 500 mg po bid well; no muscle ache/pain, n/v/rash/diarrhea; no joint pain Neck pain gone; no lower extremity weakness. Patient very happy Will plan another month of cipro that would give a good 3 months of "high concentrating drug" at the surgical site and transition then to suppressive abx with cefadroxil there after  Labs  today   ------------------------- 03/06/23 id assessment Reviewed previous labs Lab Results  Component Value Date   CRP 5.9 03/31/2023   Patient started on cefadroxil a week ago Mild pain when rigorous activity but feels well otherwise  No n/v/diarrhea/rash No f/c  Discussed slight uncertainty of cefadroxil as we can't test susceptibility for now If crp rising or increasing neck pain will switch to amox-clav  F/u 4 weeks   03/31/2023 Doing really well no pain at all. Full active rom Tolerating cefadroxil -- it appears either the infection is cured or that cefadroxil is working against the [place] Recent labs reviewed looking great with normal crp previously on cipro Discuss trialling antibiotics in about a year to ascertain cure and we'll discuss again summer 2025 Labs today F/u 6 months   06/10/23 Doing very well Hives had resolved off cefadroxil Tolerated amox in the past Given side effect profile will switch to augmentin -- (reviewed ecoli susceptibility profile and sensitive to amp-sulb) Follow up in may 2025 to discuss abx off trial Can stop cipro today and start amox-clav  Allergy list updated    Follow-up: Return in about 3 months (around 09/07/2023).      Raymondo Band, MD [facility] 06/10/2023, 8:53 AM

## 2023-07-02 ENCOUNTER — Ambulatory Visit (HOSPITAL_COMMUNITY): Payer: 59 | Attending: Cardiology

## 2023-07-02 DIAGNOSIS — I371 Nonrheumatic pulmonary valve insufficiency: Secondary | ICD-10-CM | POA: Insufficient documentation

## 2023-07-02 LAB — ECHOCARDIOGRAM COMPLETE
Area-P 1/2: 2.46 cm2
P 1/2 time: 496 ms
S' Lateral: 3.7 cm

## 2023-07-07 ENCOUNTER — Telehealth: Payer: Self-pay

## 2023-07-07 NOTE — Telephone Encounter (Signed)
 Called patient advised of below they verbalized understanding.

## 2023-07-07 NOTE — Telephone Encounter (Signed)
-----   Message from [facility] sent at 07/03/2023  5:28 PM EST ----- Please let Mr. Arcia know that his echocardiogram showed normal heart squeeze and function.  There is some mild thickening and stiffening of the left lower chamber the heart.  Recommend continuing to work towards good blood pressure control.  There is mild leaking of the mitral valve and mild to moderate leaking of the aortic valve.  Pulmonic valve regurgitation remains severe.  There is mild enlargement of the aortic root.  Overall unchanged from prior echocardiogram. Continue current medications and follow-up with Dr. Jacques Navy as planned, echo results will be discussed further on follow up.

## 2023-07-15 ENCOUNTER — Encounter: Payer: Self-pay | Admitting: Internal Medicine

## 2023-07-15 ENCOUNTER — Ambulatory Visit: Payer: 59 | Attending: Internal Medicine | Admitting: Internal Medicine

## 2023-07-15 VITALS — BP 138/64 | HR 61 | Ht 65.0 in | Wt 126.0 lb

## 2023-07-15 DIAGNOSIS — R7881 Bacteremia: Secondary | ICD-10-CM

## 2023-07-15 DIAGNOSIS — E785 Hyperlipidemia, unspecified: Secondary | ICD-10-CM

## 2023-07-15 DIAGNOSIS — I700 Atherosclerosis of aorta: Secondary | ICD-10-CM | POA: Diagnosis not present

## 2023-07-15 DIAGNOSIS — I351 Nonrheumatic aortic (valve) insufficiency: Secondary | ICD-10-CM

## 2023-07-15 DIAGNOSIS — I371 Nonrheumatic pulmonary valve insufficiency: Secondary | ICD-10-CM

## 2023-07-15 DIAGNOSIS — I2510 Atherosclerotic heart disease of native coronary artery without angina pectoris: Secondary | ICD-10-CM

## 2023-07-15 DIAGNOSIS — G062 Extradural and subdural abscess, unspecified: Secondary | ICD-10-CM

## 2023-07-15 DIAGNOSIS — B9620 Unspecified Escherichia coli [E. coli] as the cause of diseases classified elsewhere: Secondary | ICD-10-CM

## 2023-07-15 DIAGNOSIS — I10 Essential (primary) hypertension: Secondary | ICD-10-CM | POA: Diagnosis not present

## 2023-07-15 DIAGNOSIS — I480 Paroxysmal atrial fibrillation: Secondary | ICD-10-CM | POA: Diagnosis not present

## 2023-07-15 NOTE — Progress Notes (Signed)
 Cardiology Office Note:  .   Date:  07/15/2023  ID:  Frederick Andrews, DOB [birth year], MRN [id number] PCP: Rosemary Holms  [facility] Providers Cardiologist:  Parke Poisson, MD    History of Present Illness: .   Frederick Andrews is a 73 y.o. male with a prior history of sepsis, valvular heart disease, supraventricular tachycardia, discitis, osteomyelitis, cervical spine surgery presents for follow-up.  During his hospital stay he had 2 brief episodes of atrial fibrillation and no recurrence as an outpatient, felt to be provoked by sepsis.  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  This required emergent anterior cervical decompression of C4 and C5 and C5 and C6 arthrodesis with disc stable fixation and allograft anterior plate fixation by neurosurgery.  Patient was treated with IV antibiotics and was well at our last follow-up in January.  Noted to have moderate aortic valve regurgitation and severe pulmonary valve regurgitation with normal biventricular function on echocardiogram in hospital, planned for echocardiogram.  Echocardiogram showed preserved LV function with EF 55%, grade 1 diastolic dysfunction, borderline abnormal strain, normal right heart with normal right heart pressure, mildly dilated LA, mild mitral valve regurgitation, mild to moderate aortic valve regurgitation, and severe pulmonary valve regurgitation, overall unchanged.  Mildly elevated blood pressures previously and today borderline elevated.  Patient has not yet taken his antihypertensive therapy which is losartan 50 mg daily, blood pressure may be elevated at our visits but is normal at home.  Continues on atorvastatin with an LDL of 80, last laboratory check was in December 2024 LDL remained about 80, we emphasized healthy diet.  Recheck with his PCP this Friday if need be, if LDL above 80 consider adding Zetia 10 mg daily.  He  has quite a few atorvastatin 20 mg tablets at home, I have instructed his family member that it is okay to take 2 of the 20 mg tablets to complete the prescription.  ROS: No complaints, per HPI.  Studies Reviewed: .         Risk Assessment/Calculations:    CHA2DS2-VASc Score = 3   This indicates a 3.2% annual risk of stroke. The patient's score is based upon: CHF History: 0 HTN History: 1 Diabetes History: 0 Stroke History: 0 Vascular Disease History: 1 Age Score: 1 Gender Score: 0            Physical Exam:   VS:  BP 138/64   Pulse 61   Ht 5\' 5"  (1.651 m)   Wt 126 lb (57.2 kg)   SpO2 99%   BMI 20.97 kg/m    Wt Readings from Last 3 Encounters:  07/15/23 126 lb (57.2 kg)  06/10/23 128 lb (58.1 kg)  05/06/23 128 lb 12.8 oz (58.4 kg)    GEN: Well nourished, well developed in no acute distress NECK: No JVD; No carotid bruits CARDIAC: RRR, no murmurs, rubs, gallops RESPIRATORY:  Clear to auscultation without rales, wheezing or rhonchi  ABDOMEN: Soft, non-tender, non-distended EXTREMITIES:  No edema; No deformity   ASSESSMENT AND PLAN: .    Valvular Heart Disease Mild to moderate aortic valve regurgitation and severe pulmonary valve regurgitation, stable since last echo. Normal biventricular function on echocardiogram, suggesting no hemodynamic sequela of valvular heart disease at this time. Asymptomatic. -Schedule follow-up echocardiogram in 1 year, March 2026.  Atrial Fibrillation Two brief episodes during hospitalization for sepsis, likely driven by the infection. No recurrence  of atrial fibrillation on outpatient cardiac monitor post-hospitalization. Brief episodes of SVT and second-degree AV block Mobitz 1 noted. No current anticoagulation due to prior neurosurgery and lack of recurrent atrial fibrillation. -Continue monitoring for symptoms of palpitations or irregular rhythms. -If recurrent atrial fibrillation or flutter is noted, consider anticoagulation. -EKG at  next visit in 6 months.   Sepsis Recovered from severe sepsis. Currently on antibiotics (Augmentin) for discitis and osteomyelitis of the C4, C5 with ventral epidural abscess at C5. -Continue current treatment as directed by infectious disease specialist.   Hypertension Elevated blood pressure noted during visit, but reported to be normal at home. Currently on unspecified antihypertensive medication. -Blood sugar overall normal at home, treated with losartan 50 mg daily, continue.   Hyperlipidemia Atorvastatin increased to 40mg  daily due to coronary calcifications noted on CT scan. -Continue Atorvastatin 40mg  daily.  LDL is 80 in December 2024, if LDL remains above 70 with primary care doctor checks, can consider adding Zetia 10 mg daily.   Follow-up -6 months -Echo in 12 months.      Signed, Parke Poisson, MD

## 2023-07-15 NOTE — Patient Instructions (Signed)
 Medication Instructions:  No Changes  Lab Work: None  Testing/Procedures: None  Follow-Up: At [facility], you and your health needs are our priority.  As part of our continuing mission to provide you with exceptional heart care, we have created designated Provider Care Teams.  These Care Teams include your primary Cardiologist (physician) and Advanced Practice Providers (APPs -  Physician Assistants and Nurse Practitioners) who all work together to provide you with the care you need, when you need it.  We recommend signing up for the patient portal called "MyChart".  Sign up information is provided on this After Visit Summary.  MyChart is used to connect with patients for Virtual Visits (Telemedicine).  Patients are able to view lab/test results, encounter notes, upcoming appointments, etc.  Non-urgent messages can be sent to your provider as well.   To learn more about what you can do with MyChart, go to [email or web address].    Your next appointment:   6 month(s)  Provider:   Parke Poisson, MD

## 2023-10-09 ENCOUNTER — Ambulatory Visit: Payer: 59 | Admitting: Internal Medicine

## 2023-10-09 ENCOUNTER — Other Ambulatory Visit: Payer: Self-pay

## 2023-10-09 ENCOUNTER — Encounter: Payer: Self-pay | Admitting: Internal Medicine

## 2023-10-09 VITALS — BP 147/67 | HR 61 | Temp 97.9°F | Ht 65.0 in | Wt 124.0 lb

## 2023-10-09 DIAGNOSIS — M4620 Osteomyelitis of vertebra, site unspecified: Secondary | ICD-10-CM

## 2023-10-09 DIAGNOSIS — B9620 Unspecified Escherichia coli [E. coli] as the cause of diseases classified elsewhere: Secondary | ICD-10-CM

## 2023-10-09 DIAGNOSIS — T847XXD Infection and inflammatory reaction due to other internal orthopedic prosthetic devices, implants and grafts, subsequent encounter: Secondary | ICD-10-CM | POA: Diagnosis not present

## 2023-10-09 NOTE — Progress Notes (Signed)
 [facility]  Patient Active Problem List   Diagnosis Date Noted   BPH with obstruction/lower urinary tract symptoms 03/03/2023   Epidural abscess 11/25/2022   Bacteremia due to Escherichia coli 11/23/2022   Coronary artery calcification 11/23/2022   Aortic atherosclerosis (HCC) 11/23/2022   Normocytic anemia 11/23/2022   Bilateral chronic knee pain 04/27/2021   Hyperlipidemia 05/30/2016   Essential hypertension 05/30/2016   Chronic lung disease 05/30/2016   Tobacco use 05/30/2016      Subjective:    Patient ID: Frederick Andrews, male    DOB: 05-26-1950, 73 y.o.   MRN: [id number]  Chief Complaint  Patient presents with   Follow-up    HPI:  Frederick Andrews is a 72 y.o. male here for hospital f/u ecoli bsi/cervical om and s/p cspine hardware stabilization   12/19/22 id clinic f/u Patient developed 4 days prior to this visit immediately after ceftriaxone  infusion --> legs/feet go cold, dyspnea/chest tightness, tingling in the whole body. Sx lasts an hour Today no infusion yet and and no sx See a&P  03/31/23 id clinic f/u See a&p for detail. Switched to cipro  during 8/22 due to adverse effect to ceftriaxone . 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     atorvastatin  (LIPITOR) 40 MG tablet Take 1 tablet (40 mg total) by mouth daily. 90 tablet 1   fluticasone-salmeterol (ADVAIR) 250-50 MCG/ACT AEPB USE 1 INHALATION BY MOUTH TWICE DAILY     losartan  (COZAAR ) 50 MG tablet Take 50 mg by mouth daily.     tamsulosin  (FLOMAX ) 0.4 MG CAPS capsule Take 1 capsule (0.4 mg total) by mouth 2 (two) times daily. 60 capsule 11   No facility-administered medications prior to visit.     Social History   Socioeconomic History   Marital status: Married    Spouse name: Not on file   Number of children: Not on file   Years of education: Not on file   Highest education level: Not on file  Occupational History   Not on file  Tobacco Use   Smoking status: Every Day    Current packs/day: 1.00    Average packs/day: 1 pack/day for 40.0 years (40.0 ttl pk-yrs)    Types: Cigarettes   Smokeless tobacco: Not on file   Tobacco comments:    Working on decreasing the number of cigarettes per day.  Vaping Use   Vaping status: Never Used  Substance and Sexual Activity   Alcohol use: No    Alcohol/week: 0.0 standard drinks of alcohol   Drug use: No   Sexual activity: Not  Currently    Partners: Female    Comment: married  Other Topics Concern   Not on file  Social History Narrative   Not on file   Social Drivers of Health   Financial Resource Strain: Low Risk  (08/08/2023)   Received from [facility]   Overall Financial Resource Strain (CARDIA)    Difficulty of Paying Living Expenses: Not hard at all  Food Insecurity: No Food Insecurity (08/08/2023)   Received from [facility]   Hunger Vital Sign    Worried About Running Out of Food in the Last Year: Never true    Ran Out of Food in the Last Year: Never true  Transportation Needs: No Transportation Needs (08/08/2023)   Received from [facility] - Transportation     Lack of Transportation (Medical): No    Lack of Transportation (Non-Medical): No  Physical Activity: Insufficiently Active (08/08/2023)   Received from [facility]   Exercise Vital Sign    Days of Exercise per Week: 3 days    Minutes of Exercise per Session: 10 min  Stress: No Stress Concern Present (08/08/2023)   Received from [facility] - Occupational Stress Questionnaire    Feeling of Stress : Only a little  Social Connections: Socially Integrated (08/08/2023)   Received from [facility]   Social Network    How would you rate your social network (family, work, friends)?: Good participation with social networks  Intimate Partner Violence: Not At Risk (08/08/2023)   Received from [facility]   HITS    Over the last 12 months how often did your partner physically hurt you?: Never    Over the last 12 months how often did your partner insult you or talk down to you?: Never    Over the last 12 months how often did your partner threaten you with physical harm?: Never    Over the last 12 months how often did your partner scream or curse at you?: Never     ROS: All other ros negative   Objective:    BP (!) 147/67   Pulse 61   Temp 97.9 F (36.6 C) (Temporal)   Ht 5' 5 (1.651 m)   Wt 124 lb (56.2 kg)   SpO2 98%   BMI 20.63 kg/m  Nursing note and vital signs reviewed.  Physical Exam      General/constitutional: no distress, pleasant HEENT: Normocephalic, PER, Conj Clear, EOMI, Oropharynx clear Neck supple CV: rrr no mrg Lungs: clear to auscultation, normal respiratory effort Abd: Soft, Nontender Ext: no edema Skin: No Rash Neuro: nonfocal MSK: no peripheral joint swelling/tenderness/warmth; back spines nontender       Labs: Lab Results  Component Value Date   WBC 6.8 03/31/2023   HGB 14.5 03/31/2023   HCT 45.3 03/31/2023   MCV 88.3 03/31/2023   PLT 243 03/31/2023   Last metabolic panel Lab Results   Component Value Date   GLUCOSE 95 03/31/2023   NA 140 03/31/2023   K 5.4 (H) 03/31/2023   CL 106 03/31/2023   CO2 26 03/31/2023   BUN 24 03/31/2023   CREATININE 0.98 03/31/2023   EGFR 92 01/30/2023   CALCIUM  9.5 03/31/2023   PHOS 3.0 11/28/2022   PROT 7.2 03/31/2023   ALBUMIN 2.1 (L) 11/28/2022   BILITOT 0.4 03/31/2023   ALKPHOS 238 (H) 11/25/2022   AST 17 03/31/2023   ALT 25 03/31/2023   ANIONGAP 10 11/28/2022  Component Value Date/Time   CRP 5.9 03/31/2023 0900   CRP <3.0 03/06/2023 0900   CRP <3.0 01/30/2023 0910     Previous opat labs 8/20 cbc 3.8/11.1/283; cr 0.77; lft 36/46/184  Crp 4 <-- 17 @ 8/12 <-- 27 @ 12/02/22   Micro:  Serology:  Imaging: Reviewed    11/24/22 mri cspine Narrative & Impression  CLINICAL DATA:  Cervical radiculopathy. Infection suspected. Severe neck and shoulder pain. Bacteremia.   EXAM: MRI CERVICAL SPINE WITHOUT AND WITH CONTRAST   TECHNIQUE: Multiplanar and multiecho pulse sequences of the cervical spine, to include the craniocervical junction and cervicothoracic junction, were obtained without and with intravenous contrast.   CONTRAST:  5mL GADAVIST  GADOBUTROL  1 MMOL/ML IV SOLN   COMPARISON:  CT of the cervical spine 11/22/2022   FINDINGS: Alignment: 3 mm anterolisthesis is present at C3-4. 2 mm anterolisthesis is present at C4-5. Straightening of the normal cervical lordosis is present.   Vertebrae: Signal and enhancement is present diffusely in the C4 and C5 vertebral bodies. Abnormal enhancement extends into the posterior elements both levels.   Cord: Cord is compressed the C4-5 level less than 6 mm. Increased T2 signal is present within the cord at C4 and C5.   Extensive dural enhancement extends C2 through T1-2 posteriorly. This is most prominent from C2-3 through C7. A right ventral epidural abscess measures 6 x 2 x 12 mm posterior to the C5 vertebral body. A left ventral epidural abscess measures 4 x  2 x 10 mm posterior to the C5 vertebral body. Cord size and signal is normal above the C3 vertebral level and below the C6 vertebral level.   Posterior Fossa, vertebral arteries, paraspinal tissues: Prevertebral edema and enhancement extends from the C1-2 level through C6. No abnormal fluid or enhancement extends into the mediastinum. The craniocervical junction is normal. Vertebral arteries are patent bilaterally.   Disc levels:   C2-3: Abnormal dural enhancement is present without focal stenosis.   C3-4: More prominent circumferential dural enhancement is present. Uncovertebral spurring contributes to mild right foraminal stenosis.   C4-5: Extensive epidural enhancement compresses the spinal cord to less than 6 mm. Enhancing soft tissue extends into the foramina bilaterally. Severe left foraminal stenosis is secondary to uncovertebral and facet hypertrophy.   C5-6: Circumferential epidural enhancement is present. Moderate foraminal stenosis is present bilaterally.   C6-7: Abnormal dural and epidural enhancement is present without significant central canal stenosis. Abnormal enhancement extends into the foramina.   C7-T1: Mild posterior dural enhancement is present. No focal disc disease or stenosis is present.   IMPRESSION: 1. 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 2. Extensive dural enhancement extends from the C1-2 level through T1-2 posteriorly. This is most prominent from C2-3 through C7. 3. Increased T2 signal is present within the cervical spinal cord at C4 and C5 consistent with spinal cord edema. 4. Prevertebral edema and enhancement extends from the C1-2 level through C6. No extension into the mediastinum. 5. Severe left foraminal stenosis at C4-5. 6. Moderate foraminal stenosis bilaterally at C5-6. 7. Mild right foraminal stenosis at C3-4. 8. Mild  posterior dural enhancement at C7-T1 without significant central canal stenosis. 9. Abnormal enhancement extends into the foramina bilaterally at C4-5, C5-6, and C6-7.     Assessment & Plan:   Problem List Items Addressed This Visit   None Visit Diagnoses  Hardware complicating wound infection, subsequent encounter    -  Primary   Relevant Orders   CBC with Differential/Platelet   C-reactive protein   Comp Met (CMET)   Sedimentation rate     Vertebral osteomyelitis (HCC)               No orders of the defined types were placed in this encounter.    Abx: 06/10/23-10/09/23 augmentin   05/11/23-06/10/23 cipro   10/29-1/12/25 cefadroxil  1000 mg po bid  8/22-10/29 cipro   7/27-8/21 ceftriaxone    7/27-29 vanc 7/30 cefazolin  periop    73 year old male who emigrated from [place] in 1990s presented with shoulder pain radiating to his neck and a fall admitted 11/22/22 with:    Ecoli bacteremia -- unclear source  MRI cervical showed 6X2X 12 mm and 4X26X 10 mm epidural abscess C4/C5, extensive dural and enhancement C1-2 through T1-2, prevertebral edema C1-C6  Cervical spine om -- s/p acd c4-5 and c5-6 arthrodesis with structural fixation and allograft anterior plate fixation W0-9 on 11/26/22   11/22/22 bcx and 7/30 operative cx ecoli (S cipro , amp-sulb; no cefazolin  card to reflect new mic cut off </=2; R amp, bactrim)    Repeat bcx 7/28 and 7/30 ngtd     7/27 tte with moderate ar but suspect age related valve issue rather than endocarditis. This is a very quick onset of sx and very low risk for IE from gram negative so I do not feel IE w/u warranted with tee. However, I am not opposed if primary team want to pursue and will defer to them     Shoulders imaging reviewed -- clinically not consistent with septic arthritis   ------------------------ 12/19/22 id assessment Stopped ceftriaxone  yesterday due to abnormal infusion related effect to delayed onset will given  cipro  for another 1-2 months then cefadroxil  for another 6-12 months or indefinitely We don't have cefazolin  mic but we don't have high rate of cephalexin  resistance in ecoli   Will call [facility] to remove picc  Start cipro  today 500 po twice a day for 2 months   See me in 6 weeks  01/30/23 id clinic assessment Tolerating ciprofloxacin  500 mg po bid well; no muscle ache/pain, n/v/rash/diarrhea; no joint pain Neck pain gone; no lower extremity weakness. Patient very happy Will plan another month of cipro  that would give a good 3 months of high concentrating drug at the surgical site and transition then to suppressive abx with cefadroxil  there after  Labs today   ------------------------- 03/06/23 id assessment Reviewed previous labs  Patient started on cefadroxil  a week ago Mild pain when rigorous activity but feels well otherwise  No n/v/diarrhea/rash No f/c  Discussed slight uncertainty of cefadroxil  as we can't test susceptibility for now If crp rising or increasing neck pain will switch to amox-clav  F/u 4 weeks   03/31/2023 Doing really well no pain at all. Full active rom Tolerating cefadroxil  -- it appears either the infection is cured or that cefadroxil  is working against the [place] Recent labs reviewed looking great with normal crp previously on cipro  Discuss trialling antibiotics in about a year to ascertain cure and we'll discuss again summer 2025 Labs today F/u 6 months   06/10/23 Doing very well Hives had resolved off cefadroxil  Tolerated amox in the past Given side effect profile will switch to augmentin  -- (reviewed ecoli susceptibility profile and sensitive to amp-sulb) Follow up in may 2025 to discuss abx off trial Can stop cipro  today and start amox-clav  Allergy list updated  10/09/23 id assessment Continues to do well on abx with normal inflammatory labs on abx. He is a year out. Inquires about stopping abx trial and we discussed possibility of  relapse and he is willing to try  Stop abx today Labs today Revisit in 3, 6, and 12 weeks with repeat clinical evaluation/labs  If all look well 12 weeks from now will discharge. If rising crp or focal sx in neck will restart abx plus/minus get repeat imaging     Follow-up: Return in about 3 weeks (around 10/30/2023).      Jamesetta Mcbride, MD [facility] 10/09/2023, 8:54 AM

## 2023-10-09 NOTE — Patient Instructions (Signed)
 We'll stop antibiotics today    We'll do blood tests today and repeat visit/testing in around 3 weeks, 6 weeks, 12 weeks. If you continue to do well and tests look normal off abx at 12 weeks, we'll discharge you from clinic (likely infection is cured

## 2023-10-10 LAB — CBC WITH DIFFERENTIAL/PLATELET
Absolute Lymphocytes: 1521 {cells}/uL (ref 850–3900)
Absolute Monocytes: 392 {cells}/uL (ref 200–950)
Basophils Absolute: 42 {cells}/uL (ref 0–200)
Basophils Relative: 0.8 %
Eosinophils Absolute: 461 {cells}/uL (ref 15–500)
Eosinophils Relative: 8.7 %
HCT: 48.1 % (ref 38.5–50.0)
Hemoglobin: 15.3 g/dL (ref 13.2–17.1)
MCH: 29.1 pg (ref 27.0–33.0)
MCHC: 31.8 g/dL — ABNORMAL LOW (ref 32.0–36.0)
MCV: 91.6 fL (ref 80.0–100.0)
MPV: 10.1 fL (ref 7.5–12.5)
Monocytes Relative: 7.4 %
Neutro Abs: 2883 {cells}/uL (ref 1500–7800)
Neutrophils Relative %: 54.4 %
Platelets: 205 10*3/uL (ref 140–400)
RBC: 5.25 10*6/uL (ref 4.20–5.80)
RDW: 13.1 % (ref 11.0–15.0)
Total Lymphocyte: 28.7 %
WBC: 5.3 10*3/uL (ref 3.8–10.8)

## 2023-10-10 LAB — COMPREHENSIVE METABOLIC PANEL WITH GFR
AG Ratio: 1.7 (calc) (ref 1.0–2.5)
ALT: 21 U/L (ref 9–46)
AST: 17 U/L (ref 10–35)
Albumin: 4.5 g/dL (ref 3.6–5.1)
Alkaline phosphatase (APISO): 81 U/L (ref 35–144)
BUN: 24 mg/dL (ref 7–25)
CO2: 24 mmol/L (ref 20–32)
Calcium: 9.5 mg/dL (ref 8.6–10.3)
Chloride: 106 mmol/L (ref 98–110)
Creat: 1.06 mg/dL (ref 0.70–1.28)
Globulin: 2.7 g/dL (ref 1.9–3.7)
Glucose, Bld: 77 mg/dL (ref 65–99)
Potassium: 4.8 mmol/L (ref 3.5–5.3)
Sodium: 140 mmol/L (ref 135–146)
Total Bilirubin: 0.6 mg/dL (ref 0.2–1.2)
Total Protein: 7.2 g/dL (ref 6.1–8.1)
eGFR: 74 mL/min/{1.73_m2} (ref >=60)

## 2023-10-10 LAB — C-REACTIVE PROTEIN: CRP: <3 mg/L (ref <8.0)

## 2023-10-10 LAB — SEDIMENTATION RATE: Sed Rate: 17 mm/h (ref 0–20)

## 2023-10-30 ENCOUNTER — Other Ambulatory Visit: Payer: Self-pay

## 2023-10-30 ENCOUNTER — Encounter: Payer: Self-pay | Admitting: Internal Medicine

## 2023-10-30 ENCOUNTER — Ambulatory Visit: Admitting: Internal Medicine

## 2023-10-30 VITALS — BP 126/69 | HR 57 | Temp 97.6°F | Wt 124.0 lb

## 2023-10-30 DIAGNOSIS — R7881 Bacteremia: Secondary | ICD-10-CM | POA: Diagnosis not present

## 2023-10-30 DIAGNOSIS — T847XXD Infection and inflammatory reaction due to other internal orthopedic prosthetic devices, implants and grafts, subsequent encounter: Secondary | ICD-10-CM

## 2023-10-30 DIAGNOSIS — M4620 Osteomyelitis of vertebra, site unspecified: Secondary | ICD-10-CM

## 2023-10-30 DIAGNOSIS — B9620 Unspecified Escherichia coli [E. coli] as the cause of diseases classified elsewhere: Secondary | ICD-10-CM | POA: Diagnosis not present

## 2023-10-30 NOTE — Progress Notes (Signed)
 [facility]  Patient Active Problem List   Diagnosis Date Noted   BPH with obstruction/lower urinary tract symptoms 03/03/2023   Epidural abscess 11/25/2022   Bacteremia due to Escherichia coli 11/23/2022   Coronary artery calcification 11/23/2022   Aortic atherosclerosis (HCC) 11/23/2022   Normocytic anemia 11/23/2022   Bilateral chronic knee pain 04/27/2021   Hyperlipidemia 05/30/2016   Essential hypertension 05/30/2016   Chronic lung disease 05/30/2016   Tobacco use 05/30/2016      Subjective:    Patient ID: Frederick Andrews, male    DOB: February 18, 1951, 73 y.o.   MRN: [id number]  Chief Complaint  Patient presents with   Follow-up    HPI:  Frederick Andrews is a 73 y.o. male here for hospital f/u ecoli bsi/cervical om and s/p cspine hardware stabilization   12/19/22 id clinic f/u Patient developed 4 days prior to this visit immediately after ceftriaxone  infusion --> legs/feet go cold, dyspnea/chest tightness, tingling in the whole body. Sx lasts an hour Today no infusion yet and and no sx See a&P  03/31/23 id clinic f/u See a&p for detail. Switched to cipro  during 8/22 due to adverse effect to ceftriaxone . 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     atorvastatin  (LIPITOR) 40 MG tablet Take 1 tablet (40 mg total) by mouth daily. 90 tablet 1   fluticasone-salmeterol (ADVAIR) 250-50 MCG/ACT AEPB USE 1 INHALATION BY MOUTH TWICE DAILY     losartan  (COZAAR ) 50 MG tablet Take 50 mg by mouth daily.     tamsulosin  (FLOMAX ) 0.4 MG CAPS capsule Take 1 capsule (0.4 mg total) by mouth 2 (two) times daily. 60 capsule 11   No facility-administered medications prior to visit.     Social History   Socioeconomic History   Marital status: Married    Spouse name: Not on file   Number of children: Not on file   Years of education: Not on file   Highest education level: Not on file  Occupational History   Not on file  Tobacco Use   Smoking status: Every Day    Current packs/day: 1.00    Average packs/day: 1 pack/day for 40.0 years (40.0 ttl pk-yrs)    Types: Cigarettes   Smokeless tobacco: Not on file   Tobacco comments:    Working on decreasing the number of cigarettes per day.  Vaping Use   Vaping status: Never Used  Substance and Sexual Activity   Alcohol use: No    Alcohol/week:  0.0 standard drinks of alcohol   Drug use: No   Sexual activity: Not Currently    Partners: Female    Comment: married  Other Topics Concern   Not on file  Social History Narrative   Not on file   Social Drivers of Health   Financial Resource Strain: Low Risk  (10/21/2023)   Received from [facility]   Overall Financial Resource Strain (CARDIA)    Difficulty of Paying Living Expenses: Not hard at all  Food Insecurity: No Food Insecurity (10/21/2023)   Received from [facility]   Hunger Vital Sign    Within the past 12 months, you worried that your food would run out before you got the money to buy more.: Never true    Within the past 12  months, the food you bought just didn't last and you didn't have money to get more.: Never true  Transportation Needs: No Transportation Needs (10/21/2023)   Received from [facility] - Transportation    Lack of Transportation (Medical): No    Lack of Transportation (Non-Medical): No  Physical Activity: Insufficiently Active (10/21/2023)   Received from [facility]   Exercise Vital Sign    On average, how many days per week do you engage in moderate to strenuous exercise (like a brisk walk)?: 1 day    On average, how many minutes do you engage in exercise at this level?: 10 min  Stress: No Stress Concern Present (10/21/2023)   Received from [facility] - Occupational Stress Questionnaire    Feeling of Stress : Not at all  Social Connections: Moderately Integrated (10/21/2023)   Received from [facility]   Social Network    How would you rate your social network (family, work, friends)?: Adequate participation with social networks  Intimate Partner Violence: Not At Risk (10/21/2023)   Received from [facility]   HITS    Over the last 12 months how often did your partner physically hurt you?: Never    Over the last 12 months how often did your partner insult you or talk down to you?: Never    Over the last 12 months how often did your partner threaten you with physical harm?: Never    Over the last 12 months how often did your partner scream or curse at you?: Never     ROS: All other ros negative   Objective:    BP 126/69   Pulse (!) 57   Temp 97.6 F (36.4 C) (Oral)   Wt 124 lb (56.2 kg)   SpO2 97%   BMI 20.63 kg/m  Nursing note and vital signs reviewed.  Physical Exam      General/constitutional: no distress, pleasant HEENT: Normocephalic, PER, Conj Clear, EOMI, Oropharynx clear Neck supple CV: rrr no mrg Lungs: clear to auscultation, normal respiratory effort Abd: Soft, Nontender Ext: no edema Skin: No  Rash Neuro: nonfocal MSK: no peripheral joint swelling/tenderness/warmth; back spines nontender       Labs: Lab Results  Component Value Date   WBC 5.3 10/09/2023   HGB 15.3 10/09/2023   HCT 48.1 10/09/2023   MCV 91.6 10/09/2023   PLT 205 10/09/2023   Last metabolic panel Lab Results  Component Value Date   GLUCOSE 77 10/09/2023   NA 140 10/09/2023   K 4.8 10/09/2023   CL 106 10/09/2023   CO2 24 10/09/2023   BUN 24 10/09/2023   CREATININE 1.06 10/09/2023  EGFR 74 10/09/2023   CALCIUM  9.5 10/09/2023   PHOS 3.0 11/28/2022   PROT 7.2 10/09/2023   ALBUMIN 2.1 (L) 11/28/2022   BILITOT 0.6 10/09/2023   ALKPHOS 238 (H) 11/25/2022   AST 17 10/09/2023   ALT 21 10/09/2023   ANIONGAP 10 11/28/2022      Component Value Date/Time   CRP <3.0 10/09/2023 0903   CRP 5.9 03/31/2023 0900   CRP <3.0 03/06/2023 0900     Previous opat labs 8/20 cbc 3.8/11.1/283; cr 0.77; lft 36/46/184  Crp 4 <-- 17 @ 8/12 <-- 27 @ 12/02/22   Micro:  Serology:  Imaging: Reviewed    11/24/22 mri cspine Narrative & Impression  CLINICAL DATA:  Cervical radiculopathy. Infection suspected. Severe neck and shoulder pain. Bacteremia.   EXAM: MRI CERVICAL SPINE WITHOUT AND WITH CONTRAST   TECHNIQUE: Multiplanar and multiecho pulse sequences of the cervical spine, to include the craniocervical junction and cervicothoracic junction, were obtained without and with intravenous contrast.   CONTRAST:  5mL GADAVIST  GADOBUTROL  1 MMOL/ML IV SOLN   COMPARISON:  CT of the cervical spine 11/22/2022   FINDINGS: Alignment: 3 mm anterolisthesis is present at C3-4. 2 mm anterolisthesis is present at C4-5. Straightening of the normal cervical lordosis is present.   Vertebrae: Signal and enhancement is present diffusely in the C4 and C5 vertebral bodies. Abnormal enhancement extends into the posterior elements both levels.   Cord: Cord is compressed the C4-5 level less than 6 mm. Increased  T2 signal is present within the cord at C4 and C5.   Extensive dural enhancement extends C2 through T1-2 posteriorly. This is most prominent from C2-3 through C7. A right ventral epidural abscess measures 6 x 2 x 12 mm posterior to the C5 vertebral body. A left ventral epidural abscess measures 4 x 2 x 10 mm posterior to the C5 vertebral body. Cord size and signal is normal above the C3 vertebral level and below the C6 vertebral level.   Posterior Fossa, vertebral arteries, paraspinal tissues: Prevertebral edema and enhancement extends from the C1-2 level through C6. No abnormal fluid or enhancement extends into the mediastinum. The craniocervical junction is normal. Vertebral arteries are patent bilaterally.   Disc levels:   C2-3: Abnormal dural enhancement is present without focal stenosis.   C3-4: More prominent circumferential dural enhancement is present. Uncovertebral spurring contributes to mild right foraminal stenosis.   C4-5: Extensive epidural enhancement compresses the spinal cord to less than 6 mm. Enhancing soft tissue extends into the foramina bilaterally. Severe left foraminal stenosis is secondary to uncovertebral and facet hypertrophy.   C5-6: Circumferential epidural enhancement is present. Moderate foraminal stenosis is present bilaterally.   C6-7: Abnormal dural and epidural enhancement is present without significant central canal stenosis. Abnormal enhancement extends into the foramina.   C7-T1: Mild posterior dural enhancement is present. No focal disc disease or stenosis is present.   IMPRESSION: 1. 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 2. Extensive dural enhancement extends from the C1-2 level through T1-2 posteriorly. This is most prominent from C2-3 through C7. 3. Increased T2 signal is present within the cervical spinal cord at C4 and  C5 consistent with spinal cord edema. 4. Prevertebral edema and enhancement extends from the C1-2 level through C6. No extension into the mediastinum. 5. Severe left foraminal stenosis at C4-5. 6. Moderate foraminal stenosis bilaterally  at C5-6. 7. Mild right foraminal stenosis at C3-4. 8. Mild posterior dural enhancement at C7-T1 without significant central canal stenosis. 9. Abnormal enhancement extends into the foramina bilaterally at C4-5, C5-6, and C6-7.     Assessment & Plan:   Problem List Items Addressed This Visit   None Visit Diagnoses       Hardware complicating wound infection, subsequent encounter    -  Primary   Relevant Orders   C-reactive protein   Sedimentation rate   CBC     Vertebral osteomyelitis (HCC)               No orders of the defined types were placed in this encounter.    Abx: 06/10/23-10/09/23 augmentin   05/11/23-06/10/23 cipro   10/29-1/12/25 cefadroxil  1000 mg po bid  8/22-10/29 cipro   7/27-8/21 ceftriaxone    7/27-29 vanc 7/30 cefazolin  periop    73 year old male who emigrated from [place] in 1990s presented with shoulder pain radiating to his neck and a fall admitted 11/22/22 with:    Ecoli bacteremia -- unclear source  MRI cervical showed 6X2X 12 mm and 4X26X 10 mm epidural abscess C4/C5, extensive dural and enhancement C1-2 through T1-2, prevertebral edema C1-C6  Cervical spine om -- s/p acd c4-5 and c5-6 arthrodesis with structural fixation and allograft anterior plate fixation r5-3 on 11/26/22   11/22/22 bcx and 7/30 operative cx ecoli (S cipro , amp-sulb; no cefazolin  card to reflect new mic cut off </=2; R amp, bactrim)    Repeat bcx 7/28 and 7/30 ngtd     7/27 tte with moderate ar but suspect age related valve issue rather than endocarditis. This is a very quick onset of sx and very low risk for IE from gram negative so I do not feel IE w/u warranted with tee. However, I am not opposed if primary team want to pursue and  will defer to them     Shoulders imaging reviewed -- clinically not consistent with septic arthritis   ------------------------ 12/19/22 id assessment Stopped ceftriaxone  yesterday due to abnormal infusion related effect to delayed onset will given cipro  for another 1-2 months then cefadroxil  for another 6-12 months or indefinitely We don't have cefazolin  mic but we don't have high rate of cephalexin  resistance in ecoli   Will call [facility] to remove picc  Start cipro  today 500 po twice a day for 2 months   See me in 6 weeks  01/30/23 id clinic assessment Tolerating ciprofloxacin  500 mg po bid well; no muscle ache/pain, n/v/rash/diarrhea; no joint pain Neck pain gone; no lower extremity weakness. Patient very happy Will plan another month of cipro  that would give a good 3 months of high concentrating drug at the surgical site and transition then to suppressive abx with cefadroxil  there after  Labs today   ------------------------- 03/06/23 id assessment Reviewed previous labs  Patient started on cefadroxil  a week ago Mild pain when rigorous activity but feels well otherwise  No n/v/diarrhea/rash No f/c  Discussed slight uncertainty of cefadroxil  as we can't test susceptibility for now If crp rising or increasing neck pain will switch to amox-clav  F/u 4 weeks   03/31/2023 Doing really well no pain at all. Full active rom Tolerating cefadroxil  -- it appears either the infection is cured or that cefadroxil  is working against the [place] Recent labs reviewed looking great with normal crp previously on cipro  Discuss trialling antibiotics in about a year to ascertain cure and we'll discuss again summer 2025 Labs today F/u 6 months  06/10/23 Doing very well Hives had resolved off cefadroxil  Tolerated amox in the past Given side effect profile will switch to augmentin  -- (reviewed ecoli susceptibility profile and sensitive to amp-sulb) Follow up in may 2025 to discuss abx off  trial Can stop cipro  today and start amox-clav  Allergy list updated     10/09/23 id assessment Continues to do well on abx with normal inflammatory labs on abx. He is a year out. Inquires about stopping abx trial and we discussed possibility of relapse and he is willing to try  Stop abx today Labs today Revisit in 3, 6, and 12 weeks with repeat clinical evaluation/labs  If all look well 12 weeks from now will discharge. If rising crp or focal sx in neck will restart abx plus/minus get repeat imaging  10/30/23 id clinic assessment No complaint today 3 weeks post abx cessation and appears no sign of breakthrough/relapse  Labs today Monitor for sign/sx of relapse F/u 2 more months for another labs repeat and evaluation and hope to discharge if doing ok at that time      Follow-up: No follow-ups on file.      Constance ONEIDA Passer, MD [facility] 10/30/2023, 8:53 AM

## 2023-10-30 NOTE — Patient Instructions (Signed)
 Please do labs today  I am glad your neck is still doing very well   If things continue to go well like this without pain, fever, chill, malaise, we can watch and just check in again in 2 months

## 2023-10-31 LAB — CBC
HCT: 45.6 % (ref 38.5–50.0)
Hemoglobin: 14.7 g/dL (ref 13.2–17.1)
MCH: 29.3 pg (ref 27.0–33.0)
MCHC: 32.2 g/dL (ref 32.0–36.0)
MCV: 91 fL (ref 80.0–100.0)
MPV: 10.3 fL (ref 7.5–12.5)
Platelets: 203 Thousand/uL (ref 140–400)
RBC: 5.01 Million/uL (ref 4.20–5.80)
RDW: 12.9 % (ref 11.0–15.0)
WBC: 4.9 Thousand/uL (ref 3.8–10.8)

## 2023-10-31 LAB — SEDIMENTATION RATE: Sed Rate: 2 mm/h (ref 0–20)

## 2023-10-31 LAB — C-REACTIVE PROTEIN: CRP: <3 mg/L (ref <8.0)

## 2023-11-07 NOTE — Progress Notes (Signed)
     Outpatient colonoscopy completed. Please see below for a brief description of endoscopic findings, and plan of care moving forward. For a full report, please see colonoscopy note in the media tab dated 11/07/2023.   Endoscopic findings:  A single sessile 5 mm polyp was found in the descending colon. A single-piece polypectomy was performed using a cold snare in the descending colon. The polyp was completely removed. The polyp was completely retrieved. Three endoclips were successfully applied to the descending colon for the purpose of tissue positioning. LOT# [id number] Exp.03-25-2026 x2 LOT# [id number] Exp. 02-22-2026 x1.     Medium internal and external hemorrhoids were noted.    Plan of care moving forward:   Taking into account your age, and the fact that you had no worrisome lesions on your exam, no further screening colonoscopy is recommended. Await pathology results       Please feel free to contact me with any questions or concerns   Thank you for allowing me to take part in the care of this patient.     Rosalynn Pottier, M.D., M.B.A.

## 2023-11-19 ENCOUNTER — Ambulatory Visit: Admitting: Urology

## 2023-12-12 ENCOUNTER — Encounter: Payer: Self-pay | Admitting: Urology

## 2023-12-12 ENCOUNTER — Ambulatory Visit (INDEPENDENT_AMBULATORY_CARE_PROVIDER_SITE_OTHER): Admitting: Urology

## 2023-12-12 VITALS — BP 161/72 | HR 65 | Ht 62.0 in | Wt 124.0 lb

## 2023-12-12 DIAGNOSIS — N138 Other obstructive and reflux uropathy: Secondary | ICD-10-CM

## 2023-12-12 DIAGNOSIS — Z87448 Personal history of other diseases of urinary system: Secondary | ICD-10-CM | POA: Diagnosis not present

## 2023-12-12 DIAGNOSIS — N401 Enlarged prostate with lower urinary tract symptoms: Secondary | ICD-10-CM | POA: Diagnosis not present

## 2023-12-12 DIAGNOSIS — Z87898 Personal history of other specified conditions: Secondary | ICD-10-CM

## 2023-12-12 LAB — URINALYSIS, ROUTINE W REFLEX MICROSCOPIC
Bilirubin, UA: NEGATIVE
Glucose, UA: NEGATIVE
Ketones, UA: NEGATIVE
Leukocytes,UA: NEGATIVE
Nitrite, UA: NEGATIVE
Protein,UA: NEGATIVE
RBC, UA: NEGATIVE
Specific Gravity, UA: 1.025 (ref 1.005–1.030)
Urobilinogen, Ur: 0.2 mg/dL (ref 0.2–1.0)
pH, UA: 5.5 (ref 5.0–7.5)

## 2023-12-12 NOTE — Progress Notes (Signed)
 Assessment: 1. BPH with obstruction/lower urinary tract symptoms   2. History of urinary retention    Plan: Continue tamsulosin  BID Return to office in 1 year  Chief Complaint:  Chief Complaint  Patient presents with   Benign Prostatic Hypertrophy    History of Present Illness:  Frederick Andrews is a 73 y.o. male who is seen for further evaluation of urinary retention. 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  He initially voided postop but then developed urinary retention.  Bladder scan showed a volume of 970 mL.  He underwent I&O catheterization with initial volume of 750 mL.  He was started on tamsulosin .  A Foley catheter was placed and he was discharged with the Foley catheter. No prior history of urinary retention.  He did notice some gradual worsening of his symptoms associated with his neck pain prior to his hospitalization.  CT abdomen and pelvis with contrast from 11/23/2022 showed normal kidneys without mass or obstruction, mild bladder distention, and normal-appearing prostate. His foley was removed on 12/05/22 following a voiding trial in the office.  He was able to void 1-2 times following foley removal.  He presented to ED later on 12/05/22 with urinary retention.  Foley placed with return of 1200 ml. His tamsulosin  was increased to BID. His foley was removed on 12/12/22 after a successful voiding trial in the office.   He was seen in the ER on 12/12/22 with retention and a foley was replaced.  His daughter reports that he was having problems with constipation which has since resolved.  He continued on tamsulosin  twice daily.  He was on Cipro  twice daily per infectious disease. His Foley was removed after a successful voiding trial on 12/23/2022.  He was instructed on clean intermittent catheterization and supplies were given for use on a as needed basis. At his visit in 9/24, he had been voiding spontaneously and had not  required intermittent catheterization.  He continued on tamsulosin .  He was voiding with a good stream.  No dysuria or gross hematuria. IPSS = 2  At his visit in November 2024, he continued on tamsulosin .  He was not having any lower urinary tract symptoms   No dysuria or gross hematuria. IPSS = 0. PSA 11/24: 1.2  He returns today for follow-up.  He continues on tamsulosin  twice daily.  His urinary symptoms are very well-controlled.  He is voiding with a good stream and emptying his bladder well.  No dysuria or gross hematuria. IPSS = 3.  The patient's son interpreted during the visit today.  Portions of the above documentation were copied from a prior visit for review purposes only.   Past Medical History:  Past Medical History:  Diagnosis Date   Hyperlipemia    Hypertension     Past Surgical History:  Past Surgical History:  Procedure Laterality Date   ANTERIOR CERVICAL DECOMPRESSION FOR EPIDURAL ABSCESS N/A 11/25/2022   Procedure: ANTERIOR CERVICAL DECOMPRESSION FUSION FOR EPIDURAL ABSCESS CERVICAL FOUR-FIVE AND CERVICAL FIVE-SIX;  Surgeon: Colon Shove, MD;  Location: [facility] OR;  Service: Neurosurgery;  Laterality: N/A;    Allergies:  Allergies  Allergen Reactions   Rocephin  [Ceftriaxone ] Shortness Of Breath   Cefadroxil  Hives    Family History:  Family History  Problem Relation Age of Onset   Heart disease Mother    Hypertension Mother    Hyperlipidemia Mother    Hyperlipidemia Other     Social History:  Social History  Tobacco Use   Smoking status: Every Day    Current packs/day: 1.00    Average packs/day: 1 pack/day for 40.0 years (40.0 ttl pk-yrs)    Types: Cigarettes   Tobacco comments:    Working on decreasing the number of cigarettes per day.  Vaping Use   Vaping status: Never Used  Substance Use Topics   Alcohol use: No    Alcohol/week: 0.0 standard drinks of alcohol   Drug use: No    ROS: Constitutional:  Negative for fever, chills, weight  loss CV: Negative for chest pain, previous MI, hypertension Respiratory:  Negative for shortness of breath, wheezing, sleep apnea, frequent cough GI:  Negative for nausea, vomiting, bloody stool, GERD   Physical exam: BP (!) 161/72   Pulse 65   Ht 5' 2 (1.575 m)   Wt 124 lb (56.2 kg)   BMI 22.68 kg/m  GENERAL APPEARANCE:  Well appearing, well developed, well nourished, NAD HEENT:  Atraumatic, normocephalic, oropharynx clear NECK:  Supple without lymphadenopathy or thyromegaly ABDOMEN:  Soft, non-tender, no masses EXTREMITIES:  Moves all extremities well, without clubbing, cyanosis, or edema NEUROLOGIC:  Alert and oriented x 3, normal gait, CN II-XII grossly intact MENTAL STATUS:  appropriate BACK:  Non-tender to palpation, No CVAT SKIN:  Warm, dry, and intact  Results: U/A: negative

## 2024-01-06 ENCOUNTER — Encounter: Payer: Self-pay | Admitting: Internal Medicine

## 2024-01-06 ENCOUNTER — Other Ambulatory Visit: Payer: Self-pay

## 2024-01-06 ENCOUNTER — Ambulatory Visit: Admitting: Internal Medicine

## 2024-01-06 VITALS — BP 164/70 | HR 60 | Temp 97.7°F | Ht 62.0 in | Wt 128.0 lb

## 2024-01-06 DIAGNOSIS — M869 Osteomyelitis, unspecified: Secondary | ICD-10-CM

## 2024-01-06 DIAGNOSIS — T847XXD Infection and inflammatory reaction due to other internal orthopedic prosthetic devices, implants and grafts, subsequent encounter: Secondary | ICD-10-CM | POA: Diagnosis not present

## 2024-01-06 NOTE — Progress Notes (Signed)
 [facility]  Patient Active Problem List   Diagnosis Date Noted   BPH with obstruction/lower urinary tract symptoms 03/03/2023   Epidural abscess 11/25/2022   Bacteremia due to Escherichia coli 11/23/2022   Coronary artery calcification 11/23/2022   Aortic atherosclerosis (HCC) 11/23/2022   Normocytic anemia 11/23/2022   Bilateral chronic knee pain 04/27/2021   Hyperlipidemia 05/30/2016   Essential hypertension 05/30/2016   Chronic lung disease 05/30/2016   Tobacco use 05/30/2016      Subjective:    Patient ID: Frederick Andrews, male    DOB: 1950/10/05, 73 y.o.   MRN: [id number]  Chief Complaint  Patient presents with   Follow-up    HPI:  Frederick Andrews is a 73 y.o. male here for hospital f/u ecoli bsi/cervical om and s/p cspine hardware stabilization   12/19/22 id clinic f/u Patient developed 4 days prior to this visit immediately after ceftriaxone  infusion --> legs/feet go cold, dyspnea/chest tightness, tingling in the whole body. Sx lasts an hour Today no infusion yet and and no sx See a&P  03/31/23 id clinic f/u See a&p for detail. Switched to cipro  during 8/22 due to adverse effect to ceftriaxone . 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 He took amoxicillin  before and was fine He has no issue today    10/09/23 id clinic f/u Doing well No complaint Asking about stopping abx   10/30/23 id clinic f/u Stopped abx last visit 10/09/23 No complaint today Neck feels normal   01/06/24 id clinic f/u No complaint at all today about neck Felt very  well See a&p for further details   Allergies  Allergen Reactions   Rocephin  [Ceftriaxone ] Shortness Of Breath   Cefadroxil  Hives      Outpatient Medications Prior to Visit  Medication Sig Dispense Refill   albuterol  (VENTOLIN  HFA) 108 (90 Base) MCG/ACT inhaler Inhale 2 puffs into the lungs as needed for wheezing.     atorvastatin  (LIPITOR) 40 MG tablet Take 1 tablet (40 mg total) by mouth daily. 90 tablet 1   fluticasone-salmeterol (ADVAIR) 250-50 MCG/ACT AEPB USE 1 INHALATION BY MOUTH TWICE DAILY     losartan  (COZAAR ) 50 MG tablet Take 50 mg by mouth daily.     tamsulosin  (FLOMAX ) 0.4 MG CAPS capsule Take 1 capsule (0.4 mg total) by mouth 2 (two) times daily. 60 capsule 11   No facility-administered medications prior to visit.     Social History   Socioeconomic History   Marital status: Married    Spouse name: Not on file   Number of children: Not on file   Years of education: Not on file   Highest education level: Not on file  Occupational History   Not on file  Tobacco Use   Smoking status: Every Day    Current packs/day: 1.00    Average packs/day: 1 pack/day for 40.0 years (40.0 ttl pk-yrs)    Types: Cigarettes   Smokeless tobacco: Not on file   Tobacco comments:    Working on decreasing the number of cigarettes per day.  Vaping  Use   Vaping status: Never Used  Substance and Sexual Activity   Alcohol use: No    Alcohol/week: 0.0 standard drinks of alcohol   Drug use: No   Sexual activity: Not Currently    Partners: Female    Comment: married  Other Topics Concern   Not on file  Social History Narrative   Not on file   Social Drivers of Health   Financial Resource Strain: Low Risk  (10/21/2023)   Received from [facility]   Overall Financial Resource Strain (CARDIA)    Difficulty of Paying Living Expenses: Not hard at all  Food Insecurity: No Food Insecurity (10/21/2023)   Received from [facility]   Hunger Vital Sign    Within the past 12 months,  you worried that your food would run out before you got the money to buy more.: Never true    Within the past 12 months, the food you bought just didn't last and you didn't have money to get more.: Never true  Transportation Needs: No Transportation Needs (10/21/2023)   Received from [facility] - Transportation    Lack of Transportation (Medical): No    Lack of Transportation (Non-Medical): No  Physical Activity: Insufficiently Active (10/21/2023)   Received from [facility]   Exercise Vital Sign    On average, how many days per week do you engage in moderate to strenuous exercise (like a brisk walk)?: 1 day    On average, how many minutes do you engage in exercise at this level?: 10 min  Stress: No Stress Concern Present (10/21/2023)   Received from [facility] - Occupational Stress Questionnaire    Feeling of Stress : Not at all  Social Connections: Moderately Integrated (10/21/2023)   Received from [facility]   Social Network    How would you rate your social network (family, work, friends)?: Adequate participation with social networks  Intimate Partner Violence: Not At Risk (10/21/2023)   Received from [facility]   HITS    Over the last 12 months how often did your partner physically hurt you?: Never    Over the last 12 months how often did your partner insult you or talk down to you?: Never    Over the last 12 months how often did your partner threaten you with physical harm?: Never    Over the last 12 months how often did your partner scream or curse at you?: Never     ROS: All other ros negative   Objective:    BP (!) 164/70   Pulse 60   Temp 97.7 F (36.5 C) (Temporal)   Ht 5' 2 (1.575 m)   Wt 128 lb (58.1 kg)   SpO2 97%   BMI 23.41 kg/m  Nursing note and vital signs reviewed.  Physical Exam      General/constitutional: no distress, pleasant HEENT: Normocephalic, PER, Conj Clear, EOMI, Oropharynx  clear Neck supple CV: rrr no mrg Lungs: clear to auscultation, normal respiratory effort Abd: Soft, Nontender Ext: no edema Skin: No Rash Neuro: nonfocal MSK: no peripheral joint swelling/tenderness/warmth; back spines nontender       Labs: Lab Results  Component Value Date   WBC 4.9 10/30/2023   HGB 14.7 10/30/2023   HCT 45.6 10/30/2023   MCV 91.0 10/30/2023   PLT 203 10/30/2023   Last metabolic panel Lab Results  Component Value Date   GLUCOSE 77 10/09/2023   NA 140  10/09/2023   K 4.8 10/09/2023   CL 106 10/09/2023   CO2 24 10/09/2023   BUN 24 10/09/2023   CREATININE 1.06 10/09/2023   EGFR 74 10/09/2023   CALCIUM  9.5 10/09/2023   PHOS 3.0 11/28/2022   PROT 7.2 10/09/2023   ALBUMIN 2.1 (L) 11/28/2022   BILITOT 0.6 10/09/2023   ALKPHOS 238 (H) 11/25/2022   AST 17 10/09/2023   ALT 21 10/09/2023   ANIONGAP 10 11/28/2022      Component Value Date/Time   CRP <3.0 10/30/2023 0857   CRP <3.0 10/09/2023 0903   CRP 5.9 03/31/2023 0900      Micro:  Serology:  Imaging: Reviewed    11/24/22 mri cspine Narrative & Impression  CLINICAL DATA:  Cervical radiculopathy. Infection suspected. Severe neck and shoulder pain. Bacteremia.   EXAM: MRI CERVICAL SPINE WITHOUT AND WITH CONTRAST   TECHNIQUE: Multiplanar and multiecho pulse sequences of the cervical spine, to include the craniocervical junction and cervicothoracic junction, were obtained without and with intravenous contrast.   CONTRAST:  5mL GADAVIST  GADOBUTROL  1 MMOL/ML IV SOLN   COMPARISON:  CT of the cervical spine 11/22/2022   FINDINGS: Alignment: 3 mm anterolisthesis is present at C3-4. 2 mm anterolisthesis is present at C4-5. Straightening of the normal cervical lordosis is present.   Vertebrae: Signal and enhancement is present diffusely in the C4 and C5 vertebral bodies. Abnormal enhancement extends into the posterior elements both levels.   Cord: Cord is compressed the C4-5 level  less than 6 mm. Increased T2 signal is present within the cord at C4 and C5.   Extensive dural enhancement extends C2 through T1-2 posteriorly. This is most prominent from C2-3 through C7. A right ventral epidural abscess measures 6 x 2 x 12 mm posterior to the C5 vertebral body. A left ventral epidural abscess measures 4 x 2 x 10 mm posterior to the C5 vertebral body. Cord size and signal is normal above the C3 vertebral level and below the C6 vertebral level.   Posterior Fossa, vertebral arteries, paraspinal tissues: Prevertebral edema and enhancement extends from the C1-2 level through C6. No abnormal fluid or enhancement extends into the mediastinum. The craniocervical junction is normal. Vertebral arteries are patent bilaterally.   Disc levels:   C2-3: Abnormal dural enhancement is present without focal stenosis.   C3-4: More prominent circumferential dural enhancement is present. Uncovertebral spurring contributes to mild right foraminal stenosis.   C4-5: Extensive epidural enhancement compresses the spinal cord to less than 6 mm. Enhancing soft tissue extends into the foramina bilaterally. Severe left foraminal stenosis is secondary to uncovertebral and facet hypertrophy.   C5-6: Circumferential epidural enhancement is present. Moderate foraminal stenosis is present bilaterally.   C6-7: Abnormal dural and epidural enhancement is present without significant central canal stenosis. Abnormal enhancement extends into the foramina.   C7-T1: Mild posterior dural enhancement is present. No focal disc disease or stenosis is present.   IMPRESSION: 1. 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 2. Extensive dural enhancement extends from the C1-2 level through T1-2 posteriorly. This is most prominent from C2-3 through C7. 3. Increased T2 signal is present within the  cervical spinal cord at C4 and C5 consistent with spinal cord edema. 4. Prevertebral edema and enhancement extends from the C1-2 level through C6. No extension into the mediastinum. 5. Severe left foraminal stenosis at  C4-5. 6. Moderate foraminal stenosis bilaterally at C5-6. 7. Mild right foraminal stenosis at C3-4. 8. Mild posterior dural enhancement at C7-T1 without significant central canal stenosis. 9. Abnormal enhancement extends into the foramina bilaterally at C4-5, C5-6, and C6-7.     Assessment & Plan:   Problem List Items Addressed This Visit   None Visit Diagnoses       Osteomyelitis, unspecified site, unspecified type (HCC)    -  Primary     Hardware complicating wound infection, subsequent encounter                No orders of the defined types were placed in this encounter.    Abx: 06/10/23-10/09/23 augmentin   05/11/23-06/10/23 cipro   10/29-1/12/25 cefadroxil  1000 mg po bid  8/22-10/29 cipro   7/27-8/21 ceftriaxone    7/27-29 vanc 7/30 cefazolin  periop    73 year old male who emigrated from [place] in 1990s presented with shoulder pain radiating to his neck and a fall admitted 11/22/22 with:    Ecoli bacteremia -- unclear source  MRI cervical showed 6X2X 12 mm and 4X26X 10 mm epidural abscess C4/C5, extensive dural and enhancement C1-2 through T1-2, prevertebral edema C1-C6  Cervical spine om -- s/p acd c4-5 and c5-6 arthrodesis with structural fixation and allograft anterior plate fixation r5-3 on 11/26/22   11/22/22 bcx and 7/30 operative cx ecoli (S cipro , amp-sulb; no cefazolin  card to reflect new mic cut off </=2; R amp, bactrim)    Repeat bcx 7/28 and 7/30 ngtd     7/27 tte with moderate ar but suspect age related valve issue rather than endocarditis. This is a very quick onset of sx and very low risk for IE from gram negative so I do not feel IE w/u warranted with tee. However, I am not opposed if primary team want to pursue and will defer  to them     Shoulders imaging reviewed -- clinically not consistent with septic arthritis   ------------------------ 12/19/22 id assessment Stopped ceftriaxone  yesterday due to abnormal infusion related effect to delayed onset will given cipro  for another 1-2 months then cefadroxil  for another 6-12 months or indefinitely We don't have cefazolin  mic but we don't have high rate of cephalexin  resistance in ecoli   Will call [facility] to remove picc  Start cipro  today 500 po twice a day for 2 months   See me in 6 weeks  01/30/23 id clinic assessment Tolerating ciprofloxacin  500 mg po bid well; no muscle ache/pain, n/v/rash/diarrhea; no joint pain Neck pain gone; no lower extremity weakness. Patient very happy Will plan another month of cipro  that would give a good 3 months of high concentrating drug at the surgical site and transition then to suppressive abx with cefadroxil  there after  Labs today   ------------------------- 03/06/23 id assessment Reviewed previous labs  Patient started on cefadroxil  a week ago Mild pain when rigorous activity but feels well otherwise  No n/v/diarrhea/rash No f/c  Discussed slight uncertainty of cefadroxil  as we can't test susceptibility for now If crp rising or increasing neck pain will switch to amox-clav  F/u 4 weeks   03/31/2023 Doing really well no pain at all. Full active rom Tolerating cefadroxil  -- it appears either the infection is cured or that cefadroxil  is working against the [place] Recent labs reviewed looking great with normal crp previously on cipro  Discuss trialling antibiotics in about a year to ascertain cure and we'll discuss again summer 2025 Labs today F/u 6 months   06/10/23 Doing very well Hives  had resolved off cefadroxil  Tolerated amox in the past Given side effect profile will switch to augmentin  -- (reviewed ecoli susceptibility profile and sensitive to amp-sulb) Follow up in may 2025 to discuss abx off trial Can  stop cipro  today and start amox-clav  Allergy list updated     10/09/23 id assessment Continues to do well on abx with normal inflammatory labs on abx. He is a year out. Inquires about stopping abx trial and we discussed possibility of relapse and he is willing to try  Stop abx today Labs today Revisit in 3, 6, and 12 weeks with repeat clinical evaluation/labs  If all look well 12 weeks from now will discharge. If rising crp or focal sx in neck will restart abx plus/minus get repeat imaging  10/30/23 id clinic assessment No complaint today 3 weeks post abx cessation and appears no sign of breakthrough/relapse  Labs today Monitor for sign/sx of relapse F/u 2 more months for another labs repeat and evaluation and hope to discharge if doing ok at that time   01/06/24 id clinic assessment He is 3 months out of stopping antibiotics He is completely assymptomatic from the posterior neck pain / stiffness standpoint and felt 100% No fever, chill, malaise, decreased appetite  I strongly believe the infection is cured  No need for labs today; follow up as needed if posterior neck pain persistent/progressive/fever-chill   Follow-up: Return if symptoms worsen or fail to improve.      Frederick ONEIDA Passer, MD [facility] 01/06/2024, 8:53 AM

## 2024-01-20 ENCOUNTER — Other Ambulatory Visit: Payer: Self-pay | Admitting: Urology

## 2024-01-20 DIAGNOSIS — R339 Retention of urine, unspecified: Secondary | ICD-10-CM

## 2024-01-23 NOTE — Progress Notes (Signed)
 Subjective   HPI:  Frederick Andrews is a 73 y.o.  male who presents to the office with:  Chief Complaint  Patient presents with  . Follow-up    No concerns. Accompanied by daughter. Due to language barrier, an interpreter was offered but refused.   . Gaps In Care    Influenza vaccine// agrees Zoster vaccine// advised to receive at the pharmacy.   Presents today for scheduled 36-month follow-up regarding blood pressure and dizziness.  Patient has not had any recurrence of dizziness.  He has not noticed any palpitations, heart racing symptoms.  He has a history of paroxysmal atrial fibrillation and has upcoming follow-up with his cardiology team on 10/3.  He has not had any chest pain.  Patient reports some baseline shortness of breath but is a chronic smoker.  He is overdue for his CT scan.  Blood pressure readings have been well-controlled.  He does not have any acute concerns today as reported by his daughter who helps to translate as they declined formal translator services     PMH: Past medical history, Past surgical history, Social history, family history were reviewed as noted in EMR.  Pertinent for:  Past Medical History:  Diagnosis Date  . Hyperlipidemia   . Hypertension      Medications and allergies reviewed.  ROS: All systems were negative including constitutional, CV, respiratory, GI/GU except for those noted in the HPI.  Objective   Vital Signs: BP 139/70 (BP Location: Right Upper Arm)   Pulse 65   Temp 97 F (36.1 C) (Temporal)   Resp 16   Wt 127 lb 3.2 oz (57.7 kg)   SpO2 94%   BMI 22.01 kg/m   Wt Readings from Last 3 Encounters:  01/23/24 127 lb 3.2 oz (57.7 kg)  10/24/23 123 lb 6.4 oz (56 kg)  08/08/23 125 lb 9.6 oz (57 kg)   Physical Exam Vitals and nursing note reviewed.  Constitutional:      General: He is not in acute distress. HENT:     Head: Normocephalic and atraumatic.  Cardiovascular:     Rate and Rhythm: Normal rate and regular rhythm.      Pulses:          Radial pulses are 2+ on the right side and 2+ on the left side.     Heart sounds: No murmur heard. Pulmonary:     Effort: Pulmonary effort is normal.     Breath sounds: Normal breath sounds. No wheezing.  Musculoskeletal:     Right lower leg: No edema.     Left lower leg: No edema.  Skin:    General: Skin is warm and dry.  Neurological:     Mental Status: He is alert.  Psychiatric:        Mood and Affect: Mood and affect normal.        Cognition and Memory: Memory normal.        Judgment: Judgment normal.      Assessment/Plan   Orders Placed This Encounter  Procedures  . Fluad Trivalent Adjuvanted    1. Essential hypertension   2. Smokes less than 1 pack a day with greater than 25 pack year history   3. Paroxysmal atrial fibrillation (*)   4. Bilateral shoulder pain, unspecified chronicity   5. Immunization due     Essential hypertension Blood pressure well-controlled.  Continue with current regimen.  Follow-up with cardiology as scheduled on 10/3  Paroxysmal atrial fibrillation  (*)  No  evidence on exam today.  Patient has follow-up with cardiology team next week.  He will continue to monitor closely.  Dizziness has since resolved.  Smokes less than 1 pack a day with greater than 25 pack year history  Overdue for lung screening.  Patient's daughter was provided with contact information for central scheduling.  They will contact to schedule ASAP.  Bilateral shoulder pain  Notes mostly in the morning.  He does not have any hip pain.  He has had previous imaging including MRI and x-ray of left shoulder indicating moderate arthritis, right x-ray mild arthritis.  I discussed with patient options for treatment.  Avoid NSAIDs given his hypertension history.  Can use only as needed.  Recommended Tylenol .  I have provided with exercises and after visit summary.  We will follow-up with orthopedics or PT if needed.  Low suspicion for PMR.  5.  65+ flu vaccine  today  Follow up in about 7 months (around 08/22/2024) for AWV w/ CPE.  Future Appointments  Date Time Provider Department Center  02/06/2024 11:30 AM ITRI CT ITRI CT [facility]    Medications at end of visit today: Current Medications[1]  Patient Care Team: Lannie Gell, PA-C as PCP - General (Physician Assistant) Lauraine JULIANNA Lites, NP as Pulmonologist (Pulmonary Diseases) Niall Moreira, PA-C (Physician Assistant) Rosalynn Pottier, MD (Gastroenterology)         [1]  Current Outpatient Medications:  .  albuterol  sulfate HFA (PROVENTIL ,VENTOLIN ,PROAIR ) 108 (90 Base) MCG/ACT inhaler, INHALE 2 PUFFS BY MOUTH EVERY 4 TO 6 HOURS AS NEEDED FOR WHEEZING, Disp: 8.5 g, Rfl: 2 .  amoxicillin -clavulanate (AUGMENTIN ) 875-125 mg per tablet, Take one tablet by mouth 2 (two) times daily. (Patient not taking: Reported on 10/24/2023), Disp: , Rfl:  .  aspirin  81 MG chewable tablet, Chew one tablet (81 mg dose) by mouth daily., Disp: , Rfl:  .  atorvastatin  (LIPITOR) 80 mg tablet, Take one tablet (80 mg dose) by mouth daily., Disp: 90 tablet, Rfl: 1 .  fluticasone-salmeterol (WIXELA INHUB) 250-50 mcg/dose AEPB inhalation powder, USE 1 INHALATION BY MOUTH TWICE DAILY, Disp: 60 each, Rfl: 5 .  losartan  potassium (COZAAR ) 50 mg tablet, TAKE 1 TABLET(50 MG) BY MOUTH DAILY, Disp: 90 tablet, Rfl: 1 .  NA sulfate-potassium sulfate-magnesium sulfate (SUPREP BOWEL PREP) 17.5-3.13-1.6 GM/177ML, Take 177 mLs by mouth 2 (two) times., Disp: 354 mL, Rfl: 0 .  tamsulosin  (FLOMAX ) 0.4 mg CAPS, Take one capsule (0.4 mg dose) by mouth 2 (two) times daily., Disp: , Rfl:

## 2024-01-30 ENCOUNTER — Ambulatory Visit: Admitting: Internal Medicine

## 2024-01-30 ENCOUNTER — Ambulatory Visit: Attending: Internal Medicine | Admitting: Internal Medicine

## 2024-01-30 ENCOUNTER — Encounter: Payer: Self-pay | Admitting: Internal Medicine

## 2024-01-30 VITALS — BP 170/70 | HR 62 | Ht 64.0 in | Wt 128.8 lb

## 2024-01-30 DIAGNOSIS — I480 Paroxysmal atrial fibrillation: Secondary | ICD-10-CM | POA: Diagnosis not present

## 2024-01-30 DIAGNOSIS — I10 Essential (primary) hypertension: Secondary | ICD-10-CM

## 2024-01-30 DIAGNOSIS — I371 Nonrheumatic pulmonary valve insufficiency: Secondary | ICD-10-CM

## 2024-01-30 DIAGNOSIS — B9620 Unspecified Escherichia coli [E. coli] as the cause of diseases classified elsewhere: Secondary | ICD-10-CM

## 2024-01-30 DIAGNOSIS — R7881 Bacteremia: Secondary | ICD-10-CM

## 2024-01-30 DIAGNOSIS — I700 Atherosclerosis of aorta: Secondary | ICD-10-CM

## 2024-01-30 DIAGNOSIS — E785 Hyperlipidemia, unspecified: Secondary | ICD-10-CM

## 2024-01-30 DIAGNOSIS — I2510 Atherosclerotic heart disease of native coronary artery without angina pectoris: Secondary | ICD-10-CM | POA: Diagnosis not present

## 2024-01-30 DIAGNOSIS — I351 Nonrheumatic aortic (valve) insufficiency: Secondary | ICD-10-CM

## 2024-01-30 NOTE — Patient Instructions (Signed)
 Medication Instructions:  Your physician recommends that you continue on your current medications as directed. Please refer to the Current Medication list given to you today.  *If you need a refill on your cardiac medications before your next appointment, please call your pharmacy*  Lab Work: NONE If you have labs (blood work) drawn today and your tests are completely normal, you will receive your results only by: MyChart Message (if you have MyChart) OR A paper copy in the mail If you have any lab test that is abnormal or we need to change your treatment, we will call you to review the results.  Testing/Procedures: Echo  Your physician has requested that you have an echocardiogram. Echocardiography is a painless test that uses sound waves to create images of your heart. It provides your doctor with information about the size and shape of your heart and how well your heart's chambers and valves are working. This procedure takes approximately one hour. There are no restrictions for this procedure. Please do NOT wear cologne, perfume, aftershave, or lotions (deodorant is allowed). Please arrive 15 minutes prior to your appointment time.  Please note: We ask at that you not bring children with you during ultrasound (echo/ vascular) testing. Due to room size and safety concerns, children are not allowed in the ultrasound rooms during exams. Our front office staff cannot provide observation of children in our lobby area while testing is being conducted. An adult accompanying a patient to their appointment will only be allowed in the ultrasound room at the discretion of the ultrasound technician under special circumstances. We apologize for any inconvenience.   Follow-Up: At [facility], you and your health needs are our priority.  As part of our continuing mission to provide you with exceptional heart care, our providers are all part of one team.  This team includes your primary  Cardiologist (physician) and Advanced Practice Providers or APPs (Physician Assistants and Nurse Practitioners) who all work together to provide you with the care you need, when you need it.  Your next appointment:   6 month  Provider:   Gayatri A Acharya, MD    We recommend signing up for the patient portal called MyChart.  Sign up information is provided on this After Visit Summary.  MyChart is used to connect with patients for Virtual Visits (Telemedicine).  Patients are able to view lab/test results, encounter notes, upcoming appointments, etc.  Non-urgent messages can be sent to your provider as well.   To learn more about what you can do with MyChart, go to [email or web address].   Other Instructions Please bring blood pressure log to your next visit

## 2024-01-30 NOTE — Progress Notes (Signed)
 Cardiology Office Note:  .   Date:  01/30/2024  ID:  Montre Harbor, DOB 12/07/1950, MRN [id number] PCP: Valma Lannie DELENA DEVONNA  [facility] Providers Cardiologist:  Soyla DELENA Merck, MD    History of Present Illness: .   Frederick Andrews is a 73 y.o. male with a prior history of sepsis, valvular heart disease, supraventricular tachycardia, discitis, osteomyelitis, cervical spine surgery presents for follow-up. During his hospital stay he had 2 brief episodes of atrial fibrillation and no recurrence as an outpatient, felt to be provoked by sepsis. 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 This required emergent anterior cervical decompression of C4 and C5 and C5 and C6 arthrodesis with disc stable fixation and allograft anterior plate fixation by neurosurgery. Noted to have moderate aortic valve regurgitation and severe pulmonary valve regurgitation with normal biventricular function on echocardiogram in hospital, planned for echocardiogram. Echocardiogram showed preserved LV function with EF 55%, grade 1 diastolic dysfunction, borderline abnormal strain, normal right heart with normal right heart pressure, mildly dilated LA, mild mitral valve regurgitation, mild to moderate aortic valve regurgitation, and severe pulmonary valve regurgitation, overall unchanged.  Discussed the use of AI scribe software for clinical note transcription with the patient, who gave verbal consent to proceed.  History of Present Illness Frederick Andrews is a 73 year old male with sepsis, valvular heart disease, supraventricular tachycardia, discitis, osteomyelitis, and cervical spine surgery, presents for a follow-up visit. He is accompanied by his son.  He has hypertension, with higher readings in the mornings, and takes losartan  in the afternoon. He does not regularly monitor his blood pressure post-medication but reports it as 'pretty  normal' when checked. He takes cholesterol medication daily.  He experiences no shortness of breath, chest pain, or pressure.    ROS: negative except per HPI above.  Studies Reviewed: SABRA   EKG Interpretation Date/Time:  Friday January 30 2024 09:56:32 EDT Ventricular Rate:  62 PR Interval:  162 QRS Duration:  100 QT Interval:  392 QTC Calculation: 397 R Axis:   -53  Text Interpretation: Normal sinus rhythm Left axis deviation Cannot rule out Anterior infarct , age undetermined Confirmed by Merck Soyla ([id number]) on 01/30/2024 10:15:56 AM    Results DIAGNOSTIC Echocardiogram: Valvular regurgitation Risk Assessment/Calculations:       Physical Exam:   VS:  BP (!) 170/70   Pulse 62   Ht 5' 4 (1.626 m)   Wt 128 lb 12.8 oz (58.4 kg)   SpO2 98%   BMI 22.11 kg/m    Wt Readings from Last 3 Encounters:  01/30/24 128 lb 12.8 oz (58.4 kg)  01/06/24 128 lb (58.1 kg)  12/12/23 124 lb (56.2 kg)     Physical Exam GENERAL: Alert, cooperative, well developed, no acute distress HEENT: Normocephalic, normal oropharynx, moist mucous membranes CHEST: Clear to auscultation bilaterally, No wheezes, rhonchi, or crackles CARDIOVASCULAR: Normal heart rate and rhythm, S1 and S2 normal without murmurs ABDOMEN: Soft, non-tender, non-distended, without organomegaly, Normal bowel sounds EXTREMITIES: No cyanosis or edema NEUROLOGICAL: Cranial nerves grossly intact, Moves all extremities without gross motor or sensory deficit   ASSESSMENT AND PLAN: .    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  No acute symptoms. Echo shows normal EF, 55%, G1DDX, normal RV size/function/rvsp, mild MR, mild mod AI, severe PI, all stable from prior.  - Schedule echocardiogram in six months which will be  1 year from prior echo.  - Follow-up appointment post-echocardiogram to discuss results. If stable, plan annual echo and f/u.   Essential hypertension Blood  pressure elevated today, typically higher in the morning, normalizes post-losartan . No dyspnea or chest pain. - Record blood pressure readings post-losartan  in a notebook for next appointment. - Continue losartan  50 mg daily as prescribed.  HLD CAC - continue atorvastatin  40 mg daily.   Atrial fibrillation - no recurrence by EKG or symptoms. Observe. Not on AC due to brief duration and felt to be provoked by critical illness.      Soyla Merck, MD, FACC

## 2024-03-12 ENCOUNTER — Ambulatory Visit

## 2024-03-12 ENCOUNTER — Telehealth: Payer: Self-pay

## 2024-03-12 VITALS — BP 138/60 | HR 78 | Temp 97.8°F | Ht 64.0 in | Wt 128.0 lb

## 2024-03-12 DIAGNOSIS — R911 Solitary pulmonary nodule: Secondary | ICD-10-CM

## 2024-03-12 DIAGNOSIS — R942 Abnormal results of pulmonary function studies: Secondary | ICD-10-CM

## 2024-03-12 DIAGNOSIS — Z8679 Personal history of other diseases of the circulatory system: Secondary | ICD-10-CM | POA: Diagnosis not present

## 2024-03-12 DIAGNOSIS — J42 Unspecified chronic bronchitis: Secondary | ICD-10-CM | POA: Diagnosis not present

## 2024-03-12 DIAGNOSIS — Z720 Tobacco use: Secondary | ICD-10-CM

## 2024-03-12 DIAGNOSIS — F17210 Nicotine dependence, cigarettes, uncomplicated: Secondary | ICD-10-CM | POA: Diagnosis not present

## 2024-03-12 NOTE — Patient Instructions (Addendum)
 It was a pleasure to see you today. Your will receive a call for your CT chest scheduling.  We will review your scans in next visit

## 2024-03-12 NOTE — Telephone Encounter (Signed)
 Imaging has been requested urgently from [facility] via power share.

## 2024-03-12 NOTE — Progress Notes (Signed)
 New Patient Pulmonology Office Visit   Subjective:  Patient ID: Frederick Andrews, male    DOB: 09/10/1950  MRN: [id number]  Referred by: Valma Lannie DELENA DEVONNA  CC:  Chief Complaint  Patient presents with   Consult    Recent PET- SOB with exertion occ.     HPI Frederick Andrews is a 73 y.o. male who is here to establish care Pt speaks Vietnamese and some English; pt's daughter assisted with history.  Discussed the use of AI scribe software for clinical note transcription with the patient, who gave verbal consent to proceed.  History of Present Illness Frederick Andrews is a 73 year old male who presents for a second opinion regarding a lung nodule and recent PET scan findings. He is accompanied by his daughter. He was referred to a lung specialist in [place] for further evaluation of his lung nodule identified in screening CT chest 01/2024.   A lung nodule was detected during his annual lung cancer screening CT scan, which was not present in the previous year's scan. A recent PET scan on February 14, 2024, showed a 14 mm non-calcified nodule in the left lower lobe with an SUV of 2.1, and hypermetabolic lymph nodes were noted. He has a significant smoking history, having smoked since his time in [place], with a brief cessation period of five to six years. He quit smoking three to four months ago. He has a history of emphysema noted in a CT scan from July 2024 within our system.  He was seen by Dr Edmond [facility] clinic on Oct 24. As per their records, a tumor board discussion was held and recommendations were made for surveillance imaging. Pt has  CT chest planned for January- daughter didn't feel comfortable waiting that long, hence requested this visit for second opinion. No pneumonia-like symptoms, increased cough, or wheezing beyond his usual baseline at the time of CT chest. Also daughter is planning to bring the patient closer to her in [place], hence wanted to switch to  [facility] system  He was hospitalized in 2024 for an E. coli infection and underwent spinal surgery. He was on antibiotics for a year, ending in July 2025. He has a history of atrial fibrillation and is on aspirin  as a blood thinner. He experiences occasional breathing difficulties during exertion but does not have constant breathing issues. He uses an inhaler, Breztri, twice daily - recently switched to Breztri at prior pulmonology clinic.  He does not require supplemental oxygen.       ROS Review of symptoms negative except mentioned above   Allergies: Rocephin  [ceftriaxone ] and Cefadroxil   Current Outpatient Medications:    albuterol  (VENTOLIN  HFA) 108 (90 Base) MCG/ACT inhaler, Inhale 2 puffs into the lungs as needed for wheezing., Disp: , Rfl:    atorvastatin  (LIPITOR) 40 MG tablet, Take 1 tablet (40 mg total) by mouth daily., Disp: 90 tablet, Rfl: 1   fluticasone-salmeterol (ADVAIR) 250-50 MCG/ACT AEPB, USE 1 INHALATION BY MOUTH TWICE DAILY, Disp: , Rfl:    losartan  (COZAAR ) 50 MG tablet, Take 50 mg by mouth daily., Disp: , Rfl:    tamsulosin  (FLOMAX ) 0.4 MG CAPS capsule, TAKE 1 CAPSULE(0.4 MG) BY MOUTH TWICE DAILY, Disp: 60 capsule, Rfl: 11 Past Medical History:  Diagnosis Date   Hyperlipemia    Hypertension    Past Surgical History:  Procedure Laterality Date   ANTERIOR CERVICAL DECOMPRESSION FOR EPIDURAL ABSCESS N/A 11/25/2022   Procedure: ANTERIOR CERVICAL DECOMPRESSION FUSION FOR EPIDURAL  ABSCESS CERVICAL FOUR-FIVE AND CERVICAL FIVE-SIX;  Surgeon: Colon Shove, MD;  Location: [facility] OR;  Service: Neurosurgery;  Laterality: N/A;   Family History  Problem Relation Age of Onset   Heart disease Mother    Hypertension Mother    Hyperlipidemia Mother    Hyperlipidemia Other    Social History   Socioeconomic History   Marital status: Married    Spouse name: Not on file   Number of children: Not on file   Years of education: Not on file   Highest education level: Not on file   Occupational History   Not on file  Tobacco Use   Smoking status: Every Day    Current packs/day: 1.00    Average packs/day: 1 pack/day for 40.0 years (40.0 ttl pk-yrs)    Types: Cigarettes   Smokeless tobacco: Not on file   Tobacco comments:    Quit smoking 10/2023  Vaping Use   Vaping status: Never Used  Substance and Sexual Activity   Alcohol use: No    Alcohol/week: 0.0 standard drinks of alcohol   Drug use: No   Sexual activity: Not Currently    Partners: Female    Comment: married  Other Topics Concern   Not on file  Social History Narrative   Not on file   Social Drivers of Health   Financial Resource Strain: Low Risk  (10/21/2023)   Received from [facility]   Overall Financial Resource Strain (CARDIA)    Difficulty of Paying Living Expenses: Not hard at all  Food Insecurity: No Food Insecurity (10/21/2023)   Received from [facility]   Hunger Vital Sign    Within the past 12 months, you worried that your food would run out before you got the money to buy more.: Never true    Within the past 12 months, the food you bought just didn't last and you didn't have money to get more.: Never true  Transportation Needs: No Transportation Needs (10/21/2023)   Received from [facility] - Transportation    Lack of Transportation (Medical): No    Lack of Transportation (Non-Medical): No  Physical Activity: Insufficiently Active (10/21/2023)   Received from [facility]   Exercise Vital Sign    On average, how many days per week do you engage in moderate to strenuous exercise (like a brisk walk)?: 1 day    On average, how many minutes do you engage in exercise at this level?: 10 min  Stress: No Stress Concern Present (10/21/2023)   Received from [facility] - Occupational Stress Questionnaire    Feeling of Stress : Not at all  Social Connections: Moderately Integrated (10/21/2023)   Received from [facility]    Social Network    How would you rate your social network (family, work, friends)?: Adequate participation with social networks  Intimate Partner Violence: Not At Risk (10/21/2023)   Received from [facility]   HITS    Over the last 12 months how often did your partner physically hurt you?: Never    Over the last 12 months how often did your partner insult you or talk down to you?: Never    Over the last 12 months how often did your partner threaten you with physical harm?: Never    Over the last 12 months how often did your partner scream or curse at you?: Never         Objective:  BP 138/60 (BP  Location: Left Arm, Cuff Size: Normal)   Pulse 78   Temp 97.8 F (36.6 C) (Oral)   Ht 5' 4 (1.626 m)   Wt 128 lb (58.1 kg)   SpO2 99%   BMI 21.97 kg/m    Physical Exam Constitutional:      General: He is not in acute distress.    Appearance: Normal appearance.  HENT:     Mouth/Throat:     Mouth: Mucous membranes are moist.  Cardiovascular:     Rate and Rhythm: Normal rate.  Pulmonary:     Effort: No respiratory distress.     Breath sounds: No wheezing or rales.  Musculoskeletal:     Right lower leg: No edema.     Left lower leg: No edema.  Skin:    General: Skin is warm.  Neurological:     Mental Status: He is alert and oriented to person, place, and time.  Psychiatric:        Mood and Affect: Mood normal.     Diagnostic Review:    Pft     No data to display               Results 02/06/24, LDCT, Patent central airways. No pleural effusion or pneumothorax. Left upper lobe and left lower lobe calcified granulomas.1. Left lower lobe nodule measuring up to 1.8 cm. Small adjacent nodules are seen measuring 8 mm. 2. Small left upper lobe nodules measuring 2-3 mm PET Scan 02/14/24:, Multiple small hypermetabolic mediastinal and bilateral hilar lymph nodes: Right hilar max SUV 4.9. Left hilar max SUV 3.6. Subcarinal max SUV 4.5. Left lung base noncalcified 14 mm  nodularity with low-level activity max SUV 2.1. The adjacent satellite nodules are not well visualized due to motion artifact but too small to characterize by PET. Pulmonary emphysema.   Spirometry at [facility] severe obstruction with submaximal expiratory effort. Best effort recorded.  FEV1: 38.8 % FVC: 71.9 % FEV1/FVC: 54.0 % DLCO 53.2 %     CT scan: Emphysema, no lung nodule detected (10/2022)      Assessment & Plan:   Assessment & Plan Lung nodule 14-18 mm left lower lung nodule SUV 2.1 - first identified in Oct 2025 screening scan. Pt denies infectious symptoms at that time Will plan for a follow up scan in 6-8 weeks after the initial scan- which will be 2-3 weeks from now. Given mediastinal involvement on recent PET, will plan to repeat imaging in shorter duration rather than 3 months to rule out infection. If the lesion is persistent or growing in size, will need to consider biopsy I explained the bronchoscopic biopsy procedure at depth.  We discussed about other potential options including continued follow up, CT guided biopsy, surgical biopsy. Given mediastinal uptake, bronchoscopic biopsy may be preferred. Risks and benefits of bronchoscopy +/- biopsy discussed with the patient and his family in details and all the questions were answered. They understand the risk of bleeding, pneumothorax, injury to blood vessels and would like to proceed for the bronchoscopy with biopsy. Will request prior images from [facility] and compare with the prior ones and compare with the new one scheduled in few weeks from now Orders:   CT SUPER D CHEST WO CONTRAST; Future  Chronic bronchitis, unspecified chronic bronchitis type (HCC) Symptoms well controlled Recent PFT results reviewed Will continue breztri  Orders:   CT SUPER D CHEST WO CONTRAST; Future  Tobacco abuse Congratulated on quitting smoking Orders:   CT SUPER D CHEST WO  CONTRAST; Future  History of atrial fibrillation Follows with  [facility] Uses aspirin  as per the daughter    Abnormal PET scan of lung Will request outside records and view the actual images once available      Thank you for the opportunity to take part in the care of Ysidro Glover  I personally spent a total of 60  minutes in the care of the patient today including getting/reviewing separately obtained history, performing a medically appropriate exam/evaluation, counseling and educating, placing orders, documenting clinical information in the EHR, and coordinating care.   Return in about 12 days (around 03/24/2024).   Tasmia Blumer Pleas, MD [facility] Office: [phone]

## 2024-03-17 ENCOUNTER — Ambulatory Visit (HOSPITAL_BASED_OUTPATIENT_CLINIC_OR_DEPARTMENT_OTHER)
Admission: RE | Admit: 2024-03-17 | Discharge: 2024-03-17 | Disposition: A | Discharge status: Home / Self Care | Source: Ambulatory Visit

## 2024-03-17 DIAGNOSIS — Z720 Tobacco use: Secondary | ICD-10-CM | POA: Diagnosis present

## 2024-03-17 DIAGNOSIS — R911 Solitary pulmonary nodule: Secondary | ICD-10-CM | POA: Insufficient documentation

## 2024-03-17 DIAGNOSIS — J42 Unspecified chronic bronchitis: Secondary | ICD-10-CM | POA: Diagnosis present

## 2024-03-22 ENCOUNTER — Ambulatory Visit: Payer: Self-pay

## 2024-03-22 NOTE — Telephone Encounter (Signed)
Please advise on ct scan

## 2024-04-08 ENCOUNTER — Inpatient Hospital Stay
Admission: RE | Admit: 2024-04-08 | Discharge: 2024-04-08 | Disposition: A | Discharge status: Home / Self Care | Payer: Self-pay | Source: Ambulatory Visit

## 2024-04-08 DIAGNOSIS — R911 Solitary pulmonary nodule: Secondary | ICD-10-CM

## 2024-04-08 NOTE — Telephone Encounter (Signed)
 Imaging has not been uploaded.  I spoke to [place] with [employer]. She stated that she would upload images to pacs.

## 2024-04-13 ENCOUNTER — Ambulatory Visit

## 2024-04-13 ENCOUNTER — Telehealth: Payer: Self-pay

## 2024-04-13 VITALS — BP 112/60 | HR 94 | Temp 97.9°F | Ht 62.0 in | Wt 130.4 lb

## 2024-04-13 DIAGNOSIS — Z87891 Personal history of nicotine dependence: Secondary | ICD-10-CM

## 2024-04-13 DIAGNOSIS — J439 Emphysema, unspecified: Secondary | ICD-10-CM

## 2024-04-13 DIAGNOSIS — R918 Other nonspecific abnormal finding of lung field: Secondary | ICD-10-CM

## 2024-04-13 MED ORDER — BREZTRI AEROSPHERE 160-9-4.8 MCG/ACT IN AERO: 2.0000 | INHALATION_SPRAY | Freq: Two times a day (BID) | RESPIRATORY_TRACT | 5 refills | Status: AC

## 2024-04-13 NOTE — Telephone Encounter (Signed)
 Filled out form for [employer] DOS: 02/06/2024

## 2024-04-13 NOTE — Patient Instructions (Signed)
 It was a pleasure to see you today. Your will receive a call for your CT chest scheduling.

## 2024-04-13 NOTE — Progress Notes (Unsigned)
 New Patient Pulmonology Office Visit   Subjective:  Patient ID: Frederick Andrews, male    DOB: 17-Dec-1950  MRN: [id number]  Referred by: Valma Lannie LABOR, PA-C  CC:  Chief Complaint  Patient presents with   Bronchitis    Pt states since LOV breathing has been okay SOB occurs when weather is ready cold     HPI Wenceslaus Andrews is a 73 y.o. male who is here to establish care Pt speaks Vietnamese and some English; pt's daughter assisted with history.  Discussed the use of AI scribe software for clinical note transcription with the patient, who gave verbal consent to proceed.  History of Present Illness Frederick Andrews is a 73 year old male who presents for a second opinion regarding a lung nodule and recent PET scan findings. He is accompanied by his daughter. He was referred to a lung specialist in [place] for further evaluation of his lung nodule identified in screening CT chest 01/2024.   A lung nodule was detected during his annual lung cancer screening CT scan, which was not present in the previous year's scan. A recent PET scan on February 14, 2024, showed a 14 mm non-calcified nodule in the left lower lobe with an SUV of 2.1, and hypermetabolic lymph nodes were noted. He has a significant smoking history, having smoked since his time in [place], with a brief cessation period of five to six years. He quit smoking three to four months ago. He has a history of emphysema noted in a CT scan from July 2024 within our system.  He was seen by Dr Edmond [facility] on Oct 24. As per their records, a tumor board discussion was held and recommendations were made for surveillance imaging. Pt has  CT chest planned for January- daughter didn't feel comfortable waiting that long, hence requested this visit for second opinion. No pneumonia-like symptoms, increased cough, or wheezing beyond his usual baseline at the time of CT chest. Also daughter is planning to bring the patient closer to her  in [place], hence wanted to switch to [facility] system  He was hospitalized in 2024 for an E. coli infection and underwent spinal surgery. He was on antibiotics for a year, ending in July 2025. He has a history of atrial fibrillation and is on aspirin  as a blood thinner. He experiences occasional breathing difficulties during exertion but does not have constant breathing issues. He uses an inhaler, Breztri , twice daily - recently switched to Breztri  at prior pulmonology clinic.  He does not require supplemental oxygen.       ROS Review of symptoms negative except mentioned above   Allergies: Rocephin  [ceftriaxone ] and Cefadroxil   Current Outpatient Medications:    atorvastatin  (LIPITOR) 40 MG tablet, Take 1 tablet (40 mg total) by mouth daily., Disp: 90 tablet, Rfl: 1   losartan  (COZAAR ) 50 MG tablet, Take 50 mg by mouth daily., Disp: , Rfl:    Multiple Vitamin (MULTIVITAMIN) tablet, Take 1 tablet by mouth daily., Disp: , Rfl:    tamsulosin  (FLOMAX ) 0.4 MG CAPS capsule, TAKE 1 CAPSULE(0.4 MG) BY MOUTH TWICE DAILY, Disp: 60 capsule, Rfl: 11   albuterol  (VENTOLIN  HFA) 108 (90 Base) MCG/ACT inhaler, Inhale 2 puffs into the lungs as needed for wheezing. (Patient not taking: Reported on 04/13/2024), Disp: , Rfl:    BREZTRI  AEROSPHERE 160-9-4.8 MCG/ACT AERO inhaler, Inhale 2 puffs into the lungs 2 (two) times daily., Disp: 32.1 g, Rfl: 5 Past Medical History:  Diagnosis Date  Hyperlipemia    Hypertension    Past Surgical History:  Procedure Laterality Date   ANTERIOR CERVICAL DECOMPRESSION FOR EPIDURAL ABSCESS N/A 11/25/2022   Procedure: ANTERIOR CERVICAL DECOMPRESSION FUSION FOR EPIDURAL ABSCESS CERVICAL FOUR-FIVE AND CERVICAL FIVE-SIX;  Surgeon: Colon Shove, MD;  Location: [facility] OR;  Service: Neurosurgery;  Laterality: N/A;   Family History  Problem Relation Age of Onset   Heart disease Mother    Hypertension Mother    Hyperlipidemia Mother    Hyperlipidemia Other    Social  History   Socioeconomic History   Marital status: Married    Spouse name: Not on file   Number of children: Not on file   Years of education: Not on file   Highest education level: Not on file  Occupational History   Not on file  Tobacco Use   Smoking status: Every Day    Current packs/day: 1.00    Average packs/day: 1 pack/day for 40.0 years (40.0 ttl pk-yrs)    Types: Cigarettes   Smokeless tobacco: Not on file   Tobacco comments:    Quit smoking 10/2023  Vaping Use   Vaping status: Never Used  Substance and Sexual Activity   Alcohol use: No    Alcohol/week: 0.0 standard drinks of alcohol   Drug use: No   Sexual activity: Not Currently    Partners: Female    Comment: married  Other Topics Concern   Not on file  Social History Narrative   Not on file   Social Drivers of Health   Tobacco Use: High Risk (04/13/2024)   Patient History    Smoking Tobacco Use: Every Day    Smokeless Tobacco Use: Unknown    Passive Exposure: Not on file  Financial Resource Strain: Low Risk (10/21/2023)   Received from [facility]   Overall Financial Resource Strain (CARDIA)    Difficulty of Paying Living Expenses: Not hard at all  Food Insecurity: No Food Insecurity (10/21/2023)   Received from [facility]   Epic    Within the past 12 months, you worried that your food would run out before you got the money to buy more.: Never true    Within the past 12 months, the food you bought just didn't last and you didn't have money to get more.: Never true  Transportation Needs: No Transportation Needs (10/21/2023)   Received from [facility] - Transportation    Lack of Transportation (Medical): No    Lack of Transportation (Non-Medical): No  Physical Activity: Insufficiently Active (10/21/2023)   Received from [facility]   Exercise Vital Sign    On average, how many days per week do you engage in moderate to strenuous exercise (like a brisk walk)?: 1 day    On average, how  many minutes do you engage in exercise at this level?: 10 min  Stress: No Stress Concern Present (10/21/2023)   Received from [facility] - Occupational Stress Questionnaire    Feeling of Stress : Not at all  Social Connections: Moderately Integrated (10/21/2023)   Received from [facility]   Social Network    How would you rate your social network (family, work, friends)?: Adequate participation with social networks  Intimate Partner Violence: Not At Risk (10/21/2023)   Received from [facility]   HITS    Over the last 12 months how often did your partner physically hurt you?: Never    Over the last 12 months  how often did your partner insult you or talk down to you?: Never    Over the last 12 months how often did your partner threaten you with physical harm?: Never    Over the last 12 months how often did your partner scream or curse at you?: Never  Depression (PHQ2-9): Low Risk (03/31/2023)   Depression (PHQ2-9)    PHQ-2 Score: 0  Alcohol Screen: Not on file  Housing: Low Risk (10/21/2023)   Received from [facility]    In the last 12 months, was there a time when you were not able to pay the mortgage or rent on time?: No    In the past 12 months, how many times have you moved where you were living?: 0    At any time in the past 12 months, were you homeless or living in a shelter (including now)?: No  Utilities: Not At Risk (10/21/2023)   Received from [facility] Utilities    Threatened with loss of utilities: No  Health Literacy: Not on file         Objective:  BP 112/60   Pulse 94   Temp 97.9 F (36.6 C) (Oral)   Ht 5' 2 (1.575 m) Comment: per patient  Wt 130 lb 6.4 oz (59.1 kg)   SpO2 97% Comment: on RA  BMI 23.85 kg/m    Physical Exam Constitutional:      General: He is not in acute distress.    Appearance: Normal appearance.  HENT:     Mouth/Throat:     Mouth: Mucous membranes are moist.   Cardiovascular:     Rate and Rhythm: Normal rate.  Pulmonary:     Effort: No respiratory distress.     Breath sounds: No wheezing or rales.  Musculoskeletal:     Right lower leg: No edema.     Left lower leg: No edema.  Skin:    General: Skin is warm.  Neurological:     Mental Status: He is alert and oriented to person, place, and time.  Psychiatric:        Mood and Affect: Mood normal.     Diagnostic Review:    Pft     No data to display               Results 02/06/24, LDCT, Patent central airways. No pleural effusion or pneumothorax. Left upper lobe and left lower lobe calcified granulomas.1. Left lower lobe nodule measuring up to 1.8 cm. Small adjacent nodules are seen measuring 8 mm. 2. Small left upper lobe nodules measuring 2-3 mm PET Scan 02/14/24:, Multiple small hypermetabolic mediastinal and bilateral hilar lymph nodes: Right hilar max SUV 4.9. Left hilar max SUV 3.6. Subcarinal max SUV 4.5. Left lung base noncalcified 14 mm nodularity with low-level activity max SUV 2.1. The adjacent satellite nodules are not well visualized due to motion artifact but too small to characterize by PET. Pulmonary emphysema.   Spirometry at [facility] severe obstruction with submaximal expiratory effort. Best effort recorded.  FEV1: 38.8 % FVC: 71.9 % FEV1/FVC: 54.0 % DLCO 53.2 %     CT scan: Emphysema, no lung nodule detected (10/2022)      Assessment & Plan:   Assessment & Plan Lung nodules  Orders:   CT SUPER D CHEST WO CONTRAST; Future  Chronic obstructive pulmonary disease with emphysema, unspecified emphysema type [facility])     Former smoker       Thank you for  the opportunity to take part in the care of Ryley Peedin  I personally spent a total of 60  minutes in the care of the patient today including getting/reviewing separately obtained history, performing a medically appropriate exam/evaluation, counseling and educating, placing orders, documenting clinical  information in the EHR, and coordinating care.   No follow-ups on file.   Yitzchak Kothari Pleas, MD [facility] Office: [phone]

## 2024-05-14 ENCOUNTER — Other Ambulatory Visit: Payer: Self-pay

## 2024-05-14 ENCOUNTER — Inpatient Hospital Stay
Admission: RE | Admit: 2024-05-14 | Discharge: 2024-05-14 | Disposition: A | Discharge status: Home / Self Care | Payer: Self-pay | Source: Ambulatory Visit

## 2024-05-14 DIAGNOSIS — R918 Other nonspecific abnormal finding of lung field: Secondary | ICD-10-CM

## 2024-05-14 NOTE — Progress Notes (Signed)
 [facility] sent to [facility] for ct chest lung dated 02-06-2024

## 2024-05-24 ENCOUNTER — Telehealth: Payer: Self-pay | Admitting: Urology

## 2024-05-24 NOTE — Telephone Encounter (Signed)
 Patients daughter Vivian called and left a voicemail asking for a refill for her dad's Flomax . I call ed her back and left a voicemail that we called in a 60 day supply on 01/22/24 with 11 refills. He should have refills and for her to contact the pharmacy. If there are no more refills to call the office back.  Rosaline

## 2024-06-24 ENCOUNTER — Other Ambulatory Visit

## 2024-06-29 ENCOUNTER — Ambulatory Visit

## 2024-08-02 ENCOUNTER — Other Ambulatory Visit (HOSPITAL_COMMUNITY)

## 2024-12-10 ENCOUNTER — Ambulatory Visit: Admitting: Urology
